# Patient Record
Sex: Male | Born: 1950 | Race: White | Hispanic: No | Marital: Married | Smoking: Former smoker
Health system: Southern US, Community
[De-identification: ages and names within clinical notes are randomized; demographics above are authoritative.]

## PROBLEM LIST (undated history)

## (undated) DIAGNOSIS — E785 Hyperlipidemia, unspecified: Secondary | ICD-10-CM

## (undated) DIAGNOSIS — K5790 Diverticulosis of intestine, part unspecified, without perforation or abscess without bleeding: Secondary | ICD-10-CM

## (undated) DIAGNOSIS — K635 Polyp of colon: Secondary | ICD-10-CM

## (undated) DIAGNOSIS — M1990 Unspecified osteoarthritis, unspecified site: Secondary | ICD-10-CM

## (undated) DIAGNOSIS — N529 Male erectile dysfunction, unspecified: Secondary | ICD-10-CM

## (undated) DIAGNOSIS — K219 Gastro-esophageal reflux disease without esophagitis: Secondary | ICD-10-CM

## (undated) DIAGNOSIS — I213 ST elevation (STEMI) myocardial infarction of unspecified site: Secondary | ICD-10-CM

## (undated) DIAGNOSIS — L821 Other seborrheic keratosis: Secondary | ICD-10-CM

## (undated) HISTORY — DX: Diverticulosis of intestine, part unspecified, without perforation or abscess without bleeding: K57.90

## (undated) HISTORY — DX: Gastro-esophageal reflux disease without esophagitis: K21.9

## (undated) HISTORY — PX: NOSE SURGERY: SHX723

## (undated) HISTORY — DX: Other seborrheic keratosis: L82.1

## (undated) HISTORY — PX: OTHER SURGICAL HISTORY: SHX169

## (undated) HISTORY — DX: Polyp of colon: K63.5

## (undated) HISTORY — DX: Unspecified osteoarthritis, unspecified site: M19.90

## (undated) HISTORY — DX: Hyperlipidemia, unspecified: E78.5

## (undated) HISTORY — DX: Male erectile dysfunction, unspecified: N52.9

---
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
## 2002-01-31 ENCOUNTER — Encounter: Admission: RE | Admit: 2002-01-31 | Discharge: 2002-01-31 | Payer: Self-pay | Admitting: *Deleted

## 2008-11-16 ENCOUNTER — Ambulatory Visit: Payer: Self-pay | Admitting: Diagnostic Radiology

## 2008-11-16 ENCOUNTER — Ambulatory Visit (HOSPITAL_BASED_OUTPATIENT_CLINIC_OR_DEPARTMENT_OTHER): Admission: RE | Admit: 2008-11-16 | Discharge: 2008-11-16 | Payer: Self-pay | Admitting: Family Medicine

## 2008-11-16 IMAGING — CR DG CHEST 2V
2 series · 2 of 2 positions shown · non-contrast
Comparison: None available.

CLINICAL DATA: 58-year-old male with shortness of breath.
Hyperlipidemia.  Smoker.

CHEST - 2 VIEW

[w chest pa]
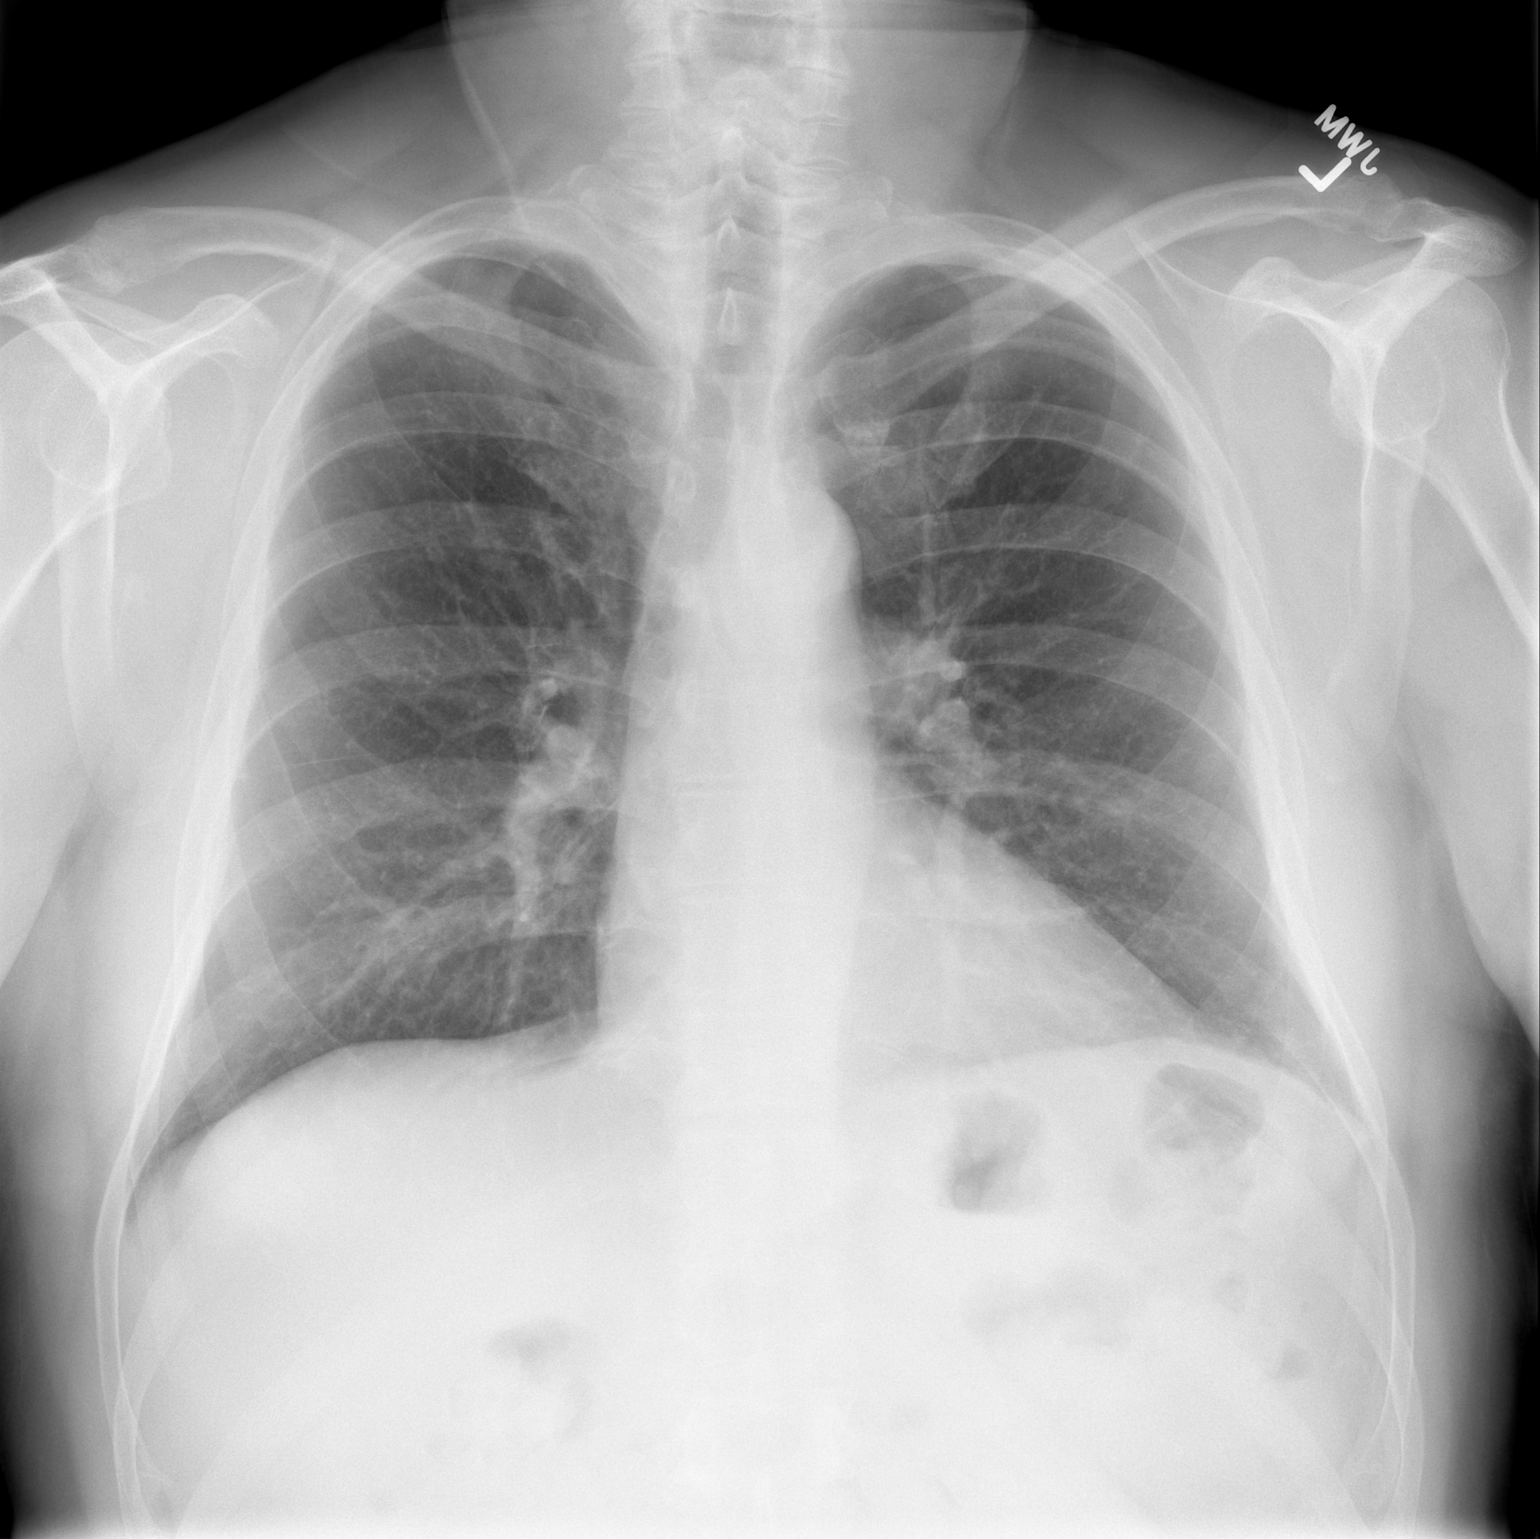

[w chest lat]
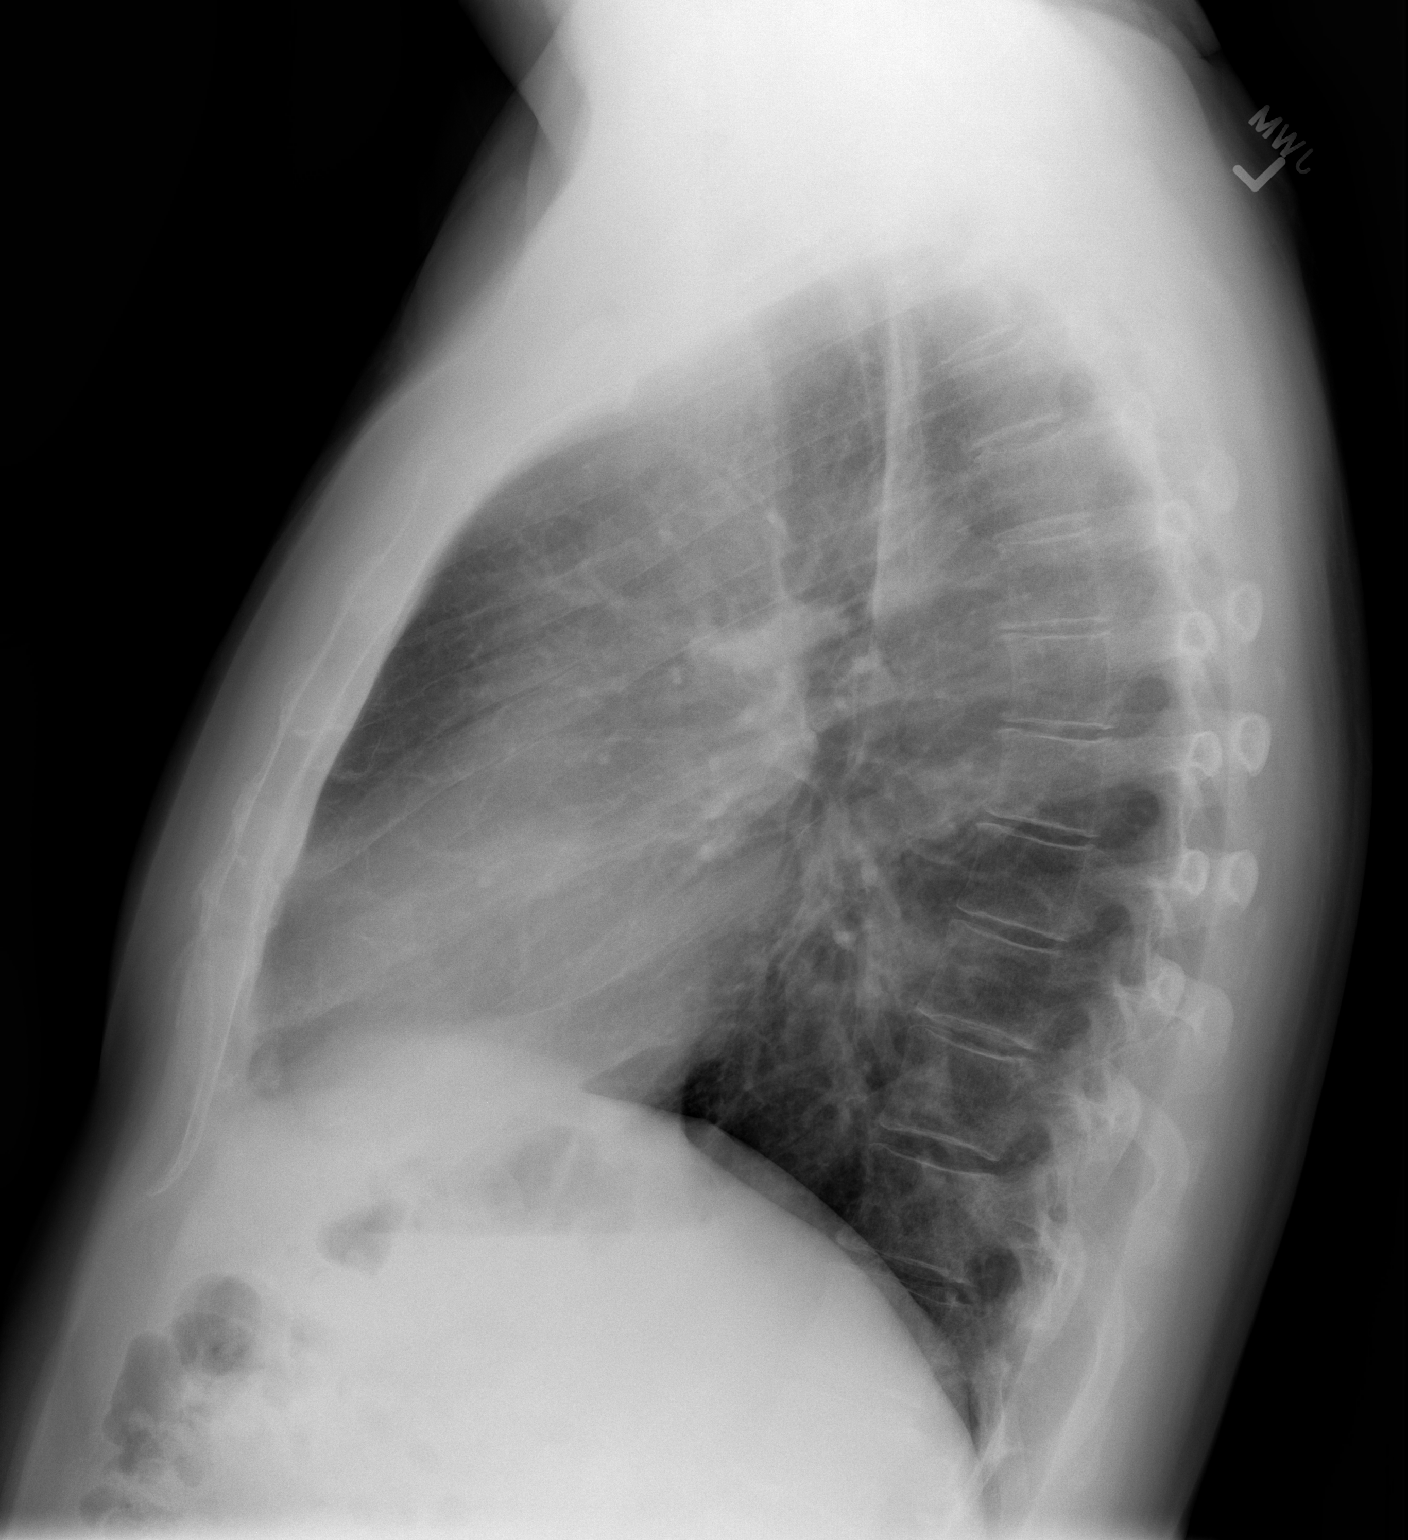

[2 of 2 positions shown; findings below may reference images not displayed]

FINDINGS: Mild diffuse increased interstitial markings.  Lung
volumes at the lower limits of normal.  No pneumothorax, pulmonary
edema, pleural effusion, or focal airspace opacity. Visualized
tracheal air column is within normal limits.   Cardiac size and
mediastinal contours are within normal limits.  Overlapping bony
shadows at the left sternoclavicular joint.  No lung mass
identified. No acute osseous abnormality identified.
IMPRESSION: Mild diffuse interstitial markings could reflect sequelae of
smoking.  Otherwise, no acute cardiopulmonary abnormality.

## 2010-01-15 ENCOUNTER — Emergency Department (HOSPITAL_BASED_OUTPATIENT_CLINIC_OR_DEPARTMENT_OTHER)
Admission: EM | Admit: 2010-01-15 | Discharge: 2010-01-15 | Payer: Self-pay | Source: Home / Self Care | Admitting: Emergency Medicine

## 2010-01-15 IMAGING — CT CT ABD-PELV W/ CM
2 of 5 series · 16 of 46 positions shown, 18 images · IV contrast (APPLIED)
Comparison: None.

CLINICAL DATA: Diarrhea.  Blood in the stool.

CT ABDOMEN AND PELVIS WITH CONTRAST
TECHNIQUE: Multidetector CT imaging of the abdomen and pelvis was
performed following the standard protocol during bolus
administration of intravenous contrast.
Contrast: 100 ml [AR]

[Series 2: abd/pelvis 5.0 b31f · axial · 0.78mm/px · z∈[-479,-24]mm · 13 of 103 slices shown, 15 images]
[im 6/103  soft-tissue]
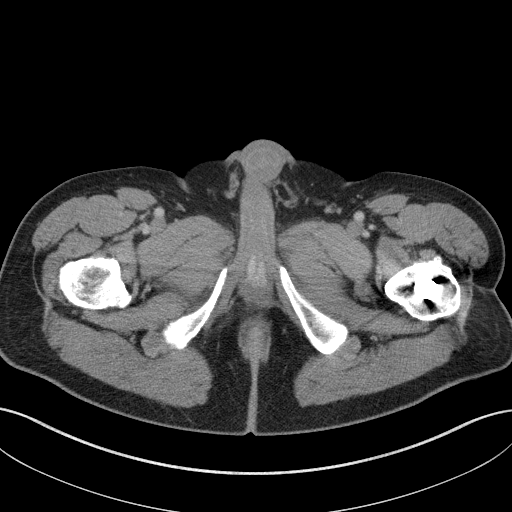
[im 6/103  bone]
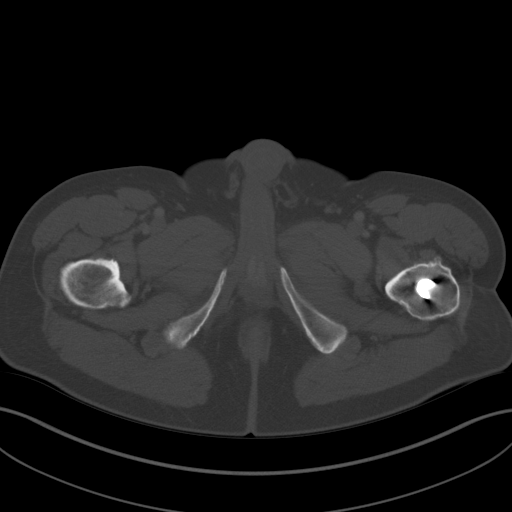
[im 17/103  soft-tissue]
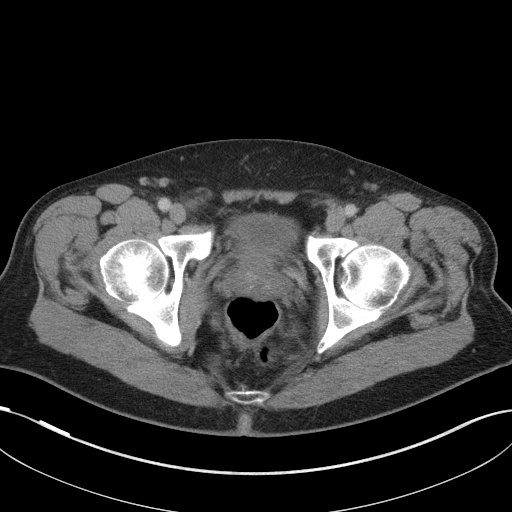
[im 22/103  soft-tissue]
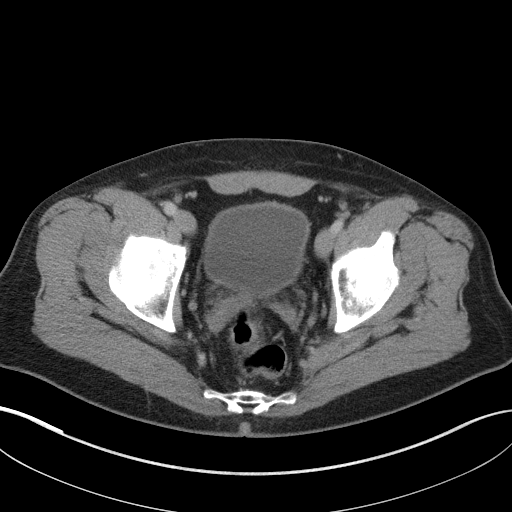
[im 27/103  soft-tissue]
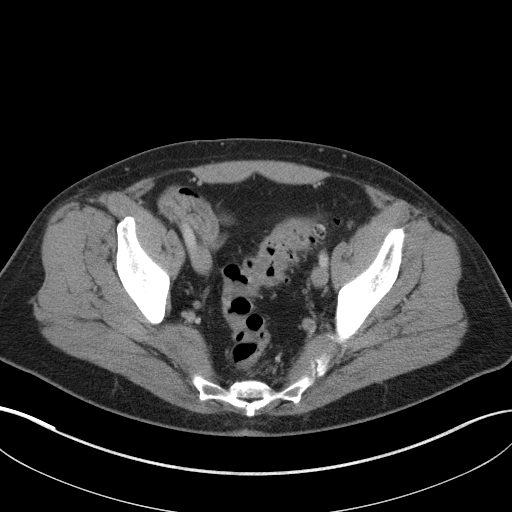
[im 38/103  soft-tissue]
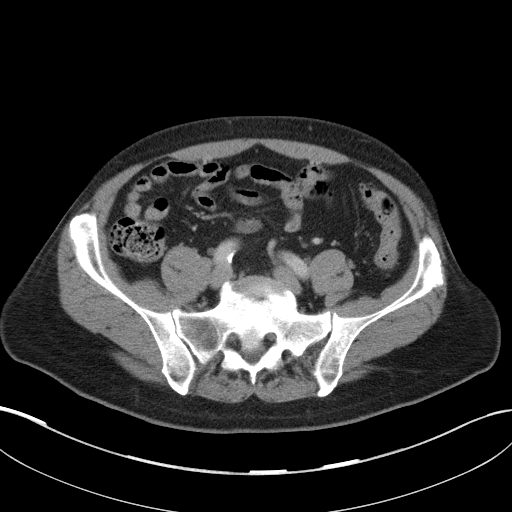
[im 43/103  soft-tissue]
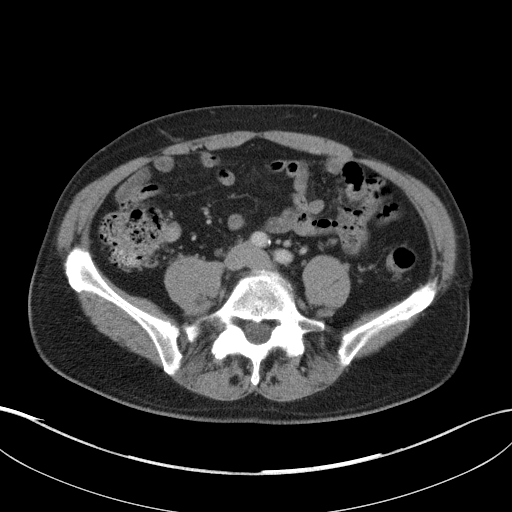
[im 54/103  soft-tissue]
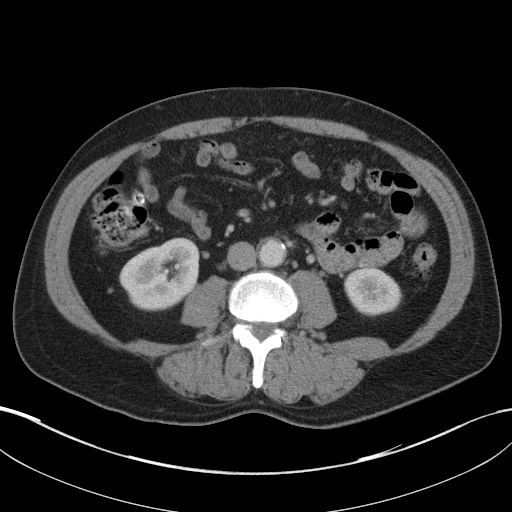
[im 60/103  soft-tissue]
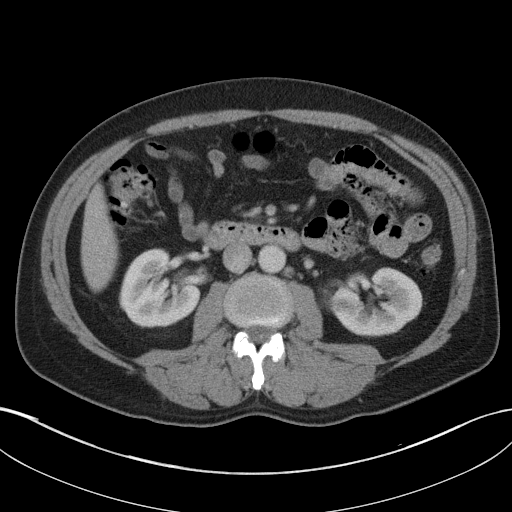
[im 65/103  soft-tissue]
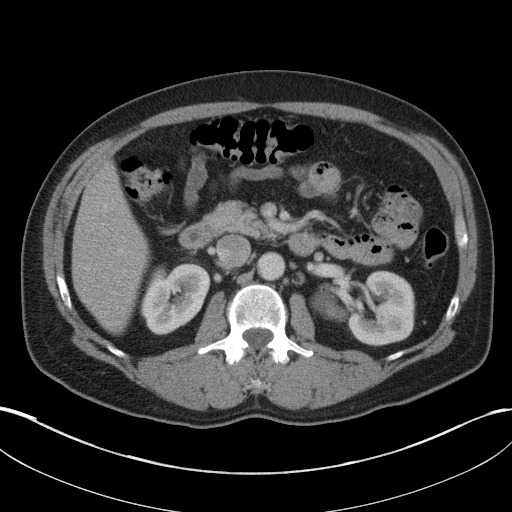
[im 65/103  bone]
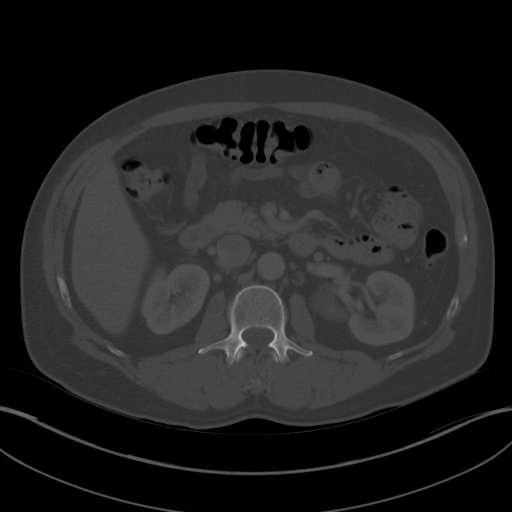
[im 76/103  soft-tissue]
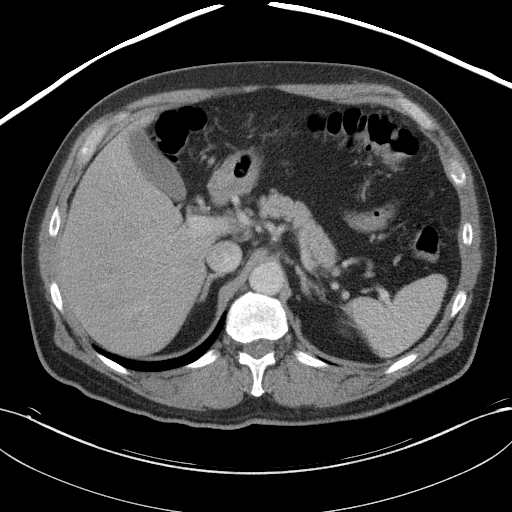
[im 81/103  soft-tissue]
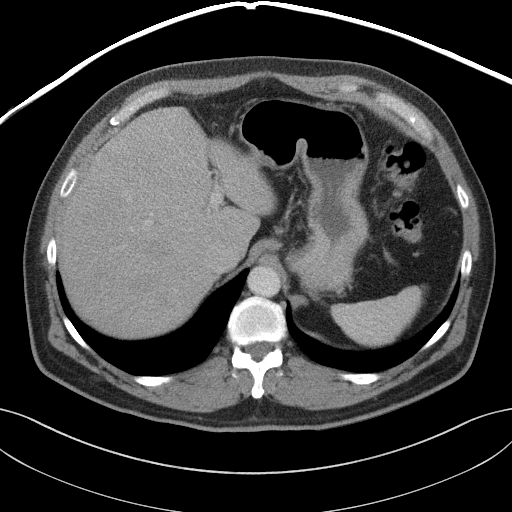
[im 86/103  soft-tissue]
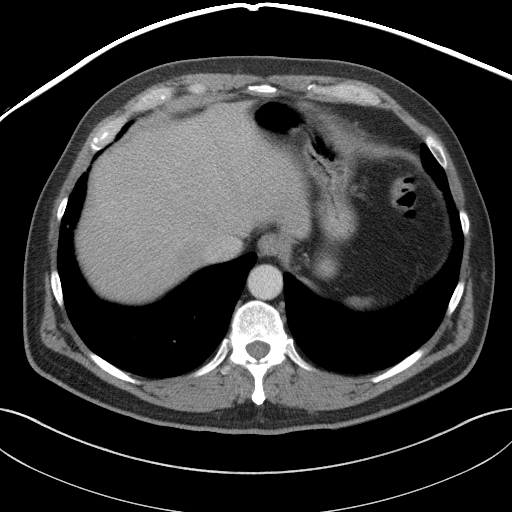
[im 97/103  soft-tissue]
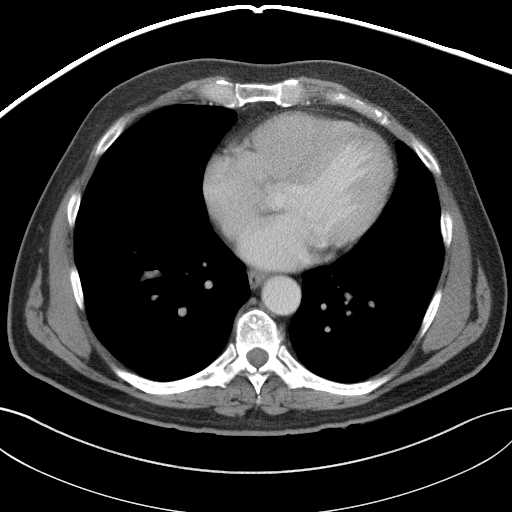

[Series 4: abd/pelvis 3.0 coronal · coronal · 0.78mm/px · 3 of 76 slices shown]
[im 26/76  soft-tissue]
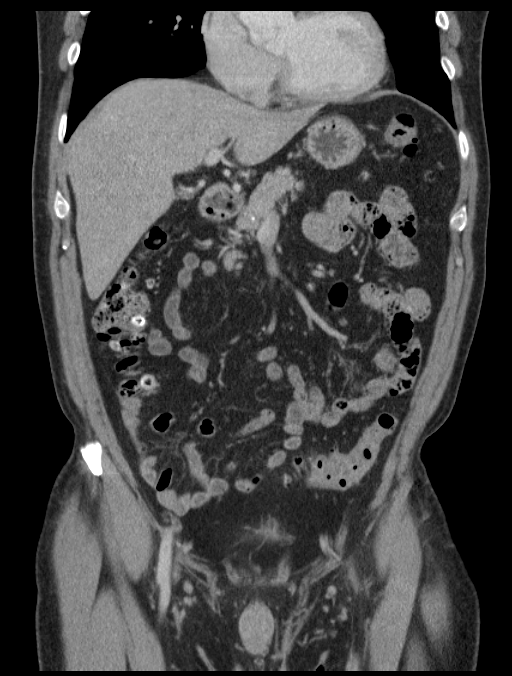
[im 34/76  soft-tissue]
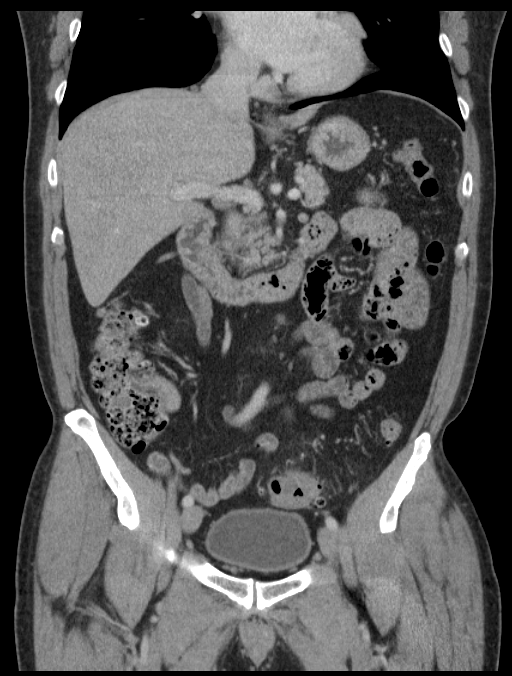
[im 42/76  soft-tissue]
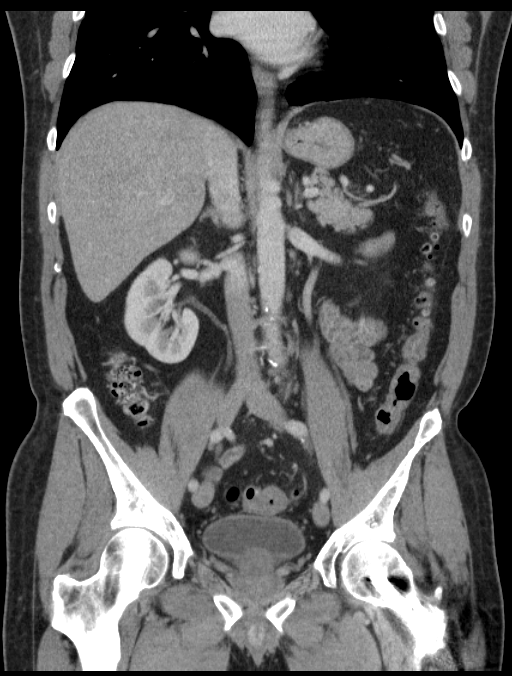

[16 of 46 positions shown; findings below may reference images not displayed]

FINDINGS: Numerous diverticuli throughout the colon, most extensive
in the sigmoid and ascending portions.  There is slight thickening
of the mucosa in the mid sigmoid region suggesting mild
diverticulitis although there is no pericolonic soft tissue
inflammation.

The liver and spleen are normal.  There are a few tiny
calcifications in the head of the pancreas suggesting prior
pancreatitis.

The adrenal glands and kidneys are normal except for a benign
appearing 3 cm cyst on the medial aspect of the midportion of the
left kidney.

The terminal ileum and appendix are normal.  There is no free air
or free fluid in the abdomen.  No acute osseous abnormalities.
IMPRESSION: 1.  Extensive diverticulosis with slight prominence of the mucosa
of the sigmoid portion the colon which could represent early
diverticulitis.
2.  Several tiny calcifications in the pancreatic head which could
represent sequela from previous pancreatitis.

## 2010-04-01 LAB — CBC
HCT: 46.1 % (ref 39.0–52.0)
Hemoglobin: 15.5 g/dL (ref 13.0–17.0)
MCH: 31.8 pg (ref 26.0–34.0)
MCHC: 33.6 g/dL (ref 30.0–36.0)
MCV: 94.7 fL (ref 78.0–100.0)
Platelets: 183 10*3/uL (ref 150–400)
RBC: 4.87 MIL/uL (ref 4.22–5.81)
RDW: 12.7 % (ref 11.5–15.5)
WBC: 9.4 10*3/uL (ref 4.0–10.5)

## 2010-04-01 LAB — URINALYSIS, ROUTINE W REFLEX MICROSCOPIC
Bilirubin Urine: NEGATIVE
Leukocytes, UA: NEGATIVE
Nitrite: NEGATIVE
Specific Gravity, Urine: 1.008 (ref 1.005–1.030)
Urobilinogen, UA: 0.2 mg/dL (ref 0.0–1.0)

## 2010-04-01 LAB — DIFFERENTIAL
Basophils Absolute: 0 10*3/uL (ref 0.0–0.1)
Basophils Relative: 0 % (ref 0–1)
Eosinophils Absolute: 0.1 10*3/uL (ref 0.0–0.7)
Eosinophils Relative: 1 % (ref 0–5)
Lymphocytes Relative: 26 % (ref 12–46)
Lymphs Abs: 2.5 10*3/uL (ref 0.7–4.0)
Monocytes Absolute: 0.9 10*3/uL (ref 0.1–1.0)
Monocytes Relative: 9 % (ref 3–12)
Neutro Abs: 6 10*3/uL (ref 1.7–7.7)
Neutrophils Relative %: 64 % (ref 43–77)

## 2010-04-01 LAB — COMPREHENSIVE METABOLIC PANEL
ALT: 21 U/L (ref 0–53)
AST: 19 U/L (ref 0–37)
Albumin: 4 g/dL (ref 3.5–5.2)
Alkaline Phosphatase: 58 U/L (ref 39–117)
BUN: 14 mg/dL (ref 6–23)
CO2: 27 mEq/L (ref 19–32)
Calcium: 8.7 mg/dL (ref 8.4–10.5)
Chloride: 108 mEq/L (ref 96–112)
Creatinine, Ser: 0.8 mg/dL (ref 0.4–1.5)
GFR calc Af Amer: >60 mL/min (ref >60)
GFR calc non Af Amer: >60 mL/min (ref >60)
Glucose, Bld: 90 mg/dL (ref 70–99)
Potassium: 3.9 mEq/L (ref 3.5–5.1)
Sodium: 143 mEq/L (ref 135–145)
Total Bilirubin: 0.9 mg/dL (ref 0.3–1.2)
Total Protein: 6.7 g/dL (ref 6.0–8.3)

## 2010-04-01 LAB — HEMOCCULT GUIAC POC 1CARD (OFFICE): Fecal Occult Bld: POSITIVE

## 2010-04-01 LAB — URINE MICROSCOPIC-ADD ON

## 2011-09-30 ENCOUNTER — Encounter: Payer: Self-pay | Admitting: *Deleted

## 2011-09-30 ENCOUNTER — Encounter: Payer: Self-pay | Admitting: Cardiology

## 2011-09-30 DIAGNOSIS — L821 Other seborrheic keratosis: Secondary | ICD-10-CM | POA: Insufficient documentation

## 2011-09-30 DIAGNOSIS — E785 Hyperlipidemia, unspecified: Secondary | ICD-10-CM | POA: Insufficient documentation

## 2011-09-30 DIAGNOSIS — K5790 Diverticulosis of intestine, part unspecified, without perforation or abscess without bleeding: Secondary | ICD-10-CM | POA: Insufficient documentation

## 2011-09-30 DIAGNOSIS — M542 Cervicalgia: Secondary | ICD-10-CM | POA: Insufficient documentation

## 2011-09-30 DIAGNOSIS — M1990 Unspecified osteoarthritis, unspecified site: Secondary | ICD-10-CM | POA: Insufficient documentation

## 2011-09-30 DIAGNOSIS — N529 Male erectile dysfunction, unspecified: Secondary | ICD-10-CM | POA: Insufficient documentation

## 2011-09-30 DIAGNOSIS — K219 Gastro-esophageal reflux disease without esophagitis: Secondary | ICD-10-CM | POA: Insufficient documentation

## 2011-09-30 DIAGNOSIS — J309 Allergic rhinitis, unspecified: Secondary | ICD-10-CM | POA: Insufficient documentation

## 2011-09-30 DIAGNOSIS — K635 Polyp of colon: Secondary | ICD-10-CM | POA: Insufficient documentation

## 2011-10-01 ENCOUNTER — Encounter: Payer: Self-pay | Admitting: Cardiology

## 2011-10-01 ENCOUNTER — Ambulatory Visit (INDEPENDENT_AMBULATORY_CARE_PROVIDER_SITE_OTHER): Payer: Managed Care, Other (non HMO) | Admitting: Cardiology

## 2011-10-01 ENCOUNTER — Ambulatory Visit: Payer: Self-pay | Admitting: Cardiology

## 2011-10-01 VITALS — BP 122/60 | HR 49 | Ht 71.0 in | Wt 212.0 lb

## 2011-10-01 DIAGNOSIS — F17200 Nicotine dependence, unspecified, uncomplicated: Secondary | ICD-10-CM

## 2011-10-01 DIAGNOSIS — Z720 Tobacco use: Secondary | ICD-10-CM | POA: Insufficient documentation

## 2011-10-01 DIAGNOSIS — I498 Other specified cardiac arrhythmias: Secondary | ICD-10-CM

## 2011-10-01 DIAGNOSIS — R001 Bradycardia, unspecified: Secondary | ICD-10-CM

## 2011-10-01 DIAGNOSIS — E785 Hyperlipidemia, unspecified: Secondary | ICD-10-CM

## 2011-10-01 NOTE — Patient Instructions (Addendum)
Your physician recommends that you schedule a follow-up appointment in: AS NEEDED PENDING TEST RESULTS  Your physician has requested that you have an exercise tolerance test. For further information please visit [email or web address] Please also follow instruction sheet, as given.    Exercise Stress Electrocardiography An exercise stress test is a heart test (EKG) which is done while you are moving. You will walk on a treadmill. This test will tell your doctor how your heart does when it is forced to work harder and how much activity you can safely handle. BEFORE THE TEST  Wear shorts or athletic pants.   Wear comfortable tennis shoes.   Women need to wear a bra that allows patches to be put on under it.  TEST  An EKG cable will be attached to your waist. This cable is hooked up to patches, which look like round stickers stuck to your chest.   You will be asked to walk on the treadmill.   You will walk until you are too tired or until you are told to stop.   Tell the doctor right away if you have:   Chest pain.   Leg cramps.   Shortness of breath.   Dizziness.   The test may last 30 minutes to 1 hour. The timing depends on your physical condition and the condition of your heart.  AFTER THE TEST  You will rest for about 6 minutes. During this time, your heart rhythm and blood pressure will be checked.   The testing equipment will be removed from your body and you can get dressed.   You may go home or back to your hospital room. You may keep doing all your usual activities as told by your doctor.

## 2011-10-01 NOTE — Assessment & Plan Note (Signed)
Patient has no symptoms from his bradycardia. There is no chest pain, dyspnea or fatigue. No history of syncope. No further therapy indicated. I will arrange an exercise treadmill given his risk factors. I would like to risk stratify as he plans to begin an exercise program in the near future. This will also demonstrate chronotropic competence.

## 2011-10-01 NOTE — Progress Notes (Signed)
  HPI: 61 year old male with no prior cardiac history for evaluation of bradycardia. Laboratories on 08/25/2011 showed a potassium of 4.8 and normal renal function. TSH normal at 0.734. Patient has been noted to be bradycardic for quite some time. However he denies dyspnea on exertion, orthopnea, PND, pedal edema, palpitations, syncope or chest pain. Patient denies fatigue.  Current Outpatient Prescriptions  Medication Sig Dispense Refill  . aspirin 81 MG tablet Take 81 mg by mouth daily.      . Cholecalciferol (VITAMIN D) 2000 UNITS CAPS Take 1 capsule by mouth daily.      Marland Kitchen HYDROcodone-acetaminophen (NORCO/VICODIN) 5-325 MG per tablet prn      . neomycin-polymyxin-hydrocortisone (CORTISPORIN) otic solution prn      . simvastatin (ZOCOR) 80 MG tablet Take 40 mg by mouth at bedtime.      . TESTOSTERONE IM Inject into the muscle. Every 2 weeks      . VERAMYST 27.5 MCG/SPRAY nasal spray prn        Allergies  Allergen Reactions  . Penicillins     Past Medical History  Diagnosis Date  . Diverticular disease   . Hyperlipidemia   . Allergic rhinitis   . Allergic sinusitis   . GERD (gastroesophageal reflux disease)   . Seborrheic keratosis   . Osteoarthritis   . Erectile dysfunction   . Colon polyp     Past Surgical History  Procedure Date  . Leg surgery   . Athroscopic knee surgery   . Nose surgery     History   Social History  . Marital Status: Married    Spouse Name: N/A    Number of Children: 2  . Years of Education: N/A   Occupational History  .      Retired   Social History Main Topics  . Smoking status: Current Every Day Smoker  . Smokeless tobacco: Not on file  . Alcohol Use: Yes     Occasional  . Drug Use: Not on file  . Sexually Active: Not on file   Other Topics Concern  . Not on file   Social History Narrative  . No narrative on file    Family History  Problem Relation Age of Onset  . Coronary artery disease Brother     MI    ROS:   Arthralgias and back pain but no fevers or chills, productive cough, hemoptysis, dysphasia, odynophagia, melena, hematochezia, dysuria, hematuria, rash, seizure activity, orthopnea, PND, pedal edema, claudication. Remaining systems are negative.  Physical Exam:  Blood pressure 122/60, pulse 49, height 5\' 11"  (1.803 m), weight 212 lb (96.163 kg).  General:  Well developed/well nourished in NAD Skin warm/dry Patient not depressed No peripheral clubbing Back-normal HEENT-normal/normal eyelids Neck supple/normal carotid upstroke bilaterally; no bruits; no JVD; no thyromegaly chest - CTA/ normal expansion CV - bradycardic but regular rhythm/normal S1 and S2; no murmurs, rubs or gallops;  PMI nondisplaced Abdomen -NT/ND, no HSM, no mass, + bowel sounds, no bruit 2+ femoral pulses, no bruits Ext-no edema, chords, 2+ DP Neuro-grossly nonfocal  ECG 09/15/2011-sinus bradycardia with RV conduction delay. Short PR interval. Repeat ECG today shows sinus rhythm at a rate of 49. Left ventricular hypertrophy.

## 2011-10-01 NOTE — Assessment & Plan Note (Signed)
Management per primary care. 

## 2011-10-01 NOTE — Assessment & Plan Note (Signed)
Patient counseled on discontinuing. 

## 2011-10-03 ENCOUNTER — Encounter: Payer: Self-pay | Admitting: Cardiology

## 2011-10-27 ENCOUNTER — Ambulatory Visit (INDEPENDENT_AMBULATORY_CARE_PROVIDER_SITE_OTHER): Payer: Managed Care, Other (non HMO) | Admitting: Physician Assistant

## 2011-10-27 ENCOUNTER — Encounter: Payer: Self-pay | Admitting: Physician Assistant

## 2011-10-27 DIAGNOSIS — R001 Bradycardia, unspecified: Secondary | ICD-10-CM

## 2011-10-27 DIAGNOSIS — I498 Other specified cardiac arrhythmias: Secondary | ICD-10-CM

## 2011-10-27 NOTE — Procedures (Signed)
Exercise Treadmill Test  Pre-Exercise Testing Evaluation Rhythm: sinus bradycardia  Rate: 48   PR:  .13 QRS:  .09  QT:  .41 QTc: .36     Test  Exercise Tolerance Test Ordering MD: Olga Millers, MD  Interpreting MD: Tereso Newcomer , PA-C  Unique Test No: 1  Treadmill:  1  Indication for ETT: Bradycardia  Contraindication to ETT: No   Stress Modality: exercise - treadmill  Cardiac Imaging Performed: non   Protocol: standard Bruce - maximal  Max BP: 189/63  Max MPHR (bpm):  157 85% MPR (bpm):  133  MPHR obtained (bpm):  133 % MPHR obtained:  85%  Reached 85% MPHR (min:sec):  6:54 Total Exercise Time (min-sec):  6:54  Workload in METS:  133 Borg Scale: 17  Reason ETT Terminated:  fatigue    ST Segment Analysis At Rest: normal ST segments - no evidence of significant ST depression With Exercise: Insignificant Up-sloping ST depression  Other Information Arrhythmia:  No Angina during ETT:  absent (0) Quality of ETT:  diagnostic  ETT Interpretation:  normal - no evidence of ischemia by ST analysis  Comments: Fair exercise tolerance. No chest pain. Normal BP response to exercise. No significant ST-T changes to suggest ischemia.  Appropriate HR response to exercise.  Recommendations: Follow up with Dr. Olga Millers as directed. Tereso Newcomer, PA-C  9:11 AM 10/27/2011

## 2012-09-17 ENCOUNTER — Other Ambulatory Visit: Payer: Self-pay | Admitting: *Deleted

## 2012-09-17 DIAGNOSIS — Z0000 Encounter for general adult medical examination without abnormal findings: Secondary | ICD-10-CM

## 2012-09-21 ENCOUNTER — Other Ambulatory Visit: Payer: Managed Care, Other (non HMO)

## 2012-09-22 LAB — COMPLETE METABOLIC PANEL WITH GFR
ALT: 29 U/L (ref 0–53)
AST: 18 U/L (ref 0–37)
Albumin: 4.5 g/dL (ref 3.5–5.2)
Alkaline Phosphatase: 55 U/L (ref 39–117)
BUN: 19 mg/dL (ref 6–23)
CO2: 22 mEq/L (ref 19–32)
Calcium: 9.1 mg/dL (ref 8.4–10.5)
Chloride: 107 mEq/L (ref 96–112)
Creat: 1.07 mg/dL (ref 0.50–1.35)
GFR, Est African American: 84 mL/min
GFR, Est Non African American: 73 mL/min
Glucose, Bld: 104 mg/dL — ABNORMAL HIGH (ref 70–99)
Potassium: 4.7 mEq/L (ref 3.5–5.3)
Sodium: 138 mEq/L (ref 135–145)
Total Bilirubin: 0.7 mg/dL (ref 0.3–1.2)
Total Protein: 6.7 g/dL (ref 6.0–8.3)

## 2012-09-22 LAB — LIPID PANEL
Cholesterol: 153 mg/dL (ref 0–200)
HDL: 49 mg/dL (ref >39)
LDL Cholesterol: 90 mg/dL (ref 0–99)
Total CHOL/HDL Ratio: 3.1 Ratio
Triglycerides: 71 mg/dL (ref <150)
VLDL: 14 mg/dL (ref 0–40)

## 2012-09-22 LAB — CBC WITH DIFFERENTIAL/PLATELET
Basophils Absolute: 0 10*3/uL (ref 0.0–0.1)
Basophils Relative: 1 % (ref 0–1)
Eosinophils Absolute: 0.1 10*3/uL (ref 0.0–0.7)
Eosinophils Relative: 1 % (ref 0–5)
HCT: 45.8 % (ref 39.0–52.0)
Hemoglobin: 15.8 g/dL (ref 13.0–17.0)
Lymphocytes Relative: 31 % (ref 12–46)
Lymphs Abs: 1.7 10*3/uL (ref 0.7–4.0)
MCH: 31.9 pg (ref 26.0–34.0)
MCHC: 34.5 g/dL (ref 30.0–36.0)
MCV: 92.3 fL (ref 78.0–100.0)
Monocytes Absolute: 0.4 10*3/uL (ref 0.1–1.0)
Monocytes Relative: 7 % (ref 3–12)
Neutro Abs: 3.4 10*3/uL (ref 1.7–7.7)
Neutrophils Relative %: 60 % (ref 43–77)
Platelets: 209 10*3/uL (ref 150–400)
RBC: 4.96 MIL/uL (ref 4.22–5.81)
RDW: 14 % (ref 11.5–15.5)
WBC: 5.7 10*3/uL (ref 4.0–10.5)

## 2012-09-22 LAB — TSH: TSH: 0.581 u[IU]/mL (ref 0.350–4.500)

## 2012-09-29 ENCOUNTER — Encounter: Payer: Self-pay | Admitting: Family Medicine

## 2012-09-29 ENCOUNTER — Ambulatory Visit (INDEPENDENT_AMBULATORY_CARE_PROVIDER_SITE_OTHER): Payer: Managed Care, Other (non HMO) | Admitting: Family Medicine

## 2012-09-29 VITALS — BP 119/75 | HR 59 | Resp 16 | Ht 71.0 in | Wt 216.0 lb

## 2012-09-29 DIAGNOSIS — Z720 Tobacco use: Secondary | ICD-10-CM

## 2012-09-29 DIAGNOSIS — J309 Allergic rhinitis, unspecified: Secondary | ICD-10-CM

## 2012-09-29 DIAGNOSIS — F17200 Nicotine dependence, unspecified, uncomplicated: Secondary | ICD-10-CM

## 2012-09-29 DIAGNOSIS — E785 Hyperlipidemia, unspecified: Secondary | ICD-10-CM

## 2012-09-29 DIAGNOSIS — N400 Enlarged prostate without lower urinary tract symptoms: Secondary | ICD-10-CM

## 2012-09-29 DIAGNOSIS — Z23 Encounter for immunization: Secondary | ICD-10-CM

## 2012-09-29 MED ORDER — TAMSULOSIN HCL 0.4 MG PO CAPS: ORAL_CAPSULE | ORAL | Status: DC

## 2012-09-29 MED ORDER — SIMVASTATIN 80 MG PO TABS: ORAL_TABLET | ORAL | Status: DC

## 2012-09-29 MED ORDER — FLUTICASONE PROPIONATE 50 MCG/ACT NA SUSP: 2.0000 | Freq: Every day | NASAL | Status: DC

## 2012-09-29 NOTE — Progress Notes (Signed)
  Subjective:    Patient ID: Alec Johnson, male    DOB: 1950-10-11, 62 y.o.   MRN: [id number]  HPI  Alec Johnson is here today to go over his most recent lab results, discuss the conditions listed below and to get refills on his medications.  1)  Hyperlipidemia:  He is doing well taking 1/2 pill of his simvastatin 80 mg and needs a refill on it.    2)  Low Testosterone:  He developed severe skin allergy to the testosterone injection and stop them.  He also stopped his Cialis 5 mg because it did not help with his urinary frequency.    3)  Allergic Rhinitis:  He needs a refill for his nasal spray.     Review of Systems  Constitutional: Negative.   HENT: Negative.   Eyes: Negative.   Respiratory: Negative.   Cardiovascular: Negative.   Gastrointestinal: Negative.   Endocrine: Negative.   Genitourinary: Negative.   Musculoskeletal: Negative.   Skin: Negative.   Allergic/Immunologic: Negative.   Neurological: Negative.   Hematological: Negative.   Psychiatric/Behavioral: Negative.     Past Medical History  Diagnosis Date  . Diverticular disease   . Hyperlipidemia   . Allergic rhinitis   . Allergic sinusitis   . GERD (gastroesophageal reflux disease)   . Seborrheic keratosis   . Osteoarthritis   . Erectile dysfunction   . Colon polyp     Family History  Problem Relation Age of Onset  . 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     Education:  [employer]    Tobacco Use/Exposure: He has quit smoking.        Alcohol Use: Occasional    Drug Use:  None   Diet:  Regular   Exercise:  He was walking daily until he hurt his hip.     Hobbies:  Fishing, Gardening, Golf       Objective:   Physical Exam  Vitals  reviewed. Constitutional: He is oriented to person, place, and time. He appears well-developed and well-nourished.  Cardiovascular: Normal rate and regular rhythm.   Pulmonary/Chest: Effort normal and breath sounds normal.  Neurological: He is alert and oriented to person, place, and time.  Skin: Skin is warm and dry.  Psychiatric: He has a normal mood and affect.     Assessment & Plan:

## 2012-09-29 NOTE — Patient Instructions (Addendum)
1)  Prostate - Try the Flomax .4 mg (1-2) 1/2 hour after supper.  Schedule an appointment with Dr. Patsi Sears.   2)  Smoking - CONGRATULATIONS ON YOUR QUITTING SMOKING!! Let us know if you want to get the Chest CT.     Benign Prostatic Hypertrophy  The prostate gland is part of the reproductive system of men. A normal prostate is about the size and shape of a walnut. The prostate gland makes a fluid that is mixed with sperm to make semen. This gland surrounds the urethra and is located in front of the rectum and just below the bladder. The bladder is where urine is stored. The urethra is the tube through which urine passes from the bladder to get out of the body. The prostate grows as a man ages. An enlarged prostate not caused by cancer is called benign prostatic hypertrophy (BPH). This is a common health problem in men over age 41. This condition is a normal part of aging. An enlarged prostate presses on the urethra. This makes it harder to pass urine. In the early stages of enlargement, the bladder can get by with a narrowed urethra by forcing the urine through. If the problem gets worse, medical or surgical treatment may be required.  This condition should be followed by your caregiver. Longstanding back pressure on the kidneys can cause infection. Back pressure and infection can progress to bladder damage and kidney (renal) failure. If needed, your caregiver may refer you to a specialist in kidney and prostate disease (urologist). CAUSES  The exact cause is not known.  SYMPTOMS   You are not able to completely empty your bladder.  Getting up often during the night to urinate.  Need to urinate frequently during the day.  Difficultly in starting urine flow.  Decrease in size and strength of the urine stream.  Dribbling after urination.  Pain on urination (more common with infection).  Inability to pass your water. This needs immediate treatment. DIAGNOSIS  These tests will help your  caregiver understand your problem:  Digital rectal exam (DRE). In a rectal exam, your caregiver checks your prostate by putting a gloved, lubricated finger into the rectum to feel the back of your prostate gland. This exam detects the size of the gland and abnormal lumps or growths.  Urinalysis (exam of the urine). This may include a culture if there is concern about infection.  Prostate Specific Antigen (PSA). This is a blood test used to screen for prostate cancer. It is not used alone for diagnosing prostate cancer.  Rectal ultrasound (sonogram). This test uses sound waves to electronically produce a "picture" of the prostate. It helps examine the prostate gland for cancer. TREATMENT  Mild symptoms may not need treatment. Simple observation and yearly exams may be all that is required. Medications and surgery are options for more severe problems. Your caregiver can help you make an informed decision for what is best. Two classes of medications are available for relief of prostate symptoms:  Medications that shrink the prostate. This helps relieve symptoms.  Uncommon side effects include problems with sexual function.  Medications to relax the muscle of the prostate. This also relieves the obstruction.  Side effects can include dizziness, fatigue, lightheadedness, and retrograde ejaculation (diminished volume of ejaculate). Several types of surgical treatments are available for relief of prostate symptoms:  Transurethral resection of the prostate (TURP). In this treatment, an instrument is inserted through opening at the tip of the penis. It is used to  cut away pieces of the inner core of the prostate. The pieces are removed through the same opening of the penis. This removes the obstruction and helps get rid of the symptoms.  Transurethral incision (TUIP). In this procedure, small cuts are made in the prostate. This lessens the prostates pressure on the urethra.  Transurethral microwave  thermotherapy (TUMT). This procedure uses microwaves to create heat. The heat destroys and removes a small amount of prostate tissue.  Transurethral needle ablation (TUNA). This is a procedure that uses radio frequencies to do the same as TUMT.  Interstitial laser coagulation (ILC). This is a procedure that uses a laser to do the same as TUMT and TUNA.  Transurethral electrovaporization (TUVP). This is a procedure that uses electrodes to do the same as the procedures listed above. Regardless of the method of treatment chosen, you and your caregiver will discuss the options. With this knowledge, you along with your caregiver can decide upon the best treatment for you. SEEK MEDICAL CARE IF:   You develop chills, fever of 100.5 F (38.1 C), or night sweats.  There is unexplained back pain.  Symptoms are not helped by medications prescribed.  You develop medication side effects.  Your urine becomes very dark or has a bad smell. SEEK IMMEDIATE MEDICAL CARE IF:   You are suddenly unable to urinate. This is an emergency. You should be seen immediately.  There are large amounts of blood or clots in the urine.  Your urinary problems become unmanageable.  You develop lightheadedness, severe dizziness, or you feel faint.  You develop moderate to severe low back or flank pain.  You develop chills or fever. Document Released: 01/06/2005 Document Revised: 03/31/2011 Document Reviewed: 09/28/2006 [facility] Patient Information 2014 [place], [place].

## 2012-10-12 ENCOUNTER — Encounter: Payer: Managed Care, Other (non HMO) | Admitting: Family Medicine

## 2012-11-15 ENCOUNTER — Ambulatory Visit (INDEPENDENT_AMBULATORY_CARE_PROVIDER_SITE_OTHER): Payer: Managed Care, Other (non HMO) | Admitting: Internal Medicine

## 2012-11-15 ENCOUNTER — Encounter: Payer: Self-pay | Admitting: Internal Medicine

## 2012-11-15 VITALS — BP 130/74 | HR 51 | Temp 98.5°F | Ht 71.0 in | Wt 223.0 lb

## 2012-11-15 DIAGNOSIS — L739 Follicular disorder, unspecified: Secondary | ICD-10-CM

## 2012-11-15 DIAGNOSIS — L738 Other specified follicular disorders: Secondary | ICD-10-CM

## 2012-11-15 MED ORDER — MUPIROCIN 2 % EX OINT: TOPICAL_OINTMENT | Freq: Three times a day (TID) | CUTANEOUS | Status: DC

## 2012-11-15 MED ORDER — CHLORHEXIDINE GLUCONATE 4 % EX LIQD: 60.0000 mL | Freq: Every day | CUTANEOUS | Status: DC

## 2012-11-15 NOTE — Progress Notes (Signed)
[facility] NOTE  RFV: referral for folliculitis Subjective:    Patient ID: Alec Johnson, male    DOB: 1950-06-18, 62 y.o.   MRN: [id number]  HPI 62yo M who started having rash/folliculitis, started in February 2014. Started seeing MDs in May who had tried doxycycline for 30 days wihtout improvement, and then had wound culture that showed enterobacter. Given 12 day course of cipro in July, and 24 day course of cipro in end of August and then 14 day course of bactrim in September. Again had another bactrim 12 day course. Everytime he has quick improvement after 3-4 days of antibiotics. He states that the rash is significantly pruritis, and has had exocoriation with bleeding.  Felt like bad acne all over body, torso, face and extremities, and scalp.  He is being managed by his dermatologist.   Getting meniscal repair by [facility] orthopedics on wednesay  Allergies  Allergen Reactions  . Penicillins      Current Outpatient Prescriptions on File Prior to Visit  Medication Sig Dispense Refill  . aspirin 81 MG tablet Take 81 mg by mouth daily.      . Cholecalciferol (VITAMIN D) 2000 UNITS CAPS Take 1 capsule by mouth daily.      . fluticasone (FLONASE) 50 MCG/ACT nasal spray Place 2 sprays into the nose daily.  48 g  3  . simvastatin (ZOCOR) 80 MG tablet Take 1/2 - 1 tab po QHS for cholesterol  90 tablet  1  . tamsulosin (FLOMAX) 0.4 MG CAPS capsule Start with 1 tab 1/2 hour after supper and increase to 2 if needed  60 capsule  1  . VERAMYST 27.5 MCG/SPRAY nasal spray prn       No current facility-administered medications on file prior to visit.   Active Ambulatory Problems    Diagnosis Date Noted  . Diverticular disease   . Neck pain   . Hyperlipidemia   . Allergic rhinitis   . Allergic sinusitis   . GERD (gastroesophageal reflux disease)   . Seborrheic keratosis   . Osteoarthritis   . Erectile dysfunction   . Colon polyp   . Bradycardia 10/01/2011  . Tobacco abuse 10/01/2011    Resolved Ambulatory Problems    Diagnosis Date Noted  . No Resolved Ambulatory Problems   No Additional Past Medical History   History  Substance Use Topics  . Smoking status: Former Smoker    Quit date: 01/21/2011  . Smokeless tobacco: Never Used  . Alcohol Use: Yes     Comment: Occasional  - retired from [employer] at [employer]. Plays golf, or fishing  family history includes Coronary artery disease in his brother.  Review of Systems   Constitutional: Negative for fever, chills, diaphoresis, activity change, appetite change, fatigue and unexpected weight change.  HENT: Negative for congestion, sore throat, rhinorrhea, sneezing, trouble swallowing and sinus pressure.  Eyes: Negative for photophobia and visual disturbance.  Respiratory: Negative for cough, chest tightness, shortness of breath, wheezing and stridor.  Cardiovascular: Negative for chest pain, palpitations and leg swelling.  Gastrointestinal: Negative for nausea, vomiting, abdominal pain, diarrhea, constipation, blood in stool, abdominal distention and anal bleeding.  Genitourinary: Negative for dysuria, hematuria, flank pain and difficulty urinating.  Musculoskeletal: Negative for myalgias, back pain, joint swelling, arthralgias and gait problem.  Skin: Negative for color change, pallor, rash and wound.  Neurological: Negative for dizziness, tremors, weakness and light-headedness.  Hematological: Negative for adenopathy. Does not bruise/bleed easily.  Psychiatric/Behavioral: Negative for behavioral problems,  confusion, sleep disturbance, dysphoric mood, decreased concentration and agitation.       Objective:   Physical Exam BP 130/74  Pulse 51  Temp(Src) 98.5 F (36.9 C) (Oral)  Ht 5\' 11"  (1.803 m)  Wt 223 lb (101.152 kg)  BMI 31.12 kg/m2 Physical Exam  Constitutional: He is oriented to person, place, and time. He appears well-developed and well-nourished. No distress.  HENT:  Mouth/Throat: Oropharynx is  clear and moist. No oropharyngeal exudate.  Cardiovascular: Normal rate, regular rhythm and normal heart sounds. Exam reveals no gallop and no friction rub.  No murmur heard.  Pulmonary/Chest: Effort normal and breath sounds normal. No respiratory distress. He has no wheezes.  Abdominal: Soft. Bowel sounds are normal. He exhibits no distension. There is no tenderness.  Lymphadenopathy:  He has no cervical adenopathy.  Neurological: He is alert and oriented to person, place, and time.  Skin: Skin is warm and dry. No rash noted. No erythema. Has some scars to previously affected areas. No active lesions presently Psychiatric: He has a normal mood and affect. His behavior is normal.          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 Maybe need 2nd derm opinion. No need for further antibiotics since he has had numerous courses over these last few months.  Upcoming orthopedics surgery for meniscal tear. Patient previously treated for MRSA skin infection. 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 We will do decolonization protocol with - will give RX for mupirocin BID x 5 days  - wil give RX for hibiclins bath 5 days

## 2012-11-17 ENCOUNTER — Ambulatory Visit (INDEPENDENT_AMBULATORY_CARE_PROVIDER_SITE_OTHER): Payer: Managed Care, Other (non HMO) | Admitting: Family Medicine

## 2012-11-17 ENCOUNTER — Encounter: Payer: Self-pay | Admitting: Family Medicine

## 2012-11-17 VITALS — BP 115/72 | HR 43 | Resp 16 | Ht 69.25 in | Wt 222.0 lb

## 2012-11-17 DIAGNOSIS — I498 Other specified cardiac arrhythmias: Secondary | ICD-10-CM

## 2012-11-17 DIAGNOSIS — Z0000 Encounter for general adult medical examination without abnormal findings: Secondary | ICD-10-CM

## 2012-11-17 DIAGNOSIS — R001 Bradycardia, unspecified: Secondary | ICD-10-CM

## 2012-11-17 NOTE — Progress Notes (Signed)
  Subjective:    Patient ID: Alec Johnson, male    DOB: 1950-10-20, 62 y.o.   MRN: [id number]  HPI  [name] is here today for his annual CPE.  He has done well since his last office visit.  He is undergoing right knee surgery today.    Review of Systems  Constitutional: Negative.   HENT: Negative.   Eyes: Negative.   Respiratory: Negative.   Cardiovascular: Negative.   Gastrointestinal: Negative.   Endocrine: Negative.   Genitourinary: Negative.   Musculoskeletal: Negative.   Skin: Negative.   Allergic/Immunologic: Negative.   Neurological: Negative.   Hematological: Negative.   Psychiatric/Behavioral: Negative.      Allergies  Allergen Reactions  . Penicillins      Past Medical History  Diagnosis Date  . Diverticular disease   . Hyperlipidemia   . Allergic rhinitis   . Allergic sinusitis   . GERD (gastroesophageal reflux disease)   . Seborrheic keratosis   . Osteoarthritis   . Erectile dysfunction   . Colon polyp      Family History  Problem Relation Age of Onset  . 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     Education:  [employer]    Tobacco Use/Exposure: He has quit smoking.        Alcohol Use: Occasional    Drug Use:  None   Diet:  Regular   Exercise:  He was walking daily until he hurt his hip.     Hobbies:  Fishing, Gardening, Golf       Objective:   Physical Exam  Vitals reviewed. Constitutional: He is oriented to person, place, and time. He appears well-developed and well-nourished. No distress.  HENT:  Head: Normocephalic.  Eyes: No scleral icterus.  Neck: Neck supple. No thyromegaly present.  Cardiovascular: Normal rate, regular rhythm and normal heart sounds.   Pulmonary/Chest:  Effort normal and breath sounds normal.  Musculoskeletal: Normal range of motion. He exhibits no edema.  Neurological: He is alert and oriented to person, place, and time. He has normal reflexes.  Skin: Skin is warm and dry. No rash noted.  Psychiatric: He has a normal mood and affect. His behavior is normal. Judgment and thought content normal.      Assessment & Plan:

## 2012-11-28 ENCOUNTER — Encounter: Payer: Self-pay | Admitting: Family Medicine

## 2012-11-28 DIAGNOSIS — N400 Enlarged prostate without lower urinary tract symptoms: Secondary | ICD-10-CM | POA: Insufficient documentation

## 2012-11-28 DIAGNOSIS — Z23 Encounter for immunization: Secondary | ICD-10-CM | POA: Insufficient documentation

## 2012-11-28 NOTE — Assessment & Plan Note (Signed)
The patient confirmed that they are not allergic to eggs and have never had a bad reaction with the flu shot in the past.  The vaccination was given without difficulty.   

## 2012-11-28 NOTE — Assessment & Plan Note (Signed)
Alec Johnson continues to be smoke free!!

## 2012-11-28 NOTE — Assessment & Plan Note (Signed)
Refilled his simvastatin.

## 2012-11-28 NOTE — Assessment & Plan Note (Signed)
He was given a prescription for Flomax that he'll try until he gets in to see Dr. Patsi Sears.

## 2012-11-28 NOTE — Assessment & Plan Note (Signed)
He was given a prescription for Flonase.

## 2012-12-07 ENCOUNTER — Ambulatory Visit: Payer: Managed Care, Other (non HMO) | Admitting: Internal Medicine

## 2012-12-07 ENCOUNTER — Encounter: Payer: Self-pay | Admitting: Family Medicine

## 2013-01-07 ENCOUNTER — Encounter: Payer: Self-pay | Admitting: *Deleted

## 2013-01-22 DIAGNOSIS — Z0000 Encounter for general adult medical examination without abnormal findings: Secondary | ICD-10-CM | POA: Insufficient documentation

## 2013-02-18 ENCOUNTER — Encounter: Payer: Self-pay | Admitting: Family Medicine

## 2013-02-18 ENCOUNTER — Ambulatory Visit (INDEPENDENT_AMBULATORY_CARE_PROVIDER_SITE_OTHER): Payer: Managed Care, Other (non HMO) | Admitting: Family Medicine

## 2013-02-18 VITALS — BP 120/69 | HR 51 | Resp 16 | Wt 230.0 lb

## 2013-02-18 DIAGNOSIS — L03019 Cellulitis of unspecified finger: Secondary | ICD-10-CM

## 2013-02-18 DIAGNOSIS — R21 Rash and other nonspecific skin eruption: Secondary | ICD-10-CM

## 2013-02-18 DIAGNOSIS — L02519 Cutaneous abscess of unspecified hand: Secondary | ICD-10-CM

## 2013-02-18 DIAGNOSIS — L03012 Cellulitis of left finger: Secondary | ICD-10-CM

## 2013-02-18 MED ORDER — METHYLPREDNISOLONE SODIUM SUCC 125 MG IJ SOLR
125.0000 mg | Freq: Once | INTRAMUSCULAR | Status: AC
  Administered 2013-02-18: 125 mg via INTRAMUSCULAR

## 2013-02-18 MED ORDER — DOXYCYCLINE HYCLATE 100 MG PO CAPS: 100.0000 mg | ORAL_CAPSULE | Freq: Two times a day (BID) | ORAL | Status: AC

## 2013-02-18 MED ORDER — RIFAMPIN 300 MG PO CAPS: 600.0000 mg | ORAL_CAPSULE | Freq: Every day | ORAL | Status: AC

## 2013-02-18 NOTE — Patient Instructions (Signed)
1)  Cellulitis - Take the Rifampin 600 mg (2 pills) in the morning for 5 days and the Doxycycline 100 mg capsules twice a day for 10 days.  Soak the finger 1-2 x per day and apply the Bactroban ointment twice a day.    2)  Testosterone - Get Dr. Arlyn Leak opinion about the testosterone cream.      Cellulitis Cellulitis is an infection of the skin and the tissue beneath it. The infected area is usually red and tender. Cellulitis occurs most often in the arms and lower legs.  CAUSES  Cellulitis is caused by bacteria that enter the skin through cracks or cuts in the skin. The most common types of bacteria that cause cellulitis are Staphylococcus and Streptococcus. SYMPTOMS   Redness and warmth.  Swelling.  Tenderness or pain.  Fever. DIAGNOSIS  Your caregiver can usually determine what is wrong based on a physical exam. Blood tests may also be done. TREATMENT  Treatment usually involves taking an antibiotic medicine. HOME CARE INSTRUCTIONS   Take your antibiotics as directed. Finish them even if you start to feel better.  Keep the infected arm or leg elevated to reduce swelling.  Apply a warm cloth to the affected area up to 4 times per day to relieve pain.  Only take over-the-counter or prescription medicines for pain, discomfort, or fever as directed by your caregiver.  Keep all follow-up appointments as directed by your caregiver. SEEK MEDICAL CARE IF:   You notice red streaks coming from the infected area.  Your red area gets larger or turns dark in color.  Your bone or joint underneath the infected area becomes painful after the skin has healed.  Your infection returns in the same area or another area.  You notice a swollen bump in the infected area.  You develop new symptoms. SEEK IMMEDIATE MEDICAL CARE IF:   You have a fever.  You feel very sleepy.  You develop vomiting or diarrhea.  You have a general ill feeling (malaise) with muscle aches and  pains. MAKE SURE YOU:   Understand these instructions.  Will watch your condition.  Will get help right away if you are not doing well or get worse. Document Released: 10/16/2004 Document Revised: 07/08/2011 Document Reviewed: 03/24/2011 [facility] Patient Information 2014 [place].

## 2013-02-18 NOTE — Progress Notes (Signed)
Subjective:    Patient ID: Alec Johnson, male    DOB: [birth year], 63 y.o.   MRN: [id number]  HPI  AM Visit:  Alec Johnson is here today to have his left index finger evaluated.  He says that he first noted that his finger was itching on 02/16/12.  Since then his finger has become red, swollen and painful.  He does not recall getting bitten by any insect.  He has been soaking his finger in salt water which has not seem to help him.    PM Visit:  He was seen this morning and was put on antibiotics which he took around 10:30.  He developed a rash and returned for a recheck at 4 pm.     Review of Systems  Constitutional: Negative for fatigue and unexpected weight change.  HENT: Negative.   Respiratory: Negative for shortness of breath.   Cardiovascular: Negative for chest pain, palpitations and leg swelling.  Gastrointestinal: Negative.   Genitourinary: Negative.   Musculoskeletal: Negative for myalgias.  Skin: Positive for rash.       10 am:  Redness, pain and swelling in his left index finger.       4 pm:  Rash on arms and back.    Neurological: Negative.   Psychiatric/Behavioral: Negative.      Past Medical History  Diagnosis Date  . Diverticular disease   . Hyperlipidemia   . Allergic rhinitis   . Allergic sinusitis   . GERD (gastroesophageal reflux disease)   . Seborrheic keratosis   . Osteoarthritis   . Erectile dysfunction   . Colon polyp      Past Surgical History  Procedure Laterality Date  . Leg surgery    . Athroscopic knee surgery    . 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.     Education:  [employer]    Tobacco Use/Exposure: He has quit smoking.        Alcohol Use: Occasional    Drug Use:  None   Diet:  Regular   Exercise:  He  was walking daily until he hurt his hip.     Hobbies:  Fishing, Gardening, Golf      Family History  Problem Relation Age of Onset  . Coronary artery disease Brother     MI  . Heart attack Brother   . Stroke Mother   . Cancer Father     Lung Cancer  . Cancer Paternal Grandfather     Lung Cancer  . Heart attack Paternal Grandfather      Current Outpatient Prescriptions on File Prior to Visit  Medication Sig Dispense Refill  . aspirin 81 MG tablet Take 81 mg by mouth daily.      . chlorhexidine (HIBICLENS) 4 % external liquid Apply 60 mLs (4 application total) topically daily. X 10 days  473 mL  0  . Cholecalciferol (VITAMIN D) 2000 UNITS CAPS Take 1 capsule by mouth daily.      . fluticasone (FLONASE) 50 MCG/ACT nasal spray Place 2 sprays into the nose daily.  48 g  3  . simvastatin (ZOCOR) 80 MG tablet Take 1/2 - 1 tab po QHS for cholesterol  90 tablet  1   No current facility-administered medications  on file prior to visit.     Allergies  Allergen Reactions  . Penicillins      Immunization History  Administered Date(s) Administered  . Influenza,inj,Quad PF,36+ Mos 09/29/2012  . Pneumococcal Conjugate-13 12/04/2008  . Tdap 11/07/2008  . Zoster 09/14/2011       Objective:   Physical Exam  Nursing note and vitals reviewed. Constitutional: He is oriented to person, place, and time. He appears well-developed.  HENT:  Head: Normocephalic.  Eyes: No scleral icterus.  Neck: Neck supple. No thyromegaly present.  Cardiovascular: Normal rate.   Musculoskeletal:       Arms: Neurological: He is alert and oriented to person, place, and time. He has normal reflexes.  Skin: Skin is warm and dry. Rash noted.     Redness pain and swelling in his left index finger.  Psychiatric: He has a normal mood and affect. His behavior is normal. Judgment and thought content normal.      Assessment & Plan:    Alec Johnson was seen today for finger problem.  He received prescriptions  for 2 antibiotics and then broke out in a rash.  He returned to the office and received an injection of Solu-Medrol.  He immediately improved once he got the steroid shot.  I had him hold the Rifampin thinking this may have been the culprit but the rash returned the following day.  He was called in a 6 day course of Prednisone and I switched him to Zithromax.    Diagnoses and associated orders for this visit:  Cellulitis of finger of left hand - doxycycline (VIBRAMYCIN) 100 MG capsule; Take 1 capsule (100 mg total) by mouth 2 (two) times daily. - rifampin (RIFADIN) 300 MG capsule; Take 2 capsules (600 mg total) by mouth daily.  Rash and nonspecific skin eruption - methylPREDNISolone sodium succinate (SOLU-MEDROL) 125 mg/2 mL injection 125 mg; Inject 2 mLs (125 mg total) into the muscle once.   TIME SPENT "FACE TO FACE" WITH PATIENT - 30 MINS

## 2013-04-18 DIAGNOSIS — IMO0002 Reserved for concepts with insufficient information to code with codable children: Secondary | ICD-10-CM | POA: Diagnosis not present

## 2013-04-18 DIAGNOSIS — Z4889 Encounter for other specified surgical aftercare: Secondary | ICD-10-CM | POA: Diagnosis not present

## 2013-04-30 DIAGNOSIS — R21 Rash and other nonspecific skin eruption: Secondary | ICD-10-CM | POA: Insufficient documentation

## 2013-04-30 DIAGNOSIS — L03012 Cellulitis of left finger: Secondary | ICD-10-CM | POA: Insufficient documentation

## 2013-05-23 DIAGNOSIS — N139 Obstructive and reflux uropathy, unspecified: Secondary | ICD-10-CM | POA: Diagnosis not present

## 2013-05-23 DIAGNOSIS — N401 Enlarged prostate with lower urinary tract symptoms: Secondary | ICD-10-CM | POA: Diagnosis not present

## 2013-06-07 ENCOUNTER — Encounter: Payer: Self-pay | Admitting: Family Medicine

## 2013-06-07 ENCOUNTER — Ambulatory Visit (INDEPENDENT_AMBULATORY_CARE_PROVIDER_SITE_OTHER): Payer: Medicare Other | Admitting: Family Medicine

## 2013-06-07 VITALS — BP 141/71 | HR 53 | Resp 16 | Wt 226.0 lb

## 2013-06-07 DIAGNOSIS — L0291 Cutaneous abscess, unspecified: Secondary | ICD-10-CM

## 2013-06-07 DIAGNOSIS — L0390 Cellulitis, unspecified: Secondary | ICD-10-CM

## 2013-06-07 DIAGNOSIS — R21 Rash and other nonspecific skin eruption: Secondary | ICD-10-CM

## 2013-06-07 MED ORDER — PREDNISONE (PAK) 10 MG PO TABS: ORAL_TABLET | Freq: Every day | ORAL | Status: DC

## 2013-06-07 MED ORDER — MUPIROCIN 2 % EX OINT: TOPICAL_OINTMENT | CUTANEOUS | Status: DC

## 2013-06-07 MED ORDER — METHYLPREDNISOLONE SODIUM SUCC 125 MG IJ SOLR
125.0000 mg | Freq: Once | INTRAMUSCULAR | Status: AC
  Administered 2013-06-07: 125 mg via INTRAMUSCULAR

## 2013-06-07 MED ORDER — TRIAMCINOLONE ACETONIDE 0.1 % EX CREA: 1.0000 "application " | TOPICAL_CREAM | Freq: Two times a day (BID) | CUTANEOUS | Status: DC

## 2013-06-07 MED ORDER — HYDROXYZINE HCL 25 MG PO TABS: 25.0000 mg | ORAL_TABLET | Freq: Four times a day (QID) | ORAL | Status: DC | PRN

## 2013-06-07 NOTE — Progress Notes (Signed)
Subjective:    Patient ID: Alec Johnson, male    DOB: [birth year], 63 y.o.   MRN: [id number]  HPI  Alec Johnson is here today complaining of a red itchy rash on his legs, arms and stomach. He took doxycycline today and several hours later he broke out in this itchy rash. He had a reaction like this several months ago when he took this and rifampin together. He thought the rifampin caused the rash. He would like to get a steroid shot to help with the itching.    Review of Systems  Constitutional: Negative for activity change, appetite change and fatigue.  Skin: Positive for rash.       Red itchy rash on legs, stomach and arms  All other systems reviewed and are negative.    Past Medical History  Diagnosis Date  . Diverticular disease   . Hyperlipidemia   . Allergic rhinitis   . Allergic sinusitis   . GERD (gastroesophageal reflux disease)   . Seborrheic keratosis   . Osteoarthritis   . Erectile dysfunction   . Colon polyp      Past Surgical History  Procedure Laterality Date  . Leg surgery    . Athroscopic knee surgery    . 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.     Education:  [employer]    Tobacco Use/Exposure: He has quit smoking.        Alcohol Use: Occasional    Drug Use:  None   Diet:  Regular   Exercise:  He was walking daily until he hurt his hip.     Hobbies:  Fishing, Gardening, Golf      Family History  Problem Relation Age of Onset  . Coronary artery disease Brother     MI  . Heart attack Brother   . Stroke Mother   . Cancer Father     Lung Cancer  . Cancer Paternal Grandfather     Lung Cancer  . Heart attack Paternal Grandfather      Current Outpatient Prescriptions on File Prior to Visit  Medication Sig Dispense  Refill  . aspirin 81 MG tablet Take 81 mg by mouth daily.      . Cholecalciferol (VITAMIN D) 2000 UNITS CAPS Take 1 capsule by mouth daily.      . fluticasone (FLONASE) 50 MCG/ACT nasal spray Place 2 sprays into the nose daily.  48 g  3  . simvastatin (ZOCOR) 80 MG tablet Take 1/2 - 1 tab po QHS for cholesterol  90 tablet  1   No current facility-administered medications on file prior to visit.     Allergies  Allergen Reactions  . Penicillins   . Doxycycline Hyclate Rash  . Rifampin Rash     Immunization History  Administered Date(s) Administered  . Influenza,inj,Quad PF,36+ Mos 09/29/2012  . Pneumococcal Conjugate-13 12/04/2008  . Tdap 11/07/2008  . Zoster 09/14/2011       Objective:   Physical Exam  Nursing note and vitals reviewed. Constitutional: He is oriented to person, place, and time. He appears well-nourished.  Pulmonary/Chest: Effort normal.  Musculoskeletal: Normal range of motion.  Neurological: He is alert and oriented to person, place, and time.  Skin: Rash noted.  Red itchy  rash on legs, arms and stomach       Assessment & Plan:   Shneur was seen today for rash.  Diagnoses and associated orders for this visit:  Rash and nonspecific skin eruption - triamcinolone cream (KENALOG) 0.1 %; Apply 1 application topically 2 (two) times daily. - predniSONE (STERAPRED UNI-PAK) 10 MG tablet; Take by mouth daily. Take as directed for 6 days - hydrOXYzine (ATARAX/VISTARIL) 25 MG tablet; Take 1 tablet (25 mg total) by mouth every 6 (six) hours as needed. - methylPREDNISolone sodium succinate (SOLU-MEDROL) 125 mg/2 mL injection 125 mg; Inject 2 mLs (125 mg total) into the muscle once.  Cellulitis and abscess - mupirocin ointment (BACTROBAN) 2 %; Apply to affected area 3 times daily

## 2013-06-07 NOTE — Patient Instructions (Signed)
1)  Rash - Solu-Medrol injection and apply the Triamcinolone cream.  You can take Benadryl vs Hydroxyzine for the itching.  If the rash continues you can take the steroid dose pak.  You may also want to apply Sarna Lotion (Original).    Drug Rash Skin reactions can be caused by several different drugs. Allergy to the medicine can cause itching, hives, and other rashes. Sun exposure causes a red rash with some medicines. Mononucleosis virus can cause a similar red rash when you are taking antibiotics. Sometimes, the rash may be accompanied by pain. The drug rash may happen with new drugs or with medicines that you have been taking for a while. The rash cannot be spread from person to person. In most cases, the symptoms of a drug rash are gone within a few days of stopping the medicine. Your rash, including hives (urticaria), is most likely from the following medicines:  Antibiotics or antimicrobials.  Anticonvulsants or seizure medicines.  Antihypertensives or blood pressure medicines.  Antimalarials.  Antidepressants or depression medicines.  Antianxiety drugs.  Diuretics or water pills.  Nonsteroidal anti-inflammatory drugs.  Simvastatin.  Lithium.  Omeprazole.  Allopurinol.  Pseudoephedrine.  Amiodarone.  Packed red blood cells, when you get a blood transfusion.  Contrast media, such as when getting an imaging test (CT or CAT scan). This drug list is not all inclusive, but drug rashes have been reported with all the medicines listed above.Your caregiver will tell you which medicines to avoid. If you react to a medicine, a similar or worse reaction can occur the next time you take it. If you need to stop taking an antibiotic because of a drug rash, an alternative antibiotic may be needed to get rid of your infection. Antihistamine or cortisone drugs may be prescribed to help relieve your symptoms. Stay out of the sun until the rash is completely gone.  Be sure to let your  caregiver know about your drug reaction. Do not take this medicine in the future. Call your caregiver if your drug rash does not improve within 3 to 4 days. SEEK IMMEDIATE MEDICAL CARE IF:   You develop breathing problems, swelling in the throat, or wheezing.  You have weakness, fainting, fever, and muscle or joint pains.  You develop blisters or peeling of skin, especially around the mouth. Document Released: 02/14/2004 Document Revised: 03/31/2011 Document Reviewed: 11/25/2007 [facility] Patient Information 2014 [place].

## 2013-06-23 ENCOUNTER — Other Ambulatory Visit: Payer: Self-pay | Admitting: Family Medicine

## 2013-07-06 DIAGNOSIS — N139 Obstructive and reflux uropathy, unspecified: Secondary | ICD-10-CM | POA: Diagnosis not present

## 2013-07-06 DIAGNOSIS — N401 Enlarged prostate with lower urinary tract symptoms: Secondary | ICD-10-CM | POA: Diagnosis not present

## 2013-07-26 DIAGNOSIS — M1710 Unilateral primary osteoarthritis, unspecified knee: Secondary | ICD-10-CM | POA: Diagnosis not present

## 2013-09-01 ENCOUNTER — Encounter: Payer: Self-pay | Admitting: Family Medicine

## 2013-09-10 DIAGNOSIS — M1710 Unilateral primary osteoarthritis, unspecified knee: Secondary | ICD-10-CM | POA: Diagnosis not present

## 2013-09-20 DIAGNOSIS — N401 Enlarged prostate with lower urinary tract symptoms: Secondary | ICD-10-CM | POA: Diagnosis not present

## 2013-09-21 ENCOUNTER — Other Ambulatory Visit: Payer: Self-pay | Admitting: Family Medicine

## 2013-09-21 DIAGNOSIS — E785 Hyperlipidemia, unspecified: Secondary | ICD-10-CM

## 2013-09-21 DIAGNOSIS — J309 Allergic rhinitis, unspecified: Secondary | ICD-10-CM

## 2013-09-21 MED ORDER — FLUTICASONE PROPIONATE 50 MCG/ACT NA SUSP: 2.0000 | Freq: Every day | NASAL | Status: AC

## 2013-09-21 MED ORDER — SIMVASTATIN 80 MG PO TABS: ORAL_TABLET | ORAL | Status: DC

## 2013-10-17 DIAGNOSIS — M1710 Unilateral primary osteoarthritis, unspecified knee: Secondary | ICD-10-CM | POA: Diagnosis not present

## 2013-10-24 DIAGNOSIS — M1711 Unilateral primary osteoarthritis, right knee: Secondary | ICD-10-CM | POA: Diagnosis not present

## 2013-10-31 DIAGNOSIS — M1711 Unilateral primary osteoarthritis, right knee: Secondary | ICD-10-CM | POA: Diagnosis not present

## 2013-12-12 DIAGNOSIS — M1711 Unilateral primary osteoarthritis, right knee: Secondary | ICD-10-CM | POA: Diagnosis not present

## 2013-12-12 DIAGNOSIS — Z4789 Encounter for other orthopedic aftercare: Secondary | ICD-10-CM | POA: Diagnosis not present

## 2013-12-21 ENCOUNTER — Other Ambulatory Visit: Payer: Self-pay | Admitting: Surgical

## 2013-12-22 DIAGNOSIS — M1711 Unilateral primary osteoarthritis, right knee: Secondary | ICD-10-CM | POA: Diagnosis not present

## 2013-12-22 DIAGNOSIS — R5383 Other fatigue: Secondary | ICD-10-CM | POA: Diagnosis not present

## 2013-12-22 DIAGNOSIS — E785 Hyperlipidemia, unspecified: Secondary | ICD-10-CM | POA: Diagnosis not present

## 2013-12-22 DIAGNOSIS — Z23 Encounter for immunization: Secondary | ICD-10-CM | POA: Diagnosis not present

## 2013-12-22 DIAGNOSIS — Z125 Encounter for screening for malignant neoplasm of prostate: Secondary | ICD-10-CM | POA: Diagnosis not present

## 2013-12-22 DIAGNOSIS — E291 Testicular hypofunction: Secondary | ICD-10-CM | POA: Diagnosis not present

## 2013-12-28 DIAGNOSIS — E785 Hyperlipidemia, unspecified: Secondary | ICD-10-CM | POA: Diagnosis not present

## 2013-12-28 DIAGNOSIS — E559 Vitamin D deficiency, unspecified: Secondary | ICD-10-CM | POA: Diagnosis not present

## 2013-12-28 DIAGNOSIS — M2550 Pain in unspecified joint: Secondary | ICD-10-CM | POA: Diagnosis not present

## 2013-12-28 DIAGNOSIS — Z125 Encounter for screening for malignant neoplasm of prostate: Secondary | ICD-10-CM | POA: Diagnosis not present

## 2013-12-28 DIAGNOSIS — E291 Testicular hypofunction: Secondary | ICD-10-CM | POA: Diagnosis not present

## 2013-12-28 DIAGNOSIS — R5383 Other fatigue: Secondary | ICD-10-CM | POA: Diagnosis not present

## 2014-01-06 DIAGNOSIS — Z23 Encounter for immunization: Secondary | ICD-10-CM | POA: Diagnosis not present

## 2014-01-06 DIAGNOSIS — Z0000 Encounter for general adult medical examination without abnormal findings: Secondary | ICD-10-CM | POA: Diagnosis not present

## 2014-01-06 DIAGNOSIS — E291 Testicular hypofunction: Secondary | ICD-10-CM | POA: Diagnosis not present

## 2014-01-06 DIAGNOSIS — L821 Other seborrheic keratosis: Secondary | ICD-10-CM | POA: Diagnosis not present

## 2014-01-09 ENCOUNTER — Other Ambulatory Visit (HOSPITAL_COMMUNITY): Payer: Self-pay | Admitting: *Deleted

## 2014-01-09 NOTE — Patient Instructions (Addendum)
Alec Johnson  01/09/2014   Your procedure is scheduled on: 01/18/14   Report to [facility]  Entrance and follow signs to               Alec Johnson at 10:30 am AM.   Call this number if you have problems the morning of surgery [phone]   Remember:  Do not eat food or drink liquids :After Midnight.     Take these medicines the morning of surgery with A SIP OF WATER: none                              You may not have any metal on your body including hair pins and              piercings  Do not wear jewelry, make-up, lotions, powders or perfumes.             Do not wear nail polish.  Do not shave  48 hours prior to surgery.              Men may shave face and neck.   Do not bring valuables to the hospital. Alec Johnson.  Contacts, dentures or bridgework may not be worn into surgery.  Leave suitcase in the car. After surgery it may be brought to your room.     Patients discharged the day of surgery will not be allowed to drive home.  Name and phone number of your driver:  Special Instructions: N/A              Please read over the following fact sheets you were given: _____________________________________________________________________                                                     Alec Johnson  Before surgery, you can play an important role.  Because skin is not sterile, your skin needs to be as free of germs as possible.  You can reduce the number of germs on your skin by washing with CHG (chlorahexidine gluconate) soap before surgery.  CHG is an antiseptic cleaner which kills germs and bonds with the skin to continue killing germs even after washing. Please DO NOT use if you have an allergy to CHG or antibacterial soaps.  If your skin becomes reddened/irritated stop using the CHG and inform your nurse when you arrive at Short Stay. Do not shave (including legs and  underarms) for at least 48 hours prior to the first CHG shower.  You may shave your face. Please follow these instructions carefully:   1.  Shower with CHG Soap the night before surgery and the  morning of Surgery.   2.  If you choose to wash your hair, wash your hair first as usual with your  normal  Shampoo.   3.  After you shampoo, rinse your hair and body thoroughly to remove the  shampoo.  4.  Use CHG as you would any other liquid soap.  You can apply chg directly  to the skin and wash . Gently wash with scrungie or clean wascloth    5.  Apply the CHG Soap to your body ONLY FROM THE NECK DOWN.   Do not use on open                           Wound or open sores. Avoid contact with eyes, ears mouth and genitals (private parts).                        Genitals (private parts) with your normal soap.              6.  Wash thoroughly, paying special attention to the area where your surgery  will be performed.   7.  Thoroughly rinse your body with warm water from the neck down.   8.  DO NOT shower/wash with your normal soap after using and rinsing off  the CHG Soap .                9.  Pat yourself dry with a clean towel.             10.  Wear clean pajamas.             11.  Place clean sheets on your bed the night of your first shower and do not  sleep with pets.  Day of Surgery : Do not apply any lotions/deodorants the morning of surgery.  Please wear clean clothes to the hospital/surgery center.  FAILURE TO FOLLOW THESE INSTRUCTIONS MAY RESULT IN THE CANCELLATION OF YOUR SURGERY    PATIENT SIGNATURE_________________________________  ______________________________________________________________________     Alec Johnson  An incentive spirometer is a tool that can help keep your lungs clear and active. This tool measures how well you are filling your lungs with each breath. Taking long deep breaths may help reverse or decrease  the chance of developing breathing (pulmonary) problems (especially infection) following:  A long period of time when you are unable to move or be active. BEFORE THE PROCEDURE   If the spirometer includes an indicator to show your best effort, your nurse or respiratory therapist will set it to a desired goal.  If possible, sit up straight or lean slightly forward. Try not to slouch.  Hold the incentive spirometer in an upright position. INSTRUCTIONS FOR USE   Sit on the edge of your bed if possible, or sit up as far as you can in bed or on a chair.  Hold the incentive spirometer in an upright position.  Breathe out normally.  Place the mouthpiece in your mouth and seal your lips tightly around it.  Breathe in slowly and as deeply as possible, raising the piston or the ball toward the top of the column.  Hold your breath for 3-5 seconds or for as long as possible. Allow the piston or ball to fall to the bottom of the column.  Remove the mouthpiece from your mouth and breathe out normally.  Rest for a few seconds and repeat Steps 1 through 7 at least 10 times every 1-2 hours when you are awake. Take your time and take a few normal breaths between deep breaths.  The spirometer may include an indicator to show your best effort. Use the indicator as a goal to work toward during  each repetition.  After each set of 10 deep breaths, practice coughing to be sure your lungs are clear. If you have an incision (the cut made at the time of surgery), support your incision when coughing by placing a pillow or rolled up towels firmly against it. Once you are able to get out of bed, walk around indoors and cough well. You may stop using the incentive spirometer when instructed by your caregiver.  RISKS AND COMPLICATIONS  Take your time so you do not get dizzy or light-headed.  If you are in pain, you may need to take or ask for pain medication before doing incentive spirometry. It is harder to  take a deep breath if you are having pain. AFTER USE  Rest and breathe slowly and easily.  It can be helpful to keep track of a log of your progress. Your caregiver can provide you with a simple table to help with this. If you are using the spirometer at home, follow these instructions: Alec Johnson IF:   You are having difficultly using the spirometer.  You have trouble using the spirometer as often as instructed.  Your pain medication is not giving enough relief while using the spirometer.  You develop fever of 100.5 F (38.1 C) or higher. SEEK IMMEDIATE MEDICAL CARE IF:   You cough up bloody sputum that had not been present before.  You develop fever of 102 F (38.9 C) or greater.  You develop worsening pain at or near the incision site. MAKE SURE YOU:   Understand these instructions.  Will watch your condition.  Will get help right away if you are not doing well or get worse. Document Released: 05/19/2006 Document Revised: 03/31/2011 Document Reviewed: 07/20/2006 Exit[employer] Patient Information 2014 Exit[employer], [place].   ________________________________________________________________________  WHAT IS A BLOOD TRANSFUSION? Blood Transfusion Information  A transfusion is the replacement of blood or some of its parts. Blood is made up of multiple cells which provide different functions.  Red blood cells carry oxygen and are used for blood loss replacement.  White blood cells fight against infection.  Platelets control bleeding.  Plasma helps clot blood.  Other blood products are available for specialized needs, such as hemophilia or other clotting disorders. BEFORE THE TRANSFUSION  Who gives blood for transfusions?   Healthy volunteers who are fully evaluated to make sure their blood is safe. This is blood bank blood. Transfusion therapy is the safest it has ever been in the practice of medicine. Before blood is taken from a donor, a complete history is taken to  make sure that person has no history of diseases nor engages in risky social behavior (examples are intravenous drug use or sexual activity with multiple partners). The donor's travel history is screened to minimize risk of transmitting infections, such as malaria. The donated blood is tested for signs of infectious diseases, such as HIV and hepatitis. The blood is then tested to be sure it is compatible with you in order to minimize the chance of a transfusion reaction. If you or a relative donates blood, this is often done in anticipation of surgery and is not appropriate for emergency situations. It takes many days to process the donated blood. RISKS AND COMPLICATIONS Although transfusion therapy is very safe and saves many lives, the main dangers of transfusion include:   Getting an infectious disease.  Developing a transfusion reaction. This is an allergic reaction to something in the blood you were given. Every precaution is taken to prevent  this. The decision to have a blood transfusion has been considered carefully by your caregiver before blood is given. Blood is not given unless the benefits outweigh the risks. AFTER THE TRANSFUSION  Right after receiving a blood transfusion, you will usually feel much better and more energetic. This is especially true if your red blood cells have gotten low (anemic). The transfusion raises the level of the red blood cells which carry oxygen, and this usually causes an energy increase.  The nurse administering the transfusion will monitor you carefully for complications. HOME CARE INSTRUCTIONS  No special instructions are needed after a transfusion. You may find your energy is better. Speak with your caregiver about any limitations on activity for underlying diseases you may have. SEEK MEDICAL CARE IF:   Your condition is not improving after your transfusion.  You develop redness or irritation at the intravenous (IV) site. SEEK IMMEDIATE MEDICAL CARE  IF:  Any of the following symptoms occur over the next 12 hours:  Shaking chills.  You have a temperature by mouth above 102 F (38.9 C), not controlled by medicine.  Chest, back, or muscle pain.  People around you feel you are not acting correctly or are confused.  Shortness of breath or difficulty breathing.  Dizziness and fainting.  You get a rash or develop hives.  You have a decrease in urine output.  Your urine turns a dark color or changes to pink, red, or brown. Any of the following symptoms occur over the next 10 days:  You have a temperature by mouth above 102 F (38.9 C), not controlled by medicine.  Shortness of breath.  Weakness after normal activity.  The white part of the eye turns yellow (jaundice).  You have a decrease in the amount of urine or are urinating less often.  Your urine turns a dark color or changes to pink, red, or brown. Document Released: 01/04/2000 Document Revised: 03/31/2011 Document Reviewed: 08/23/2007 [facility] Patient Information 2014 [place], [place].  _______________________________________________________________________

## 2014-01-10 ENCOUNTER — Ambulatory Visit (HOSPITAL_COMMUNITY)
Admission: RE | Admit: 2014-01-10 | Discharge: 2014-01-10 | Disposition: A | Discharge status: Home / Self Care | Payer: Medicare Other | Source: Ambulatory Visit | Attending: Surgical | Admitting: Surgical

## 2014-01-10 ENCOUNTER — Encounter (HOSPITAL_COMMUNITY)
Admission: RE | Admit: 2014-01-10 | Discharge: 2014-01-10 | Disposition: A | Discharge status: Home / Self Care | Payer: Medicare Other | Source: Ambulatory Visit | Attending: Orthopedic Surgery | Admitting: Orthopedic Surgery

## 2014-01-10 ENCOUNTER — Encounter (HOSPITAL_COMMUNITY): Payer: Self-pay

## 2014-01-10 DIAGNOSIS — Z7901 Long term (current) use of anticoagulants: Secondary | ICD-10-CM | POA: Insufficient documentation

## 2014-01-10 DIAGNOSIS — Z01818 Encounter for other preprocedural examination: Secondary | ICD-10-CM

## 2014-01-10 DIAGNOSIS — Z01810 Encounter for preprocedural cardiovascular examination: Secondary | ICD-10-CM | POA: Insufficient documentation

## 2014-01-10 DIAGNOSIS — M1711 Unilateral primary osteoarthritis, right knee: Secondary | ICD-10-CM | POA: Insufficient documentation

## 2014-01-10 LAB — URINALYSIS, ROUTINE W REFLEX MICROSCOPIC
Bilirubin Urine: NEGATIVE
Glucose, UA: NEGATIVE mg/dL
Hgb urine dipstick: NEGATIVE
Ketones, ur: NEGATIVE mg/dL
Leukocytes, UA: NEGATIVE
Nitrite: NEGATIVE
Protein, ur: NEGATIVE mg/dL
Specific Gravity, Urine: 1.026 (ref 1.005–1.030)
Urobilinogen, UA: 1 mg/dL (ref 0.0–1.0)
pH: 7 (ref 5.0–8.0)

## 2014-01-10 LAB — SURGICAL PCR SCREEN
MRSA, PCR: NEGATIVE
Staphylococcus aureus: NEGATIVE

## 2014-01-10 LAB — PROTIME-INR
INR: 1.01 (ref 0.00–1.49)
Prothrombin Time: 13.4 seconds (ref 11.6–15.2)

## 2014-01-10 LAB — APTT: aPTT: 28 seconds (ref 24–37)

## 2014-01-10 IMAGING — CR DG CHEST 2V
2 series · 2 of 2 positions shown · non-contrast
Comparison: [DATE]

CLINICAL DATA: Preop right total knee replacement

EXAM:
CHEST  2 VIEW

[w chest pa]
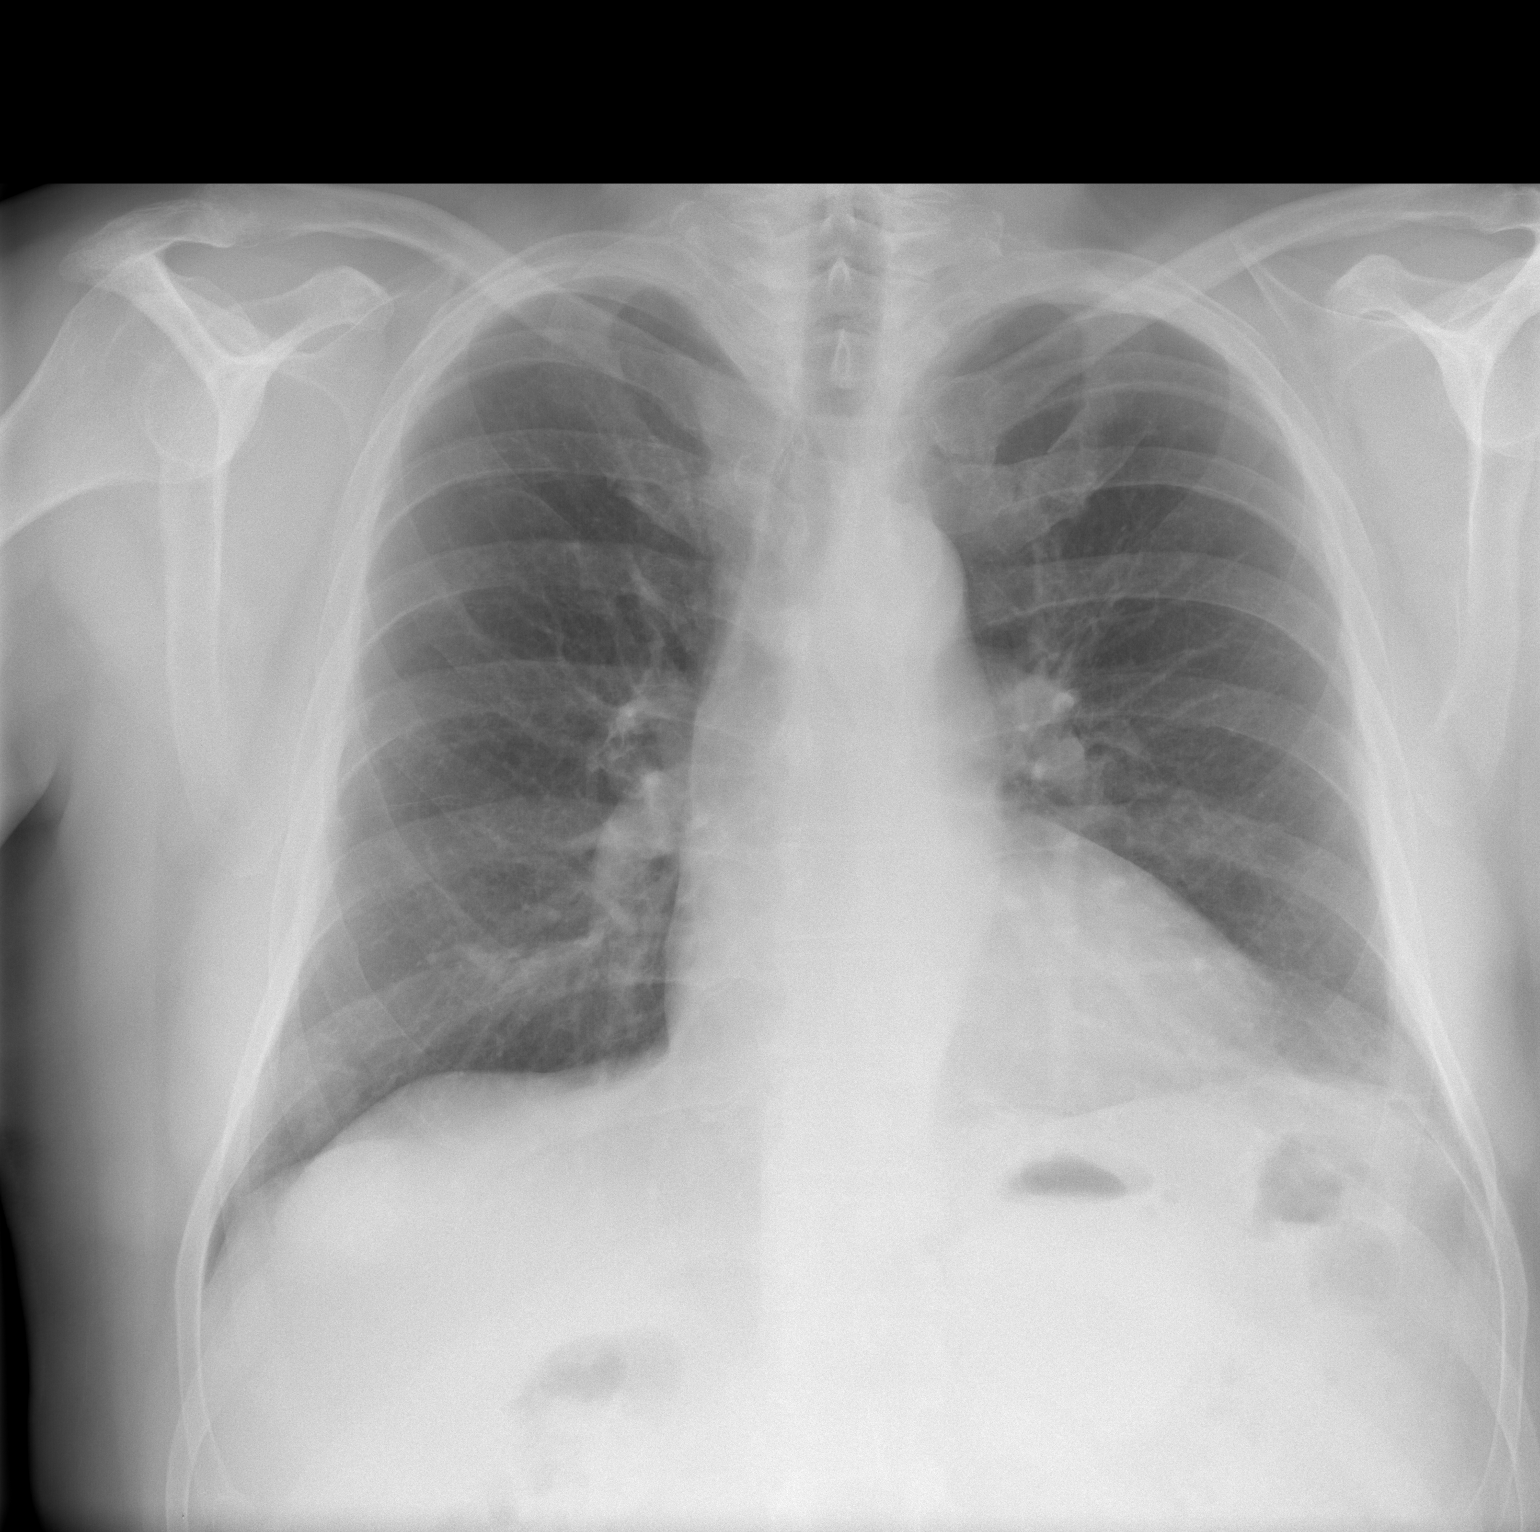

[w chest lat]
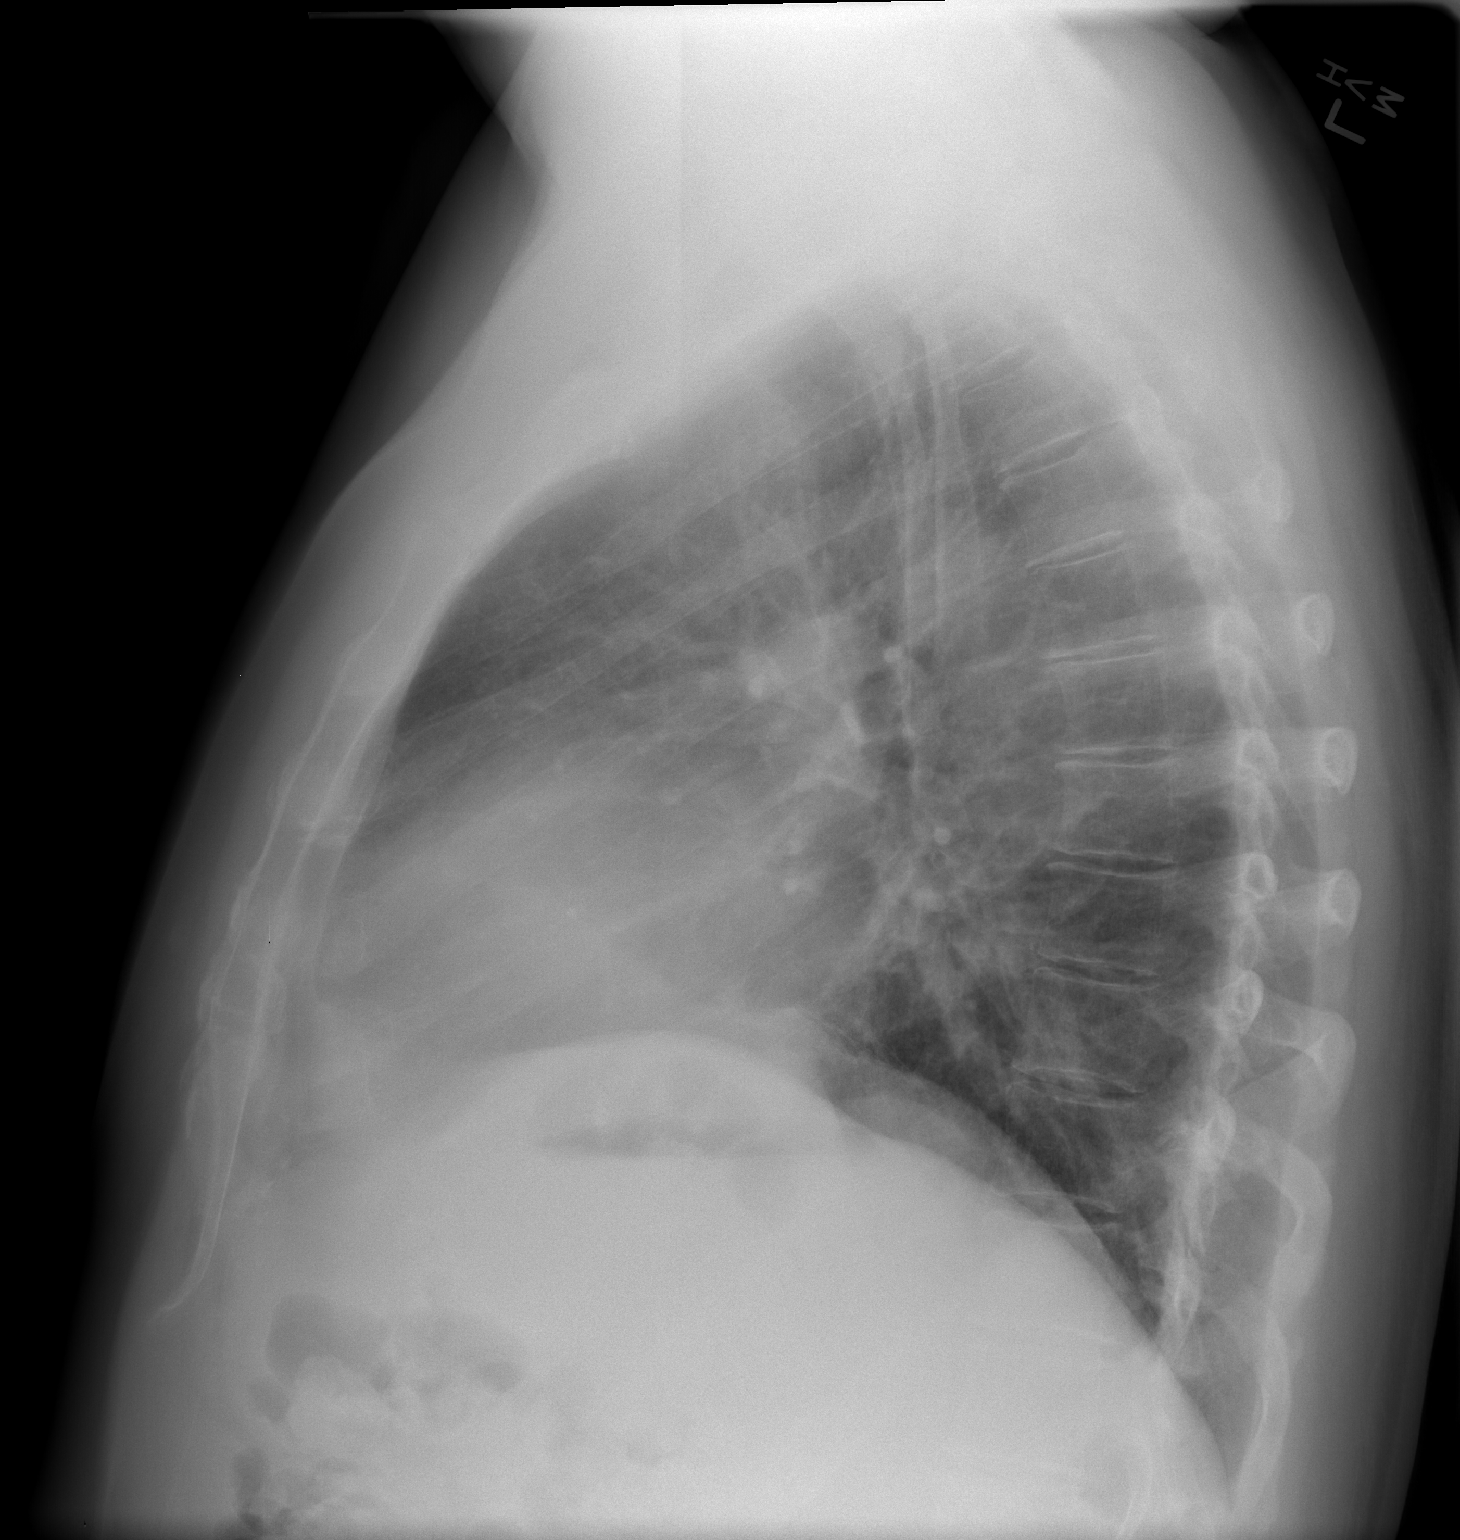

[2 of 2 positions shown; findings below may reference images not displayed]

FINDINGS: The heart size and mediastinal contours are within normal limits.
Both lungs are clear. The visualized skeletal structures are
unremarkable.
IMPRESSION: No active cardiopulmonary disease.

## 2014-01-10 NOTE — Progress Notes (Signed)
   01/10/14 1311  OBSTRUCTIVE SLEEP APNEA  Have you ever been diagnosed with sleep apnea through a sleep study? No  Do you snore loudly (loud enough to be heard through closed doors)?  1  Do you often feel tired, fatigued, or sleepy during the daytime? 0  Has anyone observed you stop breathing during your sleep? 0  Do you have, or are you being treated for high blood pressure? 0  BMI more than 35 kg/m2? 0  Age over 65 years old? 1  Neck circumference greater than 40 cm/16 inches? 1  Gender: 1  Obstructive Sleep Apnea Score 4  Score 4 or greater  Results sent to PCP

## 2014-01-10 NOTE — Progress Notes (Signed)
CBC / CMET / EKG / CLEARANCE DONE AT PCP OFFICE ON 01/06/14 ON CHART

## 2014-01-11 NOTE — H&P (Signed)
TOTAL KNEE ADMISSION H&P  Patient is being admitted for right total knee arthroplasty.  Subjective:  Chief Complaint:right knee pain.  HPI: Alec Johnson, 63 y.o. male, has a history of pain and functional disability in the right knee due to arthritis and has failed non-surgical conservative treatments for greater than 12 weeks to includeNSAID's and/or analgesics, corticosteriod injections, viscosupplementation injections and activity modification.  Onset of symptoms was gradual, starting 3 years ago with gradually worsening course since that time. The patient noted prior procedures on the knee to include  arthroscopy and menisectomy on the right knee(s).  Patient currently rates pain in the right knee(s) at 7 out of 10 with activity. Patient has night pain, worsening of pain with activity and weight bearing, pain that interferes with activities of daily living, pain with passive range of motion, crepitus and joint swelling.  Patient has evidence of periarticular osteophytes and joint space narrowing by imaging studies. There is no active infection.  Patient Active Problem List   Diagnosis Date Noted  . Rash and nonspecific skin eruption 04/30/2013  . Cellulitis of finger of left hand 04/30/2013  . Routine general medical examination at a health care facility 01/22/2013  . BPH (benign prostatic hypertrophy) 11/28/2012  . Need for prophylactic vaccination and inoculation against influenza 11/28/2012  . Bradycardia 10/01/2011  . Tobacco abuse 10/01/2011  . Diverticular disease   . Neck pain   . Hyperlipidemia   . Allergic rhinitis   . Allergic sinusitis   . GERD (gastroesophageal reflux disease)   . Seborrheic keratosis   . Osteoarthritis   . Erectile dysfunction   . Colon polyp    Past Medical History  Diagnosis Date  . Diverticular disease   . Hyperlipidemia   . Seborrheic keratosis   . Osteoarthritis   . Erectile dysfunction   . Colon polyp   . GERD (gastroesophageal reflux  disease)     NO Recent problems    Past Surgical History  Procedure Laterality Date  . Leg surgery  1966    PINNING DUE TO INJURY  . Athroscopic knee surgery      BIL KNEES  . Nose surgery       Current outpatient prescriptions:  acetaminophen (TYLENOL) 500 MG tablet, Take 1,000 mg by mouth every 6 (six) hours as needed for mild pain or moderate pain., Disp: , Rfl: ;   aspirin 81 MG tablet, Take 81 mg by mouth daily., Disp: , Rfl: ;   Cholecalciferol (VITAMIN D3) 5000 UNITS TABS, Take 1 tablet by mouth every Monday, Wednesday, and Friday., Disp: , Rfl: ;   finasteride (PROSCAR) 5 MG tablet, Take 5 mg by mouth every morning., Disp: , Rfl:  fluticasone (FLONASE) 50 MCG/ACT nasal spray, Place 2 sprays into both nostrils daily., Disp: 48 g, Rfl: 0;   glucosamine-chondroitin 500-400 MG tablet, Take 1 tablet by mouth 2 (two) times daily., Disp: , Rfl: ;   meloxicam (MOBIC) 15 MG tablet, Take 15 mg by mouth every evening., Disp: , Rfl:  simvastatin (ZOCOR) 80 MG tablet, Take 40 mg by mouth at bedtime. Take 1/2 - 1 tab po QHS for cholesterol), Disp: 90 tablet, Rfl: 0  Allergies  Allergen Reactions  . Doxycycline Hyclate Rash  . Penicillins Itching and Rash  . Rifampin Rash    History  Substance Use Topics  . Smoking status: Former Smoker    Types: Cigarettes    Quit date: 01/11/2012  . Smokeless tobacco: Never Used     Comment:  He has quit smoking.    . Alcohol Use: Yes     Comment: Occasional    Family History  Problem Relation Age of Onset  . Coronary artery disease Brother     MI  . Heart attack Brother   . Stroke Mother   . Cancer Father     Lung Cancer  . Cancer Paternal Grandfather     Lung Cancer  . Heart attack Paternal Grandfather      Review of Systems  Constitutional: Negative.   HENT: Negative.   Eyes: Negative.   Respiratory: Positive for shortness of breath. Negative for cough, hemoptysis, sputum production and wheezing.        SOB with exertion   Cardiovascular: Negative.   Gastrointestinal: Negative.   Genitourinary: Negative.   Musculoskeletal: Positive for back pain and joint pain. Negative for myalgias, falls and neck pain.       Right knee pain  Skin: Negative.   Neurological: Negative.   Endo/Heme/Allergies: Negative.   Psychiatric/Behavioral: Negative.     Objective:  Physical Exam  Constitutional: He is oriented to person, place, and time. He appears well-developed and well-nourished. No distress.  HENT:  Head: Normocephalic and atraumatic.  Right Ear: External ear normal.  Left Ear: External ear normal.  Nose: Nose normal.  Mouth/Throat: Oropharynx is clear and moist.  Eyes: Conjunctivae and EOM are normal.  Neck: Normal range of motion. Neck supple.  Cardiovascular: Normal rate, regular rhythm, normal heart sounds and intact distal pulses.   No murmur heard. Respiratory: Effort normal and breath sounds normal. No respiratory distress. He has no wheezes.  GI: Soft. Bowel sounds are normal. He exhibits no distension. There is no tenderness.  Musculoskeletal:       Right hip: Normal.       Left hip: Normal.       Right knee: He exhibits decreased range of motion, swelling and effusion. He exhibits no erythema. Tenderness found. Medial joint line and lateral joint line tenderness noted.       Left knee: Normal.       Right lower leg: He exhibits no tenderness and no swelling.       Left lower leg: He exhibits no tenderness and no swelling.  Right knee 10-110 degrees  Neurological: He is alert and oriented to person, place, and time. He has normal strength and normal reflexes. No sensory deficit.  Skin: No rash noted. He is not diaphoretic. No erythema.  Psychiatric: He has a normal mood and affect. His behavior is normal.    Vital signs in last 24 hours: Temp:  [98 F (36.7 C)] 98 F (36.7 C) (12/22 1305) Pulse Rate:  [52] 52 (12/22 1305) Resp:  [16] 16 (12/22 1305) BP: (144)/(61) 144/61 mmHg (12/22  1305) SpO2:  [97 %] 97 % (12/22 1305) Weight:  [105.235 kg (232 lb)] 105.235 kg (232 lb) (12/22 1305)  Labs:   Estimated body mass index is 33.13 kg/(m^2) as calculated from the following:   Height as of 11/17/12: 5' 9.25" (1.759 m).   Weight as of 06/07/13: 102.513 kg (226 lb).   Imaging Review Plain radiographs demonstrate severe degenerative joint disease of the right knee(s). The overall alignment ismild varus. The bone quality appears to be good for age and reported activity level.  Assessment/Plan:  End stage arthritis, right knee   The patient history, physical examination, clinical judgment of the provider and imaging studies are consistent with end stage degenerative joint disease of the right  knee(s) and total knee arthroplasty is deemed medically necessary. The treatment options including medical management, injection therapy arthroscopy and arthroplasty were discussed at length. The risks and benefits of total knee arthroplasty were presented and reviewed. The risks due to aseptic loosening, infection, stiffness, patella tracking problems, thromboembolic complications and other imponderables were discussed. The patient acknowledged the explanation, agreed to proceed with the plan and consent was signed. Patient is being admitted for inpatient treatment for surgery, pain control, PT, OT, prophylactic antibiotics, VTE prophylaxis, progressive ambulation and ADL's and discharge planning. The patient is planning to be discharged home with home health services    TXA IV  PCP: Pamelia Hoit, PA-C

## 2014-01-17 MED ORDER — VANCOMYCIN HCL 10 G IV SOLR
1500.0000 mg | INTRAVENOUS | Status: AC
  Administered 2014-01-18: 1500 mg via INTRAVENOUS
  Filled 2014-01-17: qty 1500

## 2014-01-18 ENCOUNTER — Encounter (HOSPITAL_COMMUNITY): Payer: Self-pay | Admitting: *Deleted

## 2014-01-18 ENCOUNTER — Inpatient Hospital Stay (HOSPITAL_COMMUNITY): Payer: Medicare Other | Admitting: Certified Registered Nurse Anesthetist

## 2014-01-18 ENCOUNTER — Encounter (HOSPITAL_COMMUNITY)
Admission: RE | Disposition: A | Discharge status: Home / Self Care | Payer: Self-pay | Source: Ambulatory Visit | Attending: Orthopedic Surgery

## 2014-01-18 ENCOUNTER — Inpatient Hospital Stay (HOSPITAL_COMMUNITY)
Admission: RE | Admit: 2014-01-18 | Discharge: 2014-01-20 | DRG: 470 | Disposition: A | Discharge status: Home / Self Care | Payer: Medicare Other | Source: Ambulatory Visit | Attending: Orthopedic Surgery | Admitting: Orthopedic Surgery

## 2014-01-18 DIAGNOSIS — Z880 Allergy status to penicillin: Secondary | ICD-10-CM

## 2014-01-18 DIAGNOSIS — J309 Allergic rhinitis, unspecified: Secondary | ICD-10-CM | POA: Diagnosis present

## 2014-01-18 DIAGNOSIS — Z881 Allergy status to other antibiotic agents status: Secondary | ICD-10-CM | POA: Diagnosis not present

## 2014-01-18 DIAGNOSIS — Z888 Allergy status to other drugs, medicaments and biological substances status: Secondary | ICD-10-CM

## 2014-01-18 DIAGNOSIS — Z87891 Personal history of nicotine dependence: Secondary | ICD-10-CM | POA: Diagnosis not present

## 2014-01-18 DIAGNOSIS — E785 Hyperlipidemia, unspecified: Secondary | ICD-10-CM | POA: Diagnosis present

## 2014-01-18 DIAGNOSIS — M25561 Pain in right knee: Secondary | ICD-10-CM | POA: Diagnosis not present

## 2014-01-18 DIAGNOSIS — Z86010 Personal history of colonic polyps: Secondary | ICD-10-CM

## 2014-01-18 DIAGNOSIS — N400 Enlarged prostate without lower urinary tract symptoms: Secondary | ICD-10-CM | POA: Diagnosis present

## 2014-01-18 DIAGNOSIS — K219 Gastro-esophageal reflux disease without esophagitis: Secondary | ICD-10-CM | POA: Diagnosis present

## 2014-01-18 DIAGNOSIS — M1711 Unilateral primary osteoarthritis, right knee: Principal | ICD-10-CM | POA: Diagnosis present

## 2014-01-18 DIAGNOSIS — Z96659 Presence of unspecified artificial knee joint: Secondary | ICD-10-CM

## 2014-01-18 DIAGNOSIS — M179 Osteoarthritis of knee, unspecified: Secondary | ICD-10-CM | POA: Diagnosis not present

## 2014-01-18 DIAGNOSIS — Z96651 Presence of right artificial knee joint: Secondary | ICD-10-CM

## 2014-01-18 HISTORY — PX: TOTAL KNEE ARTHROPLASTY: SHX125

## 2014-01-18 LAB — TYPE AND SCREEN
ABO/RH(D): A POS
Antibody Screen: NEGATIVE

## 2014-01-18 LAB — ABO/RH: ABO/RH(D): A POS

## 2014-01-18 SURGERY — ARTHROPLASTY, KNEE, TOTAL
Anesthesia: General | Site: Knee | Laterality: Right

## 2014-01-18 MED ORDER — VANCOMYCIN HCL IN DEXTROSE 1-5 GM/200ML-% IV SOLN
1000.0000 mg | Freq: Two times a day (BID) | INTRAVENOUS | Status: AC
  Administered 2014-01-18: 1000 mg via INTRAVENOUS
  Filled 2014-01-18: qty 200

## 2014-01-18 MED ORDER — MEPERIDINE HCL 50 MG/ML IJ SOLN: 6.2500 mg | INTRAMUSCULAR | Status: DC | PRN

## 2014-01-18 MED ORDER — CELECOXIB 200 MG PO CAPS
200.0000 mg | ORAL_CAPSULE | Freq: Two times a day (BID) | ORAL | Status: DC
  Administered 2014-01-18 – 2014-01-20 (×4): 200 mg via ORAL
  Filled 2014-01-18 (×5): qty 1

## 2014-01-18 MED ORDER — FENTANYL CITRATE 0.05 MG/ML IJ SOLN
INTRAMUSCULAR | Status: DC | PRN
  Administered 2014-01-18 (×5): 50 ug via INTRAVENOUS

## 2014-01-18 MED ORDER — SODIUM CHLORIDE 0.9 % IR SOLN
Status: AC
  Filled 2014-01-18: qty 1

## 2014-01-18 MED ORDER — HYDROMORPHONE HCL 2 MG PO TABS: 2.0000 mg | ORAL_TABLET | ORAL | Status: DC | PRN

## 2014-01-18 MED ORDER — HYDROMORPHONE HCL 2 MG PO TABS
2.0000 mg | ORAL_TABLET | ORAL | Status: DC | PRN
  Administered 2014-01-18 – 2014-01-20 (×10): 2 mg via ORAL
  Filled 2014-01-18 (×10): qty 1

## 2014-01-18 MED ORDER — THROMBIN 5000 UNITS EX SOLR
OROMUCOSAL | Status: DC | PRN
  Administered 2014-01-18: 14:00:00 via TOPICAL

## 2014-01-18 MED ORDER — CHLORHEXIDINE GLUCONATE 4 % EX LIQD: 60.0000 mL | Freq: Once | CUTANEOUS | Status: DC

## 2014-01-18 MED ORDER — RIVAROXABAN 10 MG PO TABS
10.0000 mg | ORAL_TABLET | Freq: Every day | ORAL | Status: DC
  Administered 2014-01-19 – 2014-01-20 (×2): 10 mg via ORAL
  Filled 2014-01-18 (×3): qty 1

## 2014-01-18 MED ORDER — PROMETHAZINE HCL 25 MG/ML IJ SOLN: 6.2500 mg | INTRAMUSCULAR | Status: DC | PRN

## 2014-01-18 MED ORDER — METHOCARBAMOL 500 MG PO TABS: 500.0000 mg | ORAL_TABLET | Freq: Four times a day (QID) | ORAL | Status: DC | PRN

## 2014-01-18 MED ORDER — PROPOFOL 10 MG/ML IV BOLUS
INTRAVENOUS | Status: DC | PRN
  Administered 2014-01-18: 200 mg via INTRAVENOUS

## 2014-01-18 MED ORDER — ROCURONIUM BROMIDE 100 MG/10ML IV SOLN
INTRAVENOUS | Status: AC
  Filled 2014-01-18: qty 1

## 2014-01-18 MED ORDER — HYDROMORPHONE HCL 2 MG/ML IJ SOLN
INTRAMUSCULAR | Status: AC
  Filled 2014-01-18: qty 1

## 2014-01-18 MED ORDER — ONDANSETRON HCL 4 MG/2ML IJ SOLN
INTRAMUSCULAR | Status: AC
  Filled 2014-01-18: qty 2

## 2014-01-18 MED ORDER — DEXAMETHASONE SODIUM PHOSPHATE 10 MG/ML IJ SOLN
INTRAMUSCULAR | Status: DC | PRN
  Administered 2014-01-18: 10 mg via INTRAVENOUS

## 2014-01-18 MED ORDER — LIDOCAINE HCL (CARDIAC) 20 MG/ML IV SOLN
INTRAVENOUS | Status: DC | PRN
  Administered 2014-01-18: 100 mg via INTRAVENOUS

## 2014-01-18 MED ORDER — ONDANSETRON HCL 4 MG/2ML IJ SOLN
INTRAMUSCULAR | Status: DC | PRN
  Administered 2014-01-18: 4 mg via INTRAVENOUS

## 2014-01-18 MED ORDER — THROMBIN 5000 UNITS EX SOLR
CUTANEOUS | Status: AC
  Filled 2014-01-18: qty 5000

## 2014-01-18 MED ORDER — ALUM & MAG HYDROXIDE-SIMETH 200-200-20 MG/5ML PO SUSP
30.0000 mL | ORAL | Status: DC | PRN
  Administered 2014-01-19: 30 mL via ORAL
  Filled 2014-01-18: qty 30

## 2014-01-18 MED ORDER — SODIUM CHLORIDE 0.9 % IJ SOLN
INTRAMUSCULAR | Status: AC
  Filled 2014-01-18: qty 10

## 2014-01-18 MED ORDER — HYDROMORPHONE HCL 1 MG/ML IJ SOLN
INTRAMUSCULAR | Status: DC | PRN
  Administered 2014-01-18: 0.5 mg via INTRAVENOUS
  Administered 2014-01-18: 1 mg via INTRAVENOUS
  Administered 2014-01-18: 0.5 mg via INTRAVENOUS

## 2014-01-18 MED ORDER — SUCCINYLCHOLINE CHLORIDE 20 MG/ML IJ SOLN
INTRAMUSCULAR | Status: DC | PRN
  Administered 2014-01-18: 100 mg via INTRAVENOUS

## 2014-01-18 MED ORDER — BUPIVACAINE LIPOSOME 1.3 % IJ SUSP
20.0000 mL | Freq: Once | INTRAMUSCULAR | Status: AC
  Administered 2014-01-18: 20 mL
  Filled 2014-01-18: qty 20

## 2014-01-18 MED ORDER — ONDANSETRON HCL 4 MG/2ML IJ SOLN: 4.0000 mg | Freq: Four times a day (QID) | INTRAMUSCULAR | Status: DC | PRN

## 2014-01-18 MED ORDER — ACETAMINOPHEN 325 MG PO TABS
650.0000 mg | ORAL_TABLET | Freq: Four times a day (QID) | ORAL | Status: DC | PRN
Start: 2014-01-18 — End: 2014-01-20
  Administered 2014-01-19: 650 mg via ORAL
  Filled 2014-01-18: qty 2

## 2014-01-18 MED ORDER — RIVAROXABAN 10 MG PO TABS: 10.0000 mg | ORAL_TABLET | Freq: Every day | ORAL | Status: DC

## 2014-01-18 MED ORDER — EPHEDRINE SULFATE 50 MG/ML IJ SOLN
INTRAMUSCULAR | Status: AC
  Filled 2014-01-18: qty 1

## 2014-01-18 MED ORDER — FINASTERIDE 5 MG PO TABS
5.0000 mg | ORAL_TABLET | Freq: Every morning | ORAL | Status: DC
  Administered 2014-01-19 – 2014-01-20 (×2): 5 mg via ORAL
  Filled 2014-01-18 (×2): qty 1

## 2014-01-18 MED ORDER — PHENOL 1.4 % MT LIQD: 1.0000 | OROMUCOSAL | Status: DC | PRN

## 2014-01-18 MED ORDER — ROCURONIUM BROMIDE 100 MG/10ML IV SOLN
INTRAVENOUS | Status: DC | PRN
  Administered 2014-01-18: 40 mg via INTRAVENOUS

## 2014-01-18 MED ORDER — LACTATED RINGERS IV SOLN
INTRAVENOUS | Status: DC
  Administered 2014-01-18: 14:00:00 via INTRAVENOUS
  Administered 2014-01-18: 1000 mL via INTRAVENOUS
  Administered 2014-01-18: 14:00:00 via INTRAVENOUS

## 2014-01-18 MED ORDER — FERROUS SULFATE 325 (65 FE) MG PO TABS
325.0000 mg | ORAL_TABLET | Freq: Three times a day (TID) | ORAL | Status: DC
  Administered 2014-01-19 – 2014-01-20 (×3): 325 mg via ORAL
  Filled 2014-01-18 (×7): qty 1

## 2014-01-18 MED ORDER — FLUTICASONE PROPIONATE 50 MCG/ACT NA SUSP
2.0000 | Freq: Every day | NASAL | Status: DC
  Administered 2014-01-18 – 2014-01-20 (×2): 2 via NASAL
  Filled 2014-01-18: qty 16

## 2014-01-18 MED ORDER — MIDAZOLAM HCL 2 MG/2ML IJ SOLN
INTRAMUSCULAR | Status: AC
  Filled 2014-01-18: qty 2

## 2014-01-18 MED ORDER — LIDOCAINE HCL (CARDIAC) 20 MG/ML IV SOLN
INTRAVENOUS | Status: AC
  Filled 2014-01-18: qty 5

## 2014-01-18 MED ORDER — ACETAMINOPHEN 650 MG RE SUPP: 650.0000 mg | Freq: Four times a day (QID) | RECTAL | Status: DC | PRN

## 2014-01-18 MED ORDER — SODIUM CHLORIDE 0.9 % IJ SOLN
INTRAMUSCULAR | Status: AC
  Filled 2014-01-18: qty 20

## 2014-01-18 MED ORDER — DEXTROSE 5 % IV SOLN
500.0000 mg | Freq: Four times a day (QID) | INTRAVENOUS | Status: DC | PRN
  Administered 2014-01-18: 500 mg via INTRAVENOUS
  Filled 2014-01-18 (×2): qty 5

## 2014-01-18 MED ORDER — NEOSTIGMINE METHYLSULFATE 10 MG/10ML IV SOLN
INTRAVENOUS | Status: AC
  Filled 2014-01-18: qty 1

## 2014-01-18 MED ORDER — ONDANSETRON HCL 4 MG PO TABS
4.0000 mg | ORAL_TABLET | Freq: Four times a day (QID) | ORAL | Status: DC | PRN
  Administered 2014-01-20: 4 mg via ORAL
  Filled 2014-01-18: qty 1

## 2014-01-18 MED ORDER — EPHEDRINE SULFATE 50 MG/ML IJ SOLN
INTRAMUSCULAR | Status: DC | PRN
  Administered 2014-01-18: 10 mg via INTRAVENOUS
  Administered 2014-01-18: 5 mg via INTRAVENOUS

## 2014-01-18 MED ORDER — GLYCOPYRROLATE 0.2 MG/ML IJ SOLN
INTRAMUSCULAR | Status: AC
  Filled 2014-01-18: qty 3

## 2014-01-18 MED ORDER — BUPIVACAINE HCL (PF) 0.25 % IJ SOLN
INTRAMUSCULAR | Status: DC | PRN
  Administered 2014-01-18: 20 mL

## 2014-01-18 MED ORDER — SODIUM CHLORIDE 0.9 % IJ SOLN
INTRAMUSCULAR | Status: DC | PRN
  Administered 2014-01-18: 20 mL

## 2014-01-18 MED ORDER — TRANEXAMIC ACID 100 MG/ML IV SOLN
1000.0000 mg | INTRAVENOUS | Status: AC
  Administered 2014-01-18: 1000 mg via INTRAVENOUS
  Filled 2014-01-18: qty 10

## 2014-01-18 MED ORDER — MENTHOL 3 MG MT LOZG
1.0000 | LOZENGE | OROMUCOSAL | Status: DC | PRN
  Filled 2014-01-18: qty 9

## 2014-01-18 MED ORDER — SODIUM CHLORIDE 0.9 % IR SOLN
Status: DC | PRN
  Administered 2014-01-18: 500 mL

## 2014-01-18 MED ORDER — SODIUM CHLORIDE 0.9 % IR SOLN
Status: DC | PRN
  Administered 2014-01-18: 1000 mL

## 2014-01-18 MED ORDER — FENTANYL CITRATE 0.05 MG/ML IJ SOLN
INTRAMUSCULAR | Status: AC
  Filled 2014-01-18: qty 5

## 2014-01-18 MED ORDER — FENTANYL CITRATE 0.05 MG/ML IJ SOLN
INTRAMUSCULAR | Status: AC
  Filled 2014-01-18: qty 2

## 2014-01-18 MED ORDER — ACETAMINOPHEN 10 MG/ML IV SOLN
1000.0000 mg | Freq: Once | INTRAVENOUS | Status: AC
  Administered 2014-01-18: 1000 mg via INTRAVENOUS
  Filled 2014-01-18: qty 100

## 2014-01-18 MED ORDER — HYDROMORPHONE HCL 1 MG/ML IJ SOLN
1.0000 mg | INTRAMUSCULAR | Status: DC | PRN
  Administered 2014-01-18: 1 mg via INTRAVENOUS
  Filled 2014-01-18: qty 1

## 2014-01-18 MED ORDER — LACTATED RINGERS IV SOLN
INTRAVENOUS | Status: DC
Start: 2014-01-18 — End: 2014-01-20

## 2014-01-18 MED ORDER — PROPOFOL 10 MG/ML IV BOLUS
INTRAVENOUS | Status: AC
  Filled 2014-01-18: qty 20

## 2014-01-18 MED ORDER — BUPIVACAINE HCL (PF) 0.25 % IJ SOLN
INTRAMUSCULAR | Status: AC
  Filled 2014-01-18: qty 30

## 2014-01-18 MED ORDER — METHOCARBAMOL 500 MG PO TABS
500.0000 mg | ORAL_TABLET | Freq: Four times a day (QID) | ORAL | Status: DC | PRN
Start: 2014-01-18 — End: 2014-01-20
  Administered 2014-01-19: 500 mg via ORAL
  Filled 2014-01-18: qty 1

## 2014-01-18 MED ORDER — METOCLOPRAMIDE HCL 5 MG/ML IJ SOLN
5.0000 mg | Freq: Three times a day (TID) | INTRAMUSCULAR | Status: DC
  Administered 2014-01-18: 5 mg via INTRAVENOUS
  Administered 2014-01-18: 10 mg via INTRAVENOUS
  Filled 2014-01-18 (×5): qty 2

## 2014-01-18 MED ORDER — NEOSTIGMINE METHYLSULFATE 10 MG/10ML IV SOLN
INTRAVENOUS | Status: DC | PRN
  Administered 2014-01-18: 3 mg via INTRAVENOUS

## 2014-01-18 MED ORDER — LACTATED RINGERS IV SOLN: INTRAVENOUS | Status: DC

## 2014-01-18 MED ORDER — MIDAZOLAM HCL 5 MG/5ML IJ SOLN
INTRAMUSCULAR | Status: DC | PRN
  Administered 2014-01-18: 2 mg via INTRAVENOUS

## 2014-01-18 MED ORDER — FENTANYL CITRATE 0.05 MG/ML IJ SOLN
25.0000 ug | INTRAMUSCULAR | Status: DC | PRN
  Administered 2014-01-18: 50 ug via INTRAVENOUS

## 2014-01-18 MED ORDER — GLYCOPYRROLATE 0.2 MG/ML IJ SOLN
INTRAMUSCULAR | Status: DC | PRN
  Administered 2014-01-18: 0.2 mg via INTRAVENOUS
  Administered 2014-01-18: 0.6 mg via INTRAVENOUS

## 2014-01-18 SURGICAL SUPPLY — 68 items
BAG SPEC THK2 15X12 ZIP CLS (MISCELLANEOUS)
BAG ZIPLOCK 12X15 (MISCELLANEOUS) IMPLANT
BANDAGE ELASTIC 4 VELCRO ST LF (GAUZE/BANDAGES/DRESSINGS) ×3 IMPLANT
BANDAGE ELASTIC 6 VELCRO ST LF (GAUZE/BANDAGES/DRESSINGS) ×3 IMPLANT
BANDAGE ESMARK 6X9 LF (GAUZE/BANDAGES/DRESSINGS) ×1 IMPLANT
BLADE SAG 18X100X1.27 (BLADE) ×3 IMPLANT
BLADE SAW SGTL 11.0X1.19X90.0M (BLADE) ×3 IMPLANT
BNDG CMPR 9X6 STRL LF SNTH (GAUZE/BANDAGES/DRESSINGS) ×1
BNDG ESMARK 6X9 LF (GAUZE/BANDAGES/DRESSINGS) ×3
BONE CEMENT GENTAMICIN (Cement) ×6 IMPLANT
CAP KNEE TOTAL 3 SIGMA ×2 IMPLANT
CEMENT BONE GENTAMICIN 40 (Cement) ×2 IMPLANT
CUFF TOURN SGL QUICK 34 (TOURNIQUET CUFF) ×3
CUFF TRNQT CYL 34X4X40X1 (TOURNIQUET CUFF) ×1 IMPLANT
DRAPE EXTREMITY T 121X128X90 (DRAPE) ×3 IMPLANT
DRAPE POUCH INSTRU U-SHP 10X18 (DRAPES) ×3 IMPLANT
DRAPE U-SHAPE 47X51 STRL (DRAPES) ×3 IMPLANT
DRSG AQUACEL AG ADV 3.5X10 (GAUZE/BANDAGES/DRESSINGS) ×3 IMPLANT
DRSG TEGADERM 4X4.75 (GAUZE/BANDAGES/DRESSINGS) ×3 IMPLANT
DURAPREP 26ML APPLICATOR (WOUND CARE) ×3 IMPLANT
ELECT REM PT RETURN 9FT ADLT (ELECTROSURGICAL) ×3
ELECTRODE REM PT RTRN 9FT ADLT (ELECTROSURGICAL) ×1 IMPLANT
EVACUATOR 1/8 PVC DRAIN (DRAIN) ×3 IMPLANT
FACESHIELD WRAPAROUND (MASK) ×15 IMPLANT
FACESHIELD WRAPAROUND OR TEAM (MASK) ×5 IMPLANT
GAUZE SPONGE 2X2 8PLY STRL LF (GAUZE/BANDAGES/DRESSINGS) ×1 IMPLANT
GLOVE BIO SURGEON STRL SZ7.5 (GLOVE) ×2 IMPLANT
GLOVE BIO SURGEON STRL SZ8 (GLOVE) ×2 IMPLANT
GLOVE BIOGEL PI IND STRL 6.5 (GLOVE) ×1 IMPLANT
GLOVE BIOGEL PI IND STRL 7.5 (GLOVE) IMPLANT
GLOVE BIOGEL PI IND STRL 8 (GLOVE) ×1 IMPLANT
GLOVE BIOGEL PI IND STRL 8.5 (GLOVE) IMPLANT
GLOVE BIOGEL PI INDICATOR 6.5 (GLOVE) ×4
GLOVE BIOGEL PI INDICATOR 7.5 (GLOVE) ×2
GLOVE BIOGEL PI INDICATOR 8 (GLOVE) ×2
GLOVE BIOGEL PI INDICATOR 8.5 (GLOVE) ×2
GLOVE ECLIPSE 8.0 STRL XLNG CF (GLOVE) ×4 IMPLANT
GLOVE SURG SS PI 6.5 STRL IVOR (GLOVE) ×3 IMPLANT
GOWN STRL REUS W/TWL LRG LVL3 (GOWN DISPOSABLE) ×3 IMPLANT
GOWN STRL REUS W/TWL XL LVL3 (GOWN DISPOSABLE) ×7 IMPLANT
HANDPIECE INTERPULSE COAX TIP (DISPOSABLE) ×3
IMMOBILIZER KNEE 20 (SOFTGOODS) ×3
IMMOBILIZER KNEE 20 THIGH 36 (SOFTGOODS) ×1 IMPLANT
KIT BASIN OR (CUSTOM PROCEDURE TRAY) ×3 IMPLANT
LIQUID BAND (GAUZE/BANDAGES/DRESSINGS) ×3 IMPLANT
MANIFOLD NEPTUNE II (INSTRUMENTS) ×3 IMPLANT
NEEDLE HYPO 22GX1.5 SAFETY (NEEDLE) ×6 IMPLANT
PACK TOTAL JOINT (CUSTOM PROCEDURE TRAY) ×3 IMPLANT
POSITIONER SURGICAL ARM (MISCELLANEOUS) ×3 IMPLANT
SET HNDPC FAN SPRY TIP SCT (DISPOSABLE) ×1 IMPLANT
SET PAD KNEE POSITIONER (MISCELLANEOUS) ×3 IMPLANT
SPONGE GAUZE 2X2 STER 10/PKG (GAUZE/BANDAGES/DRESSINGS) ×2
SPONGE SURGIFOAM ABS GEL 100 (HEMOSTASIS) ×3 IMPLANT
SUCTION FRAZIER 12FR DISP (SUCTIONS) ×3 IMPLANT
SUT BONE WAX W31G (SUTURE) ×3 IMPLANT
SUT MNCRL AB 4-0 PS2 18 (SUTURE) ×3 IMPLANT
SUT VIC AB 1 CT1 27 (SUTURE) ×6
SUT VIC AB 1 CT1 27XBRD ANTBC (SUTURE) ×2 IMPLANT
SUT VIC AB 2-0 CT1 27 (SUTURE) ×9
SUT VIC AB 2-0 CT1 TAPERPNT 27 (SUTURE) ×3 IMPLANT
SUT VLOC 180 0 24IN GS25 (SUTURE) ×3 IMPLANT
SYR 20CC LL (SYRINGE) ×9 IMPLANT
TOWEL OR 17X26 10 PK STRL BLUE (TOWEL DISPOSABLE) ×3 IMPLANT
TOWEL OR NON WOVEN STRL DISP B (DISPOSABLE) ×2 IMPLANT
TOWER CARTRIDGE SMART MIX (DISPOSABLE) ×3 IMPLANT
TRAY FOLEY CATH 16FRSI W/METER (SET/KITS/TRAYS/PACK) ×2 IMPLANT
WATER STERILE IRR 1500ML POUR (IV SOLUTION) ×3 IMPLANT
WRAP KNEE MAXI GEL POST OP (GAUZE/BANDAGES/DRESSINGS) ×3 IMPLANT

## 2014-01-18 NOTE — Op Note (Signed)
[name], [name]                ACCOUNT NO.:  [id number]  MEDICAL RECORD NO.:  [id number]  LOCATION:  7289                         FACILITY:  [facility]  PHYSICIAN:  Kipp Brood. Jaxston Chohan, M.D.DATE OF BIRTH:  Oct 14, 1950  DATE OF PROCEDURE: DATE OF DISCHARGE:                              OPERATIVE REPORT   He did not receive penicillin.  He received vancomycin.  He is allergic to PENICILLIN, so he had 1 g of vancomycin preop.          ______________________________ Kipp Brood. Gladstone Lighter, M.D.     RAG/MEDQ  D:  01/18/2014  T:  01/18/2014  Job:  [id number]

## 2014-01-18 NOTE — Anesthesia Preprocedure Evaluation (Signed)
Anesthesia Evaluation  Patient identified by MRN, date of birth, ID band Patient awake    Reviewed: Allergy & Precautions, H&P , NPO status , Patient's Chart, lab work & pertinent test results  Airway Mallampati: II TM Distance: >3 FB Neck ROM: Full    Dental no notable dental hx.    Pulmonary neg pulmonary ROS, former smoker,  breath sounds clear to auscultation  Pulmonary exam normal       Cardiovascular negative cardio ROS  Rhythm:Regular Rate:Normal     Neuro/Psych negative neurological ROS  negative psych ROS   GI/Hepatic negative GI ROS, Neg liver ROS,   Endo/Other  negative endocrine ROS  Renal/GU negative Renal ROS  negative genitourinary   Musculoskeletal negative musculoskeletal ROS (+)   Abdominal   Peds negative pediatric ROS (+)  Hematology negative hematology ROS (+)   Anesthesia Other Findings   Reproductive/Obstetrics negative OB ROS                          Anesthesia Physical Anesthesia Plan  ASA: II  Anesthesia Plan: General   Post-op Pain Management:    Induction: Intravenous  Airway Management Planned: Oral ETT  Additional Equipment:   Intra-op Plan:   Post-operative Plan: Extubation in OR  Informed Consent: I have reviewed the patients History and Physical, chart, labs and discussed the procedure including the risks, benefits and alternatives for the proposed anesthesia with the patient or authorized representative who has indicated his/her understanding and acceptance.   Dental advisory given  Plan Discussed with: CRNA  Anesthesia Plan Comments:         Anesthesia Quick Evaluation  

## 2014-01-18 NOTE — Progress Notes (Signed)
Utilization review completed.  

## 2014-01-18 NOTE — Transfer of Care (Signed)
Immediate Anesthesia Transfer of Care Note  Patient: Alec Johnson  Procedure(s) Performed: Procedure(s) (LRB): RIGHT TOTAL KNEE ARTHROPLASTY (Right)  Patient Location: PACU  Anesthesia Type: General  Level of Consciousness: sedated, patient cooperative and responds to stimulation  Airway & Oxygen Therapy: Patient Spontanous Breathing and Patient connected to face mask oxgen  Post-op Assessment: Report given to PACU RN and Post -op Vital signs reviewed and stable  Post vital signs: Reviewed and stable  Complications: No apparent anesthesia complications

## 2014-01-18 NOTE — Interval H&P Note (Signed)
History and Physical Interval Note:  01/18/2014 11:44 AM  Alec Johnson  has presented today for surgery, with the diagnosis of osteoarthritis right knee   The various methods of treatment have been discussed with the patient and family. After consideration of risks, benefits and other options for treatment, the patient has consented to  Procedure(s): RIGHT TOTAL KNEE ARTHROPLASTY (Right) as a surgical intervention .  The patient's history has been reviewed, patient examined, no change in status, stable for surgery.  I have reviewed the patient's chart and labs.  Questions were answered to the patient's satisfaction.     Alexzandra Bilton A

## 2014-01-18 NOTE — Anesthesia Postprocedure Evaluation (Signed)
  Anesthesia Post-op Note  Patient: Alec Johnson  Procedure(s) Performed: Procedure(s) (LRB): RIGHT TOTAL KNEE ARTHROPLASTY (Right)  Patient Location: PACU  Anesthesia Type: General  Level of Consciousness: awake and alert   Airway and Oxygen Therapy: Patient Spontanous Breathing  Post-op Pain: mild  Post-op Assessment: Post-op Vital signs reviewed, Patient's Cardiovascular Status Stable, Respiratory Function Stable, Patent Airway and No signs of Nausea or vomiting  Last Vitals:  Filed Vitals:   01/18/14 1545  BP: 132/61  Pulse: 54  Temp: 36.7 C  Resp: 16    Post-op Vital Signs: stable   Complications: No apparent anesthesia complications

## 2014-01-18 NOTE — Discharge Instructions (Addendum)
Walk with your walker. Weightbearing as tolerated [place] will follow you at home for your therapy. Do not change your dressing unless it appears compromised. Shower only, no tub bath. Call if any temperatures greater than 101 or any wound complications: [phone] during the day and ask for Dr. Charlestine Night nurse, Brunilda Payor.  ============================================================================================================   Information on my medicine - XARELTO (Rivaroxaban)  This medication education was reviewed with me or my healthcare representative as part of my discharge preparation.  The pharmacist that spoke with me during my hospital stay was:  Lashunta Frieden, Julieta Bellini, RPH  Why was Xarelto prescribed for you? Xarelto was prescribed for you to reduce the risk of blood clots forming after orthopedic surgery. The medical term for these abnormal blood clots is venous thromboembolism (VTE).  What do you need to know about xarelto ? Take your Xarelto ONCE DAILY at the same time every day. You may take it either with or without food.  If you have difficulty swallowing the tablet whole, you may crush it and mix in applesauce just prior to taking your dose.  Take Xarelto exactly as prescribed by your doctor and DO NOT stop taking Xarelto without talking to the doctor who prescribed the medication.  Stopping without other VTE prevention medication to take the place of Xarelto may increase your risk of developing a clot.  After discharge, you should have regular check-up appointments with your healthcare provider that is prescribing your Xarelto.    What do you do if you miss a dose? If you miss a dose, take it as soon as you remember on the same day then continue your regularly scheduled once daily regimen the next day. Do not take two doses of Xarelto on the same day.   Important Safety Information A possible side effect of Xarelto is bleeding. You should call  your healthcare provider right away if you experience any of the following: ? Bleeding from an injury or your nose that does not stop. ? Unusual colored urine (red or dark brown) or unusual colored stools (red or black). ? Unusual bruising for unknown reasons. ? A serious fall or if you hit your head (even if there is no bleeding).  Some medicines may interact with Xarelto and might increase your risk of bleeding while on Xarelto. To help avoid this, consult your healthcare provider or pharmacist prior to using any new prescription or non-prescription medications, including herbals, vitamins, non-steroidal anti-inflammatory drugs (NSAIDs) and supplements.  This website has more information on Xarelto: [email or web address]

## 2014-01-18 NOTE — Brief Op Note (Signed)
01/18/2014  2:12 PM  PATIENT:  Alec Johnson  63 y.o. male  PRE-OPERATIVE DIAGNOSIS:Primary   osteoarthritis right knee   POST-OPERATIVE DIAGNOSIS:Primary   osteoarthritis right knee   PROCEDURE:  Procedure(s): RIGHT TOTAL KNEE ARTHROPLASTY (Right)  SURGEON:  Surgeon(s) and Role:    * Tobi Bastos, MD - Primary  PHYSICIAN ASSISTANT:Amber [place] PA  ASSISTANTS: Ardeen Jourdain PA  ANESTHESIA:   general  EBL:  Total I/O In: 1000 [I.V.:1000] Out: 250 [Urine:200; Blood:50]  BLOOD ADMINISTERED:none  DRAINS: (One) Hemovact drain(s) in the Right Knee with  Suction Open   LOCAL MEDICATIONS USED:  MARCAINE  20cc of Plain 0.25% and Exparel20ccmixed with 20cc Normal Saline   SPECIMEN:  No Specimen  DISPOSITION OF SPECIMEN:  N/A  COUNTS:  YES  TOURNIQUET:  * Missing tourniquet times found for documented tourniquets in log:  [id number] *  DICTATION: .Other Dictation: Dictation Number [id number]  PLAN OF CARE: Admit to inpatient   PATIENT DISPOSITION:  Stable in OR   Delay start of Pharmacological VTE agent (>24hrs) due to surgical blood loss or risk of bleeding: yes

## 2014-01-19 ENCOUNTER — Encounter (HOSPITAL_COMMUNITY): Payer: Self-pay | Admitting: Orthopedic Surgery

## 2014-01-19 LAB — URINALYSIS, ROUTINE W REFLEX MICROSCOPIC
Bilirubin Urine: NEGATIVE
Glucose, UA: NEGATIVE mg/dL
KETONES UR: NEGATIVE mg/dL
Leukocytes, UA: NEGATIVE
NITRITE: NEGATIVE
Protein, ur: NEGATIVE mg/dL
SPECIFIC GRAVITY, URINE: 1.012 (ref 1.005–1.030)
Urobilinogen, UA: 0.2 mg/dL (ref 0.0–1.0)
pH: 5.5 (ref 5.0–8.0)

## 2014-01-19 LAB — BASIC METABOLIC PANEL
Anion gap: 7 (ref 5–15)
BUN: 13 mg/dL (ref 6–23)
CO2: 30 mmol/L (ref 19–32)
Calcium: 8.5 mg/dL (ref 8.4–10.5)
Chloride: 102 mEq/L (ref 96–112)
Creatinine, Ser: 0.84 mg/dL (ref 0.50–1.35)
GFR calc Af Amer: >90 mL/min (ref >90)
GFR calc non Af Amer: 90 mL/min — ABNORMAL LOW (ref >90)
Glucose, Bld: 128 mg/dL — ABNORMAL HIGH (ref 70–99)
Potassium: 4.3 mmol/L (ref 3.5–5.1)
Sodium: 139 mmol/L (ref 135–145)

## 2014-01-19 LAB — CBC
HCT: 38.2 % — ABNORMAL LOW (ref 39.0–52.0)
Hemoglobin: 12.5 g/dL — ABNORMAL LOW (ref 13.0–17.0)
MCH: 31.6 pg (ref 26.0–34.0)
MCHC: 32.7 g/dL (ref 30.0–36.0)
MCV: 96.5 fL (ref 78.0–100.0)
PLATELETS: 162 10*3/uL (ref 150–400)
RBC: 3.96 MIL/uL — ABNORMAL LOW (ref 4.22–5.81)
RDW: 12.3 % (ref 11.5–15.5)
WBC: 13 10*3/uL — AB (ref 4.0–10.5)

## 2014-01-19 LAB — URINE MICROSCOPIC-ADD ON

## 2014-01-19 MED ORDER — METOCLOPRAMIDE HCL 5 MG/ML IJ SOLN
5.0000 mg | Freq: Three times a day (TID) | INTRAMUSCULAR | Status: DC | PRN
  Administered 2014-01-19: 10 mg via INTRAVENOUS
  Filled 2014-01-19: qty 2

## 2014-01-19 NOTE — Progress Notes (Signed)
Physical Therapy Treatment Patient Details Name: Alec Johnson MRN: [id number] DOB: 1951-01-14 Today's Date: 01/19/2014    History of Present Illness R TKR    PT Comments    Attempted ambulation again at pt request.  Pt continues ltd by onset of nausea with increased activity.  RN aware.  Follow Up Recommendations  Home health PT     Equipment Recommendations  Rolling walker with 5" wheels    Recommendations for Other Services OT consult     Precautions / Restrictions Precautions Precautions: Knee;Fall Required Braces or Orthoses: Knee Immobilizer - Right Knee Immobilizer - Right: Discontinue once straight leg raise with < 10 degree lag Restrictions Weight Bearing Restrictions: No Other Position/Activity Restrictions: WBAT    Mobility  Bed Mobility Overal bed mobility: Needs Assistance Bed Mobility: Sit to Supine       Sit to supine: Min assist   General bed mobility comments: cues for sequence and use of L LE to self assist  Transfers Overall transfer level: Needs assistance Equipment used: Rolling walker (2 wheeled) Transfers: Sit to/from Stand Sit to Stand: Min assist         General transfer comment: cues for LE management and use of UEs to self assist  Ambulation/Gait Ambulation/Gait assistance: Min assist Ambulation Distance (Feet): 10 Feet Assistive device: Rolling walker (2 wheeled) Gait Pattern/deviations: Step-to pattern;Decreased step length - left;Decreased step length - right;Shuffle Gait velocity: decr   General Gait Details: cues for sequence, posture and position from RW - ltd by nausea   Stairs            Wheelchair Mobility    Modified Rankin (Stroke Patients Only)       Balance                                    Cognition Arousal/Alertness: Awake/alert Behavior During Therapy: WFL for tasks assessed/performed Overall Cognitive Status: Within Functional Limits for tasks assessed                       Exercises      General Comments        Pertinent Vitals/Pain Pain Assessment: 0-10 Pain Score: 6  Pain Location: R knee/thigh Pain Descriptors / Indicators: Aching;Sore Pain Intervention(s): Limited activity within patient's tolerance;Monitored during session;Premedicated before session;Ice applied    Home Living                      Prior Function            PT Goals (current goals can now be found in the care plan section) Acute Rehab PT Goals Patient Stated Goal: GEt back in the garden by spring PT Goal Formulation: With patient Time For Goal Achievement: 01/26/14 Potential to Achieve Goals: Good Progress towards PT goals: Progressing toward goals    Frequency  7X/week    PT Plan Current plan remains appropriate    Co-evaluation             End of Session Equipment Utilized During Treatment: Right knee immobilizer;Gait belt Activity Tolerance: Other (comment) Patient left: with call bell/phone within reach;with family/visitor present;in bed     Time: 9024-0973 PT Time Calculation (min) (ACUTE ONLY): 14 min  Charges:  $Gait Training: 8-22 mins                    G Codes:  Alec Johnson 01/19/2014, 5:05 PM

## 2014-01-19 NOTE — Op Note (Signed)
NAMESIDDHARTHA, HOBACK                ACCOUNT NO.:  [id number]  MEDICAL RECORD NO.:  [id number]  LOCATION:  8144                         FACILITY:  [facility]  PHYSICIAN:  Kipp Brood. Rashawnda Gaba, M.D.DATE OF BIRTH:  02/08/1950  DATE OF PROCEDURE:  01/18/2014 DATE OF DISCHARGE:                              OPERATIVE REPORT   PREOPERATIVE DIAGNOSIS:  Osteoarthritis, primary type, right knee.  POSTOPERATIVE DIAGNOSIS:  Osteoarthritis, primary type, right knee with bone on bone.  OPERATION:  Right total knee arthroplasty, utilized DePuy system.  The sizes used was a size 5 right femoral component posterior cruciate sacrificing type 2 tray, tibial tray was a size 4, the insert was a size 5, 10-mm thickness rotating platform.  The patella was a size 41 patella with 3 pegs.  Note, all 3 components were cemented and gentamicin was used in the cement.  SURGEON:  Kipp Brood. Gladstone Lighter, M.D.  OPERATIVE ASSISTANT:  Ardeen Jourdain, PA  DESCRIPTION OF PROCEDURE:  Under general anesthesia, routine orthopedic prep and draping of the right lower extremity was carried out.  The appropriate time-out was first carried out.  I also marked the appropriate right leg in the holding area.  At this time, the patient had 2 g of IV Ancef.  Under general anesthesia __________ and after sterile prep and draping was done, the leg was exsanguinated with Esmarch, tourniquet was elevated and exsanguinated to 325 mmHg.  The knee was flexed.  The knee was placed in a DeMayo knee holder in an anterior approach to the knee was carried out.  Two flaps were created. I then carried out a median parapatellar incision.  The knee was flexed as I mentioned.  I did medial and lateral meniscectomies and excised the anterior and posterior cruciate ligaments.  At this particular point, initial drill hole was made in the intercondylar notch.  The canal finder was inserted.  I thoroughly irrigated out the canal and then the next jig was  inserted.  I removed 30-mm thickness off the distal femur at this time.  Following that, I then went on to measure the femur to be a size 5 right.  We then went prepared the tibia in the usual fashion. I removed 8-mm thickness off the affected medial side of the tibia.  The knee was extremely tight under severe contractures.  Those contractures were released.  I then utilized the drill hole done in the tibial plateau.  The guide rod was inserted.  First of all, the canal finder was inserted.  I irrigated the canal and then the guide rod and removed 8-mm thickness off the medial side.  After this was done, we irrigated out the area.  We removed the posterior spurs off the femur.  The spacer blocks were inserted.  The spacer block the best that we have is for 10- mm thickness of prosthesis.  After that, we then went on to prepare the tibia further and by cutting our keel cut the tibia, then cut my notch, cut out the distal femur.  The trial components were inserted.  The knee was reduced.  We had good stability and good flexion and extension.  I then did a resurfacing  procedure on the patella for a size 41 patella. Three drill holes were made in the patella.  The trial components were removed.  We water picked out the knee, dried the knee out.  I then cemented all 3 components in simultaneously with gentamicin in the cement.  After the cement was dried and hardened, we then removed all loose pieces of cement.  I then water picked the knee to make sure the loose pieces of cement were all removed.  At this time, I inserted some thrombin-soaked Gelfoam posteriorly.  Then, I went on to insert my permanent rotating platform size 5, 10 mm thickness insert, reduced the knee, and had good motion and good stability.  The Hemovac drain was inserted.  The knee was then closed in layers in usual fashion.  Sterile dressings were applied.          ______________________________ Kipp Brood Gladstone Lighter,  M.D.     RAG/MEDQ  D:  01/18/2014  T:  01/18/2014  Job:  [id number]

## 2014-01-19 NOTE — Progress Notes (Signed)
Physical Therapy Treatment Patient Details Name: ZYMIRE TURNBO MRN: [id number] DOB: 30-Sep-1950 Today's Date: 02/01/14    History of Present Illness R TKR    PT Comments    Pt motivated but ltd by onset nausea with ambulation  Follow Up Recommendations  Home health PT     Equipment Recommendations  Rolling walker with 5" wheels    Recommendations for Other Services OT consult     Precautions / Restrictions Precautions Precautions: Knee;Fall Required Braces or Orthoses: Knee Immobilizer - Right Knee Immobilizer - Right: Discontinue once straight leg raise with < 10 degree lag Restrictions Weight Bearing Restrictions: No Other Position/Activity Restrictions: WBAT    Mobility  Bed Mobility                  Transfers Overall transfer level: Needs assistance Equipment used: Rolling walker (2 wheeled) Transfers: Sit to/from Stand Sit to Stand: Min assist         General transfer comment: cues for LE management and use of UEs to self assist  Ambulation/Gait Ambulation/Gait assistance: Min assist Ambulation Distance (Feet): 10 Feet Assistive device: Rolling walker (2 wheeled) Gait Pattern/deviations: Step-to pattern;Decreased step length - right;Decreased step length - left;Shuffle Gait velocity: decr   General Gait Details: cues for sequence, posture and position from RW - ltd by nausea   Stairs            Wheelchair Mobility    Modified Rankin (Stroke Patients Only)       Balance                                    Cognition Arousal/Alertness: Awake/alert Behavior During Therapy: WFL for tasks assessed/performed Overall Cognitive Status: Within Functional Limits for tasks assessed                      Exercises      General Comments        Pertinent Vitals/Pain Pain Assessment: 0-10 Pain Score: 6  Pain Location: R knee Pain Descriptors / Indicators: Aching;Sore Pain Intervention(s): Limited activity  within patient's tolerance;Monitored during session;Premedicated before session;Ice applied    Home Living                      Prior Function            PT Goals (current goals can now be found in the care plan section) Acute Rehab PT Goals Patient Stated Goal: GEt back in the garden by spring PT Goal Formulation: With patient Time For Goal Achievement: 01/26/14 Potential to Achieve Goals: Good Progress towards PT goals: Progressing toward goals    Frequency  7X/week    PT Plan Current plan remains appropriate    Co-evaluation             End of Session Equipment Utilized During Treatment: Right knee immobilizer;Gait belt Activity Tolerance: Other (comment) (nausea) Patient left: in chair;with call bell/phone within reach;with family/visitor present     Time: 1937-9024 PT Time Calculation (min) (ACUTE ONLY): 20 min  Charges:  $Gait Training: 8-22 mins                    G Codes:      Arianah Torgeson 01-Feb-2014, 5:03 PM

## 2014-01-19 NOTE — Care Management Note (Signed)
    Page 1 of 2   01/19/2014     12:04:49 PM CARE MANAGEMENT NOTE 01/19/2014  Patient:  ANTAWAN, MCHUGH   Account Number:  [id number]  Date Initiated:  01/19/2014  Documentation initiated by:  [facility]  Subjective/Objective Assessment:   adm: RIGHT TOTAL KNEE ARTHROPLASTY (Right)     Action/Plan:   discharge planning   Anticipated DC Date:  01/20/2014   Anticipated DC Plan:  [place]  CM consult      [facility] Choice  HOME HEALTH   Choice offered to / List presented to:  C-1 Patient   DME arranged  Vassie Moselle      DME agency  [place] arranged  [place]   Status of service:  Completed, signed off Medicare Important Message given?   (If response is "NO", the following Medicare IM given date fields will be blank) Date Medicare IM given:   Medicare IM given by:   Date Additional Medicare IM given:   Additional Medicare IM given by:    Discharge Disposition:  [place]  Per UR Regulation:    If discussed at Long Length of Stay Meetings, dates discussed:    Comments:  01/19/14 07:45 CM met with pt in room to offerchoice of home health agency.  Pt chooses [facility] to render HHPT. Address and contact information verified by pt.  Referral given to [facility] rep, Tim (on unit).  CM called [facility] DME rep, Lecretia to please deliver the rolling walker to the room prior to discharge.  No other CM needs were communicated.  Mariane Masters, BSN, CM [phone].

## 2014-01-19 NOTE — Progress Notes (Signed)
Pt. had difficulty voiding after foley cath removed at 9:40 AM. Pt. Voided 25cc at 4:30, 75cc at 6pm &25 cc at6:55 pm.I&O cath done at [facility]  With 310 cc  Clear yellow urine obtained. Pt. was much more comfortable. Po fluids encouraged.

## 2014-01-19 NOTE — Progress Notes (Signed)
Subjective: 1 Day Post-Op Procedure(s) (LRB): RIGHT TOTAL KNEE ARTHROPLASTY (Right) Patient reports pain as 3 on 0-10 scale.  Doing well. Hemovac DCd.   Objective: Vital signs in last 24 hours: Temp:  [97.6 F (36.4 C)-99.4 F (37.4 C)] 99.4 F (37.4 C) (12/31 0704) Pulse Rate:  [44-69] 55 (12/31 0704) Resp:  [7-97] 16 (12/31 0704) BP: (110-142)/(54-86) 110/55 mmHg (12/31 0704) SpO2:  [3 %-100 %] 98 % (12/31 0704) Weight:  [105.235 kg (232 lb)] 105.235 kg (232 lb) (12/30 1618)  Intake/Output from previous day: 12/30 0701 - 12/31 0700 In: 4470 [P.O.:720; I.V.:3500; IV Piggyback:250] Out: 1320 [Urine:1250; Drains:20; Blood:50] Intake/Output this shift: Total I/O In: -  Out: 2000 [Urine:2000]   Recent Labs  01/19/14 0430  HGB 12.5*    Recent Labs  01/19/14 0430  WBC 13.0*  RBC 3.96*  HCT 38.2*  PLT 162    Recent Labs  01/19/14 0430  NA 139  K 4.3  CL 102  CO2 30  BUN 13  CREATININE 0.84  GLUCOSE 128*  CALCIUM 8.5   No results for input(s): LABPT, INR in the last 72 hours.  Dorsiflexion/Plantar flexion intact No cellulitis present Compartment soft  Assessment/Plan: 1 Day Post-Op Procedure(s) (LRB): RIGHT TOTAL KNEE ARTHROPLASTY (Right) Up with therapy. DC tomorrow.  Alec Johnson A 01/19/2014, 7:22 AM

## 2014-01-19 NOTE — Evaluation (Signed)
Physical Therapy Evaluation Patient Details Name: Alec Johnson MRN: [id number] DOB: 04-23-1950 Today's Date: 01/19/2014   History of Present Illness  R TKR  Clinical Impression  Pt s/p R TKR presents with decreased R LE strength/ROM and post op pain limiting functional mobility.  Pt should progress well to d.c home with family assist and HHPT follow up.    Follow Up Recommendations Home health PT    Equipment Recommendations  Rolling walker with 5" wheels    Recommendations for Other Services OT consult     Precautions / Restrictions Precautions Precautions: Knee;Fall Precaution Comments: Dizzy on eval Required Braces or Orthoses: Knee Immobilizer - Right Knee Immobilizer - Right: Discontinue once straight leg raise with < 10 degree lag Restrictions Weight Bearing Restrictions: No Other Position/Activity Restrictions: WBAT      Mobility  Bed Mobility Overal bed mobility: Needs Assistance Bed Mobility: Supine to Sit     Supine to sit: Min assist     General bed mobility comments: cues for sequence and use of L LE to self assist  Transfers Overall transfer level: Needs assistance Equipment used: Rolling walker (2 wheeled) Transfers: Sit to/from Stand Sit to Stand: Min assist         General transfer comment: cues for LE management and use of UEs to self assist  Ambulation/Gait Ambulation/Gait assistance: Min assist Ambulation Distance (Feet): 5 Feet Assistive device: Rolling walker (2 wheeled) Gait Pattern/deviations: Step-to pattern;Decreased step length - right;Decreased step length - left;Shuffle;Trunk flexed Gait velocity: dec   General Gait Details: cues for sequence, posture and position from RW - ltd by increasing dizziness  Stairs            Wheelchair Mobility    Modified Rankin (Stroke Patients Only)       Balance                                             Pertinent Vitals/Pain Pain Assessment: 0-10 Pain  Score: 6  Pain Location: R knee and thigh Pain Descriptors / Indicators: Aching;Sore Pain Intervention(s): Limited activity within patient's tolerance;Monitored during session;Premedicated before session;Ice applied    Home Living Family/patient expects to be discharged to:: Private residence Living Arrangements: Spouse/significant other Available Help at Discharge: Family Type of Home: House Home Access: Stairs to enter Entrance Stairs-Rails: None Technical brewer of Steps: 1 Home Layout: One level Home Equipment: Cane - single point      Prior Function Level of Independence: Independent               Hand Dominance        Extremity/Trunk Assessment   Upper Extremity Assessment: Overall WFL for tasks assessed           Lower Extremity Assessment: RLE deficits/detail RLE Deficits / Details: 2+/5 quads with AAROM at knee -10 - 40    Cervical / Trunk Assessment: Normal  Communication   Communication: No difficulties  Cognition Arousal/Alertness: Awake/alert Behavior During Therapy: WFL for tasks assessed/performed Overall Cognitive Status: Within Functional Limits for tasks assessed                      General Comments      Exercises Total Joint Exercises Ankle Circles/Pumps: AROM;Both;15 reps;Supine Quad Sets: AROM;Both;10 reps;Supine Heel Slides: AAROM;10 reps;Supine;Right Straight Leg Raises: AAROM;Right;10 reps;Supine      Assessment/Plan  PT Assessment Patient needs continued PT services  PT Diagnosis Difficulty walking   PT Problem List Decreased strength;Decreased range of motion;Decreased activity tolerance;Decreased mobility;Decreased knowledge of use of DME;Pain  PT Treatment Interventions DME instruction;Gait training;Stair training;Functional mobility training;Therapeutic activities;Therapeutic exercise;Patient/family education   PT Goals (Current goals can be found in the Care Plan section) Acute Rehab PT Goals Patient  Stated Goal: GEt back in the garden by spring PT Goal Formulation: With patient Time For Goal Achievement: 01/26/14 Potential to Achieve Goals: Good    Frequency 7X/week   Barriers to discharge        Co-evaluation               End of Session Equipment Utilized During Treatment: Right knee immobilizer;Gait belt Activity Tolerance: Other (comment) (ltd by dizziness) Patient left: in chair;with call bell/phone within reach;with family/visitor present Nurse Communication: Mobility status (dizziness)         Time: 4580-9983 PT Time Calculation (min) (ACUTE ONLY): 35 min   Charges:   PT Evaluation $Initial PT Evaluation Tier I: 1 Procedure PT Treatments $Gait Training: 8-22 mins $Therapeutic Exercise: 8-22 mins   PT G Codes:        Micajah Dennin 02/07/2014, 11:11 AM

## 2014-01-20 LAB — CBC
HCT: 34.6 % — ABNORMAL LOW (ref 39.0–52.0)
HEMOGLOBIN: 11.1 g/dL — AB (ref 13.0–17.0)
MCH: 31.4 pg (ref 26.0–34.0)
MCHC: 32.1 g/dL (ref 30.0–36.0)
MCV: 98 fL (ref 78.0–100.0)
Platelets: 155 10*3/uL (ref 150–400)
RBC: 3.53 MIL/uL — ABNORMAL LOW (ref 4.22–5.81)
RDW: 12.8 % (ref 11.5–15.5)
WBC: 14.4 10*3/uL — ABNORMAL HIGH (ref 4.0–10.5)

## 2014-01-20 LAB — BASIC METABOLIC PANEL
ANION GAP: 8 (ref 5–15)
BUN: 16 mg/dL (ref 6–23)
CHLORIDE: 100 meq/L (ref 96–112)
CO2: 30 mmol/L (ref 19–32)
Calcium: 8.5 mg/dL (ref 8.4–10.5)
Creatinine, Ser: 0.83 mg/dL (ref 0.50–1.35)
GFR calc Af Amer: >90 mL/min (ref >90)
GFR calc non Af Amer: >90 mL/min (ref >90)
Glucose, Bld: 145 mg/dL — ABNORMAL HIGH (ref 70–99)
Potassium: 3.8 mmol/L (ref 3.5–5.1)
SODIUM: 138 mmol/L (ref 135–145)

## 2014-01-20 NOTE — Progress Notes (Signed)
Pt urinating well now.

## 2014-01-20 NOTE — Progress Notes (Signed)
Alec Johnson  MRN: [id number] DOB/Age: 1950-02-11 64 y.o. Physician: Rada Hay Procedure: Procedure(s) (LRB): RIGHT TOTAL KNEE ARTHROPLASTY (Right)     Subjective: Up in chair after working with PT. Voiding better. Ready for DC  Vital Signs Temp:  [97.9 F (36.6 C)-100.1 F (37.8 C)] 98 F (36.7 C) (01/01 0435) Pulse Rate:  [55-72] 60 (01/01 0435) Resp:  [16-20] 20 (01/01 0435) BP: (107-131)/(51-57) 124/57 mmHg (01/01 0435) SpO2:  [92 %-97 %] 92 % (01/01 0435)  Lab Results  Recent Labs  01/19/14 0430 01/20/14 0440  WBC 13.0* 14.4*  HGB 12.5* 11.1*  HCT 38.2* 34.6*  PLT 162 155   BMET  Recent Labs  01/19/14 0430 01/20/14 0440  NA 139 138  K 4.3 3.8  CL 102 100  CO2 30 30  GLUCOSE 128* 145*  BUN 13 16  CREATININE 0.84 0.83  CALCIUM 8.5 8.5   INR  Date Value Ref Range Status  01/10/2014 1.01 0.00 - 1.49 Final     Exam Right knee dressing dry, drain site dry        Plan DC home rx in chart  [facility] for Dr.Kevin Supple 01/20/2014, 9:56 AM

## 2014-01-20 NOTE — Progress Notes (Signed)
Physical Therapy Treatment Patient Details Name: ISIAC BREIGHNER MRN: [id number] DOB: Aug 15, 1950 Today's Date: 01/20/2014    History of Present Illness R TKR    PT Comments    Marked progress from yesterday.  Pt eager for d/c home.  Follow Up Recommendations  Home health PT     Equipment Recommendations  Rolling walker with 5" wheels    Recommendations for Other Services OT consult     Precautions / Restrictions Precautions Precautions: Knee;Fall Required Braces or Orthoses: Knee Immobilizer - Right Knee Immobilizer - Right: Discontinue once straight leg raise with < 10 degree lag Restrictions Weight Bearing Restrictions: No Other Position/Activity Restrictions: WBAT    Mobility  Bed Mobility Overal bed mobility: Needs Assistance Bed Mobility: Supine to Sit     Supine to sit: Min guard     General bed mobility comments: cues for sequence and use of L LE to self assist  Transfers Overall transfer level: Needs assistance Equipment used: Rolling walker (2 wheeled) Transfers: Sit to/from Stand Sit to Stand: Min guard         General transfer comment: cues for LE management and use of UEs to self assist  Ambulation/Gait Ambulation/Gait assistance: Min guard;Supervision Ambulation Distance (Feet): 135 Feet Assistive device: Rolling walker (2 wheeled) Gait Pattern/deviations: Step-to pattern;Decreased step length - right;Decreased step length - left;Shuffle;Trunk flexed Gait velocity: decr   General Gait Details: cues for sequence, posture and position from RW - ltd by nausea   Stairs Stairs: Yes Stairs assistance: Min assist Stair Management: No rails;Backwards;Step to pattern;With walker Number of Stairs: 1 (twice) General stair comments: cues for sequence and foot/RW placement  Wheelchair Mobility    Modified Rankin (Stroke Patients Only)       Balance                                    Cognition Arousal/Alertness:  Awake/alert Behavior During Therapy: WFL for tasks assessed/performed Overall Cognitive Status: Within Functional Limits for tasks assessed                      Exercises Total Joint Exercises Ankle Circles/Pumps: AROM;Both;15 reps;Supine Quad Sets: AROM;Both;Supine;20 reps Heel Slides: AAROM;Supine;Right;20 reps Straight Leg Raises: Right;Supine;20 reps;AAROM;AROM Goniometric ROM: R knee AAROm -10 - 60    General Comments        Pertinent Vitals/Pain Pain Assessment: 0-10 Pain Score: 4  Pain Location: R knee Pain Descriptors / Indicators: Aching Pain Intervention(s): Limited activity within patient's tolerance;Monitored during session;Premedicated before session;Ice applied    Home Living                      Prior Function            PT Goals (current goals can now be found in the care plan section) Acute Rehab PT Goals Patient Stated Goal: GEt back in the garden by spring PT Goal Formulation: With patient Time For Goal Achievement: 01/26/14 Potential to Achieve Goals: Good Progress towards PT goals: Progressing toward goals    Frequency  7X/week    PT Plan Current plan remains appropriate    Co-evaluation             End of Session   Activity Tolerance: Patient tolerated treatment well Patient left: Other (comment) (bathroom)     Time: 9678-9381 PT Time Calculation (min) (ACUTE ONLY): 33 min  Charges:  $Gait  Training: 8-22 mins $Therapeutic Exercise: 8-22 mins                    G Codes:      Shylie Polo 05-Feb-2014, 9:46 AM

## 2014-01-21 DIAGNOSIS — Z7901 Long term (current) use of anticoagulants: Secondary | ICD-10-CM | POA: Diagnosis not present

## 2014-01-21 DIAGNOSIS — Z471 Aftercare following joint replacement surgery: Secondary | ICD-10-CM | POA: Diagnosis not present

## 2014-01-21 DIAGNOSIS — Z96651 Presence of right artificial knee joint: Secondary | ICD-10-CM | POA: Diagnosis not present

## 2014-01-21 DIAGNOSIS — R2689 Other abnormalities of gait and mobility: Secondary | ICD-10-CM | POA: Diagnosis not present

## 2014-01-21 DIAGNOSIS — N400 Enlarged prostate without lower urinary tract symptoms: Secondary | ICD-10-CM | POA: Diagnosis not present

## 2014-01-21 DIAGNOSIS — K5730 Diverticulosis of large intestine without perforation or abscess without bleeding: Secondary | ICD-10-CM | POA: Diagnosis not present

## 2014-01-23 DIAGNOSIS — Z7901 Long term (current) use of anticoagulants: Secondary | ICD-10-CM | POA: Diagnosis not present

## 2014-01-23 DIAGNOSIS — N400 Enlarged prostate without lower urinary tract symptoms: Secondary | ICD-10-CM | POA: Diagnosis not present

## 2014-01-23 DIAGNOSIS — R2689 Other abnormalities of gait and mobility: Secondary | ICD-10-CM | POA: Diagnosis not present

## 2014-01-23 DIAGNOSIS — Z96651 Presence of right artificial knee joint: Secondary | ICD-10-CM | POA: Diagnosis not present

## 2014-01-23 DIAGNOSIS — K5730 Diverticulosis of large intestine without perforation or abscess without bleeding: Secondary | ICD-10-CM | POA: Diagnosis not present

## 2014-01-23 DIAGNOSIS — Z471 Aftercare following joint replacement surgery: Secondary | ICD-10-CM | POA: Diagnosis not present

## 2014-01-25 DIAGNOSIS — K5730 Diverticulosis of large intestine without perforation or abscess without bleeding: Secondary | ICD-10-CM | POA: Diagnosis not present

## 2014-01-25 DIAGNOSIS — Z7901 Long term (current) use of anticoagulants: Secondary | ICD-10-CM | POA: Diagnosis not present

## 2014-01-25 DIAGNOSIS — N400 Enlarged prostate without lower urinary tract symptoms: Secondary | ICD-10-CM | POA: Diagnosis not present

## 2014-01-25 DIAGNOSIS — R2689 Other abnormalities of gait and mobility: Secondary | ICD-10-CM | POA: Diagnosis not present

## 2014-01-25 DIAGNOSIS — Z96651 Presence of right artificial knee joint: Secondary | ICD-10-CM | POA: Diagnosis not present

## 2014-01-25 DIAGNOSIS — Z471 Aftercare following joint replacement surgery: Secondary | ICD-10-CM | POA: Diagnosis not present

## 2014-01-27 DIAGNOSIS — N400 Enlarged prostate without lower urinary tract symptoms: Secondary | ICD-10-CM | POA: Diagnosis not present

## 2014-01-27 DIAGNOSIS — K5730 Diverticulosis of large intestine without perforation or abscess without bleeding: Secondary | ICD-10-CM | POA: Diagnosis not present

## 2014-01-27 DIAGNOSIS — Z96651 Presence of right artificial knee joint: Secondary | ICD-10-CM | POA: Diagnosis not present

## 2014-01-27 DIAGNOSIS — Z471 Aftercare following joint replacement surgery: Secondary | ICD-10-CM | POA: Diagnosis not present

## 2014-01-27 DIAGNOSIS — Z7901 Long term (current) use of anticoagulants: Secondary | ICD-10-CM | POA: Diagnosis not present

## 2014-01-27 DIAGNOSIS — R2689 Other abnormalities of gait and mobility: Secondary | ICD-10-CM | POA: Diagnosis not present

## 2014-01-30 DIAGNOSIS — R2689 Other abnormalities of gait and mobility: Secondary | ICD-10-CM | POA: Diagnosis not present

## 2014-01-30 DIAGNOSIS — N400 Enlarged prostate without lower urinary tract symptoms: Secondary | ICD-10-CM | POA: Diagnosis not present

## 2014-01-30 DIAGNOSIS — Z7901 Long term (current) use of anticoagulants: Secondary | ICD-10-CM | POA: Diagnosis not present

## 2014-01-30 DIAGNOSIS — K5730 Diverticulosis of large intestine without perforation or abscess without bleeding: Secondary | ICD-10-CM | POA: Diagnosis not present

## 2014-01-30 DIAGNOSIS — Z471 Aftercare following joint replacement surgery: Secondary | ICD-10-CM | POA: Diagnosis not present

## 2014-01-30 DIAGNOSIS — Z96651 Presence of right artificial knee joint: Secondary | ICD-10-CM | POA: Diagnosis not present

## 2014-02-01 DIAGNOSIS — Z96651 Presence of right artificial knee joint: Secondary | ICD-10-CM | POA: Diagnosis not present

## 2014-02-01 DIAGNOSIS — Z7901 Long term (current) use of anticoagulants: Secondary | ICD-10-CM | POA: Diagnosis not present

## 2014-02-01 DIAGNOSIS — Z471 Aftercare following joint replacement surgery: Secondary | ICD-10-CM | POA: Diagnosis not present

## 2014-02-01 DIAGNOSIS — R2689 Other abnormalities of gait and mobility: Secondary | ICD-10-CM | POA: Diagnosis not present

## 2014-02-01 DIAGNOSIS — K5730 Diverticulosis of large intestine without perforation or abscess without bleeding: Secondary | ICD-10-CM | POA: Diagnosis not present

## 2014-02-01 DIAGNOSIS — N400 Enlarged prostate without lower urinary tract symptoms: Secondary | ICD-10-CM | POA: Diagnosis not present

## 2014-02-02 DIAGNOSIS — Z96651 Presence of right artificial knee joint: Secondary | ICD-10-CM | POA: Diagnosis not present

## 2014-02-02 DIAGNOSIS — Z4789 Encounter for other orthopedic aftercare: Secondary | ICD-10-CM | POA: Diagnosis not present

## 2014-02-02 DIAGNOSIS — Z471 Aftercare following joint replacement surgery: Secondary | ICD-10-CM | POA: Diagnosis not present

## 2014-02-03 DIAGNOSIS — R2689 Other abnormalities of gait and mobility: Secondary | ICD-10-CM | POA: Diagnosis not present

## 2014-02-03 DIAGNOSIS — Z471 Aftercare following joint replacement surgery: Secondary | ICD-10-CM | POA: Diagnosis not present

## 2014-02-03 DIAGNOSIS — N400 Enlarged prostate without lower urinary tract symptoms: Secondary | ICD-10-CM | POA: Diagnosis not present

## 2014-02-03 DIAGNOSIS — Z7901 Long term (current) use of anticoagulants: Secondary | ICD-10-CM | POA: Diagnosis not present

## 2014-02-03 DIAGNOSIS — Z96651 Presence of right artificial knee joint: Secondary | ICD-10-CM | POA: Diagnosis not present

## 2014-02-03 DIAGNOSIS — K5730 Diverticulosis of large intestine without perforation or abscess without bleeding: Secondary | ICD-10-CM | POA: Diagnosis not present

## 2014-02-06 ENCOUNTER — Ambulatory Visit: Payer: Medicare Other | Attending: Orthopedic Surgery | Admitting: Rehabilitation

## 2014-02-06 DIAGNOSIS — Z96651 Presence of right artificial knee joint: Secondary | ICD-10-CM | POA: Diagnosis not present

## 2014-02-06 DIAGNOSIS — M25661 Stiffness of right knee, not elsewhere classified: Secondary | ICD-10-CM | POA: Insufficient documentation

## 2014-02-06 DIAGNOSIS — M25561 Pain in right knee: Secondary | ICD-10-CM | POA: Insufficient documentation

## 2014-02-06 DIAGNOSIS — R262 Difficulty in walking, not elsewhere classified: Secondary | ICD-10-CM | POA: Insufficient documentation

## 2014-02-08 ENCOUNTER — Ambulatory Visit: Payer: Medicare Other | Admitting: Rehabilitation

## 2014-02-08 DIAGNOSIS — M25661 Stiffness of right knee, not elsewhere classified: Secondary | ICD-10-CM | POA: Diagnosis not present

## 2014-02-08 DIAGNOSIS — M25561 Pain in right knee: Secondary | ICD-10-CM | POA: Diagnosis not present

## 2014-02-08 DIAGNOSIS — Z96651 Presence of right artificial knee joint: Secondary | ICD-10-CM | POA: Diagnosis not present

## 2014-02-08 DIAGNOSIS — R262 Difficulty in walking, not elsewhere classified: Secondary | ICD-10-CM | POA: Diagnosis not present

## 2014-02-10 ENCOUNTER — Ambulatory Visit: Payer: Medicare Other | Admitting: Rehabilitation

## 2014-02-14 ENCOUNTER — Ambulatory Visit: Payer: Medicare Other | Admitting: Rehabilitation

## 2014-02-14 DIAGNOSIS — M25661 Stiffness of right knee, not elsewhere classified: Secondary | ICD-10-CM | POA: Diagnosis not present

## 2014-02-14 DIAGNOSIS — M25561 Pain in right knee: Secondary | ICD-10-CM | POA: Diagnosis not present

## 2014-02-14 DIAGNOSIS — Z96651 Presence of right artificial knee joint: Secondary | ICD-10-CM | POA: Diagnosis not present

## 2014-02-14 DIAGNOSIS — R262 Difficulty in walking, not elsewhere classified: Secondary | ICD-10-CM | POA: Diagnosis not present

## 2014-02-15 ENCOUNTER — Ambulatory Visit: Payer: Medicare Other | Admitting: Rehabilitation

## 2014-02-15 DIAGNOSIS — Z96651 Presence of right artificial knee joint: Secondary | ICD-10-CM | POA: Diagnosis not present

## 2014-02-15 DIAGNOSIS — R262 Difficulty in walking, not elsewhere classified: Secondary | ICD-10-CM | POA: Diagnosis not present

## 2014-02-15 DIAGNOSIS — M25661 Stiffness of right knee, not elsewhere classified: Secondary | ICD-10-CM | POA: Diagnosis not present

## 2014-02-15 DIAGNOSIS — M25561 Pain in right knee: Secondary | ICD-10-CM | POA: Diagnosis not present

## 2014-02-17 ENCOUNTER — Ambulatory Visit: Payer: Medicare Other | Admitting: Rehabilitation

## 2014-02-17 DIAGNOSIS — M25561 Pain in right knee: Secondary | ICD-10-CM | POA: Diagnosis not present

## 2014-02-17 DIAGNOSIS — M25661 Stiffness of right knee, not elsewhere classified: Secondary | ICD-10-CM | POA: Diagnosis not present

## 2014-02-17 DIAGNOSIS — Z96651 Presence of right artificial knee joint: Secondary | ICD-10-CM | POA: Diagnosis not present

## 2014-02-17 DIAGNOSIS — R262 Difficulty in walking, not elsewhere classified: Secondary | ICD-10-CM | POA: Diagnosis not present

## 2014-02-17 NOTE — Discharge Summary (Signed)
Physician Discharge Summary   Patient ID: Alec Johnson MRN: [id number] DOB/AGE: 66-Feb-1952 77 y.o.  Admit date: 01/18/2014 Discharge date: 01/20/2014  Primary Diagnosis: Osteoarthritis, right knee  Admission Diagnoses:  Past Medical History  Diagnosis Date  . Diverticular disease   . Hyperlipidemia   . Seborrheic keratosis   . Osteoarthritis   . Erectile dysfunction   . Colon polyp   . GERD (gastroesophageal reflux disease)     NO Recent problems   Discharge Diagnoses:   Active Problems:   Total knee replacement status  Estimated body mass index is 33.29 kg/(m^2) as calculated from the following:   Height as of this encounter: $RemoveBeforeD'5\' 10"'CHeMRsWYLLOddV$  (1.778 m).   Weight as of this encounter: 105.235 kg (232 lb).  Procedure:  Procedure(s) (LRB): RIGHT TOTAL KNEE ARTHROPLASTY (Right)   Consults: None  HPI: Alec Johnson, 64 y.o. male, has a history of pain and functional disability in the right knee due to arthritis and has failed non-surgical conservative treatments for greater than 12 weeks to includeNSAID's and/or analgesics, corticosteriod injections, viscosupplementation injections and activity modification. Onset of symptoms was gradual, starting 3 years ago with gradually worsening course since that time. The patient noted prior procedures on the knee to include arthroscopy and menisectomy on the right knee(s). Patient currently rates pain in the right knee(s) at 7 out of 10 with activity. Patient has night pain, worsening of pain with activity and weight bearing, pain that interferes with activities of daily living, pain with passive range of motion, crepitus and joint swelling. Patient has evidence of periarticular osteophytes and joint space narrowing by imaging studies. There is no active infection.  Laboratory Data: Admission on 01/18/2014, Discharged on 01/20/2014  Component Date Value Ref Range Status  . ABO/RH(D) 01/18/2014 A POS   Final  . Antibody Screen 01/18/2014 NEG    Final  . Sample Expiration 01/18/2014 01/21/2014   Final  . ABO/RH(D) 01/18/2014 A POS   Final  . WBC 01/19/2014 13.0* 4.0 - 10.5 K/uL Final  . RBC 01/19/2014 3.96* 4.22 - 5.81 MIL/uL Final  . Hemoglobin 01/19/2014 12.5* 13.0 - 17.0 g/dL Final  . HCT 01/19/2014 38.2* 39.0 - 52.0 % Final  . MCV 01/19/2014 96.5  78.0 - 100.0 fL Final  . MCH 01/19/2014 31.6  26.0 - 34.0 pg Final  . MCHC 01/19/2014 32.7  30.0 - 36.0 g/dL Final  . RDW 01/19/2014 12.3  11.5 - 15.5 % Final  . Platelets 01/19/2014 162  150 - 400 K/uL Final  . Sodium 01/19/2014 139  135 - 145 mmol/L Final   Please note change in reference range.  . Potassium 01/19/2014 4.3  3.5 - 5.1 mmol/L Final   Please note change in reference range.  . Chloride 01/19/2014 102  96 - 112 mEq/L Final  . CO2 01/19/2014 30  19 - 32 mmol/L Final  . Glucose, Bld 01/19/2014 128* 70 - 99 mg/dL Final  . BUN 01/19/2014 13  6 - 23 mg/dL Final  . Creatinine, Ser 01/19/2014 0.84  0.50 - 1.35 mg/dL Final  . Calcium 01/19/2014 8.5  8.4 - 10.5 mg/dL Final  . GFR calc non Af Amer 01/19/2014 90* >90 mL/min Final  . GFR calc Af Amer 01/19/2014 >90  >90 mL/min Final   Comment: (NOTE) The eGFR has been calculated using the CKD EPI equation. This calculation has not been validated in all clinical situations. eGFR's persistently <90 mL/min signify possible Chronic Kidney Disease.   Georgiann Hahn gap 01/19/2014  7  5 - 15 Final  . WBC 01/20/2014 14.4* 4.0 - 10.5 K/uL Final  . RBC 01/20/2014 3.53* 4.22 - 5.81 MIL/uL Final  . Hemoglobin 01/20/2014 11.1* 13.0 - 17.0 g/dL Final  . HCT 46/19/0122 34.6* 39.0 - 52.0 % Final  . MCV 01/20/2014 98.0  78.0 - 100.0 fL Final  . MCH 01/20/2014 31.4  26.0 - 34.0 pg Final  . MCHC 01/20/2014 32.1  30.0 - 36.0 g/dL Final  . RDW 24/11/4641 12.8  11.5 - 15.5 % Final  . Platelets 01/20/2014 155  150 - 400 K/uL Final  . Sodium 01/20/2014 138  135 - 145 mmol/L Final   Please note change in reference range.  . Potassium 01/20/2014 3.8   3.5 - 5.1 mmol/L Final   Please note change in reference range.  . Chloride 01/20/2014 100  96 - 112 mEq/L Final  . CO2 01/20/2014 30  19 - 32 mmol/L Final  . Glucose, Bld 01/20/2014 145* 70 - 99 mg/dL Final  . BUN 14/27/6701 16  6 - 23 mg/dL Final  . Creatinine, Ser 01/20/2014 0.83  0.50 - 1.35 mg/dL Final  . Calcium 10/23/4959 8.5  8.4 - 10.5 mg/dL Final  . GFR calc non Af Amer 01/20/2014 >90  >90 mL/min Final  . GFR calc Af Amer 01/20/2014 >90  >90 mL/min Final   Comment: (NOTE) The eGFR has been calculated using the CKD EPI equation. This calculation has not been validated in all clinical situations. eGFR's persistently <90 mL/min signify possible Chronic Kidney Disease.   . Anion gap 01/20/2014 8  5 - 15 Final  . Color, Urine 01/19/2014 YELLOW  YELLOW Final  . APPearance 01/19/2014 CLEAR  CLEAR Final  . Specific Gravity, Urine 01/19/2014 1.012  1.005 - 1.030 Final  . pH 01/19/2014 5.5  5.0 - 8.0 Final  . Glucose, UA 01/19/2014 NEGATIVE  NEGATIVE mg/dL Final  . Hgb urine dipstick 01/19/2014 TRACE* NEGATIVE Final  . Bilirubin Urine 01/19/2014 NEGATIVE  NEGATIVE Final  . Ketones, ur 01/19/2014 NEGATIVE  NEGATIVE mg/dL Final  . Protein, ur 16/43/5391 NEGATIVE  NEGATIVE mg/dL Final  . Urobilinogen, UA 01/19/2014 0.2  0.0 - 1.0 mg/dL Final  . Nitrite 22/58/3462 NEGATIVE  NEGATIVE Final  . Leukocytes, UA 01/19/2014 NEGATIVE  NEGATIVE Final  . Squamous Epithelial / LPF 01/19/2014 RARE  RARE Final  . WBC, UA 01/19/2014 0-2  <3 WBC/hpf Final  . RBC / HPF 01/19/2014 0-2  <3 RBC/hpf Final  . Bacteria, UA 01/19/2014 RARE  RARE Final  Hospital Outpatient Visit on 01/10/2014  Component Date Value Ref Range Status  . MRSA, PCR 01/10/2014 NEGATIVE  NEGATIVE Final  . Staphylococcus aureus 01/10/2014 NEGATIVE  NEGATIVE Final   Comment:        The Xpert SA Assay (FDA approved for NASAL specimens in patients over 72 years of age), is one component of a comprehensive surveillance program.   Test performance has been validated by [employer] for patients greater than or equal to 4 year old. It is not intended to diagnose infection nor to guide or monitor treatment.   Marland Kitchen aPTT 01/10/2014 28  24 - 37 seconds Final  . Prothrombin Time 01/10/2014 13.4  11.6 - 15.2 seconds Final  . INR 01/10/2014 1.01  0.00 - 1.49 Final  . Color, Urine 01/10/2014 YELLOW  YELLOW Final  . APPearance 01/10/2014 CLEAR  CLEAR Final  . Specific Gravity, Urine 01/10/2014 1.026  1.005 - 1.030 Final  . pH 01/10/2014 7.0  5.0 - 8.0 Final  . Glucose, UA 01/10/2014 NEGATIVE  NEGATIVE mg/dL Final  . Hgb urine dipstick 01/10/2014 NEGATIVE  NEGATIVE Final  . Bilirubin Urine 01/10/2014 NEGATIVE  NEGATIVE Final  . Ketones, ur 01/10/2014 NEGATIVE  NEGATIVE mg/dL Final  . Protein, ur 01/10/2014 NEGATIVE  NEGATIVE mg/dL Final  . Urobilinogen, UA 01/10/2014 1.0  0.0 - 1.0 mg/dL Final  . Nitrite 01/10/2014 NEGATIVE  NEGATIVE Final  . Leukocytes, UA 01/10/2014 NEGATIVE  NEGATIVE Final   MICROSCOPIC NOT DONE ON URINES WITH NEGATIVE PROTEIN, BLOOD, LEUKOCYTES, NITRITE, OR GLUCOSE <1000 mg/dL.     X-Rays:No results found.  EKG: Orders placed or performed in visit on 11/17/12  . EKG 12-Lead     Hospital Course: Alec Johnson is a 64 y.o. who was admitted to [facility]. They were brought to the operating room on 01/18/2014 and underwent Procedure(s): RIGHT TOTAL KNEE ARTHROPLASTY.  Patient tolerated the procedure well and was later transferred to the recovery room and then to the orthopaedic floor for postoperative care.  They were given PO and IV analgesics for pain control following their surgery.  They were given 24 hours of postoperative antibiotics of  Anti-infectives    Start     Dose/Rate Route Frequency Ordered Stop   01/19/14 0000  vancomycin (VANCOCIN) IVPB 1000 mg/200 mL premix     1,000 mg200 mL/hr over 60 Minutes Intravenous Every 12 hours 01/18/14 1646 01/19/14 0008   01/18/14 1300   polymyxin B 500,000 Units, bacitracin 50,000 Units in sodium chloride irrigation 0.9 % 500 mL irrigation  Status:  Discontinued       As needed 01/18/14 1300 01/18/14 1449   01/18/14 0600  vancomycin (VANCOCIN) 1,500 mg in sodium chloride 0.9 % 500 mL IVPB     1,500 mg250 mL/hr over 120 Minutes Intravenous On call to O.R. 01/17/14 1403 01/18/14 1335     and started on DVT prophylaxis in the form of Xarelto.   PT and OT were ordered for total joint protocol.  Discharge planning consulted to help with postop disposition and equipment needs.  Patient had a decent night on the evening of surgery.  They started to get up OOB with therapy on day one. Hemovac drain was pulled without difficulty.  Continued to work with therapy into day two.  Patient was seen in rounds and was ready to go home.   Diet: Regular diet Activity:WBAT Follow-up:in 2 weeks Disposition - Home Discharged Condition: stable   Discharge Instructions    Call MD / Call 911    Complete by:  As directed   If you experience chest pain or shortness of breath, CALL 911 and be transported to the hospital emergency room.  If you develope a fever above 101 F, pus (white drainage) or increased drainage or redness at the wound, or calf pain, call your surgeon's office.     Constipation Prevention    Complete by:  As directed   Drink plenty of fluids.  Prune juice may be helpful.  You may use a stool softener, such as Colace (over the counter) 100 mg twice a day.  Use MiraLax (over the counter) for constipation as needed.     Diet general    Complete by:  As directed      Discharge instructions    Complete by:  As directed   Walk with your walker. Weightbearing as tolerated [place] will follow you at home for your therapy. Do not change your dressing  unless it appears compromised. Shower only, no tub bath. Call if any temperatures greater than 101 or any wound complications: [phone] during the day and ask for Dr. Charlestine Night  nurse, Brunilda Payor.     Discharge patient    Complete by:  As directed      Do not put a pillow under the knee. Place it under the heel.    Complete by:  As directed      Driving restrictions    Complete by:  As directed   No driving     Increase activity slowly as tolerated    Complete by:  As directed             Medication List    STOP taking these medications        aspirin 81 MG tablet     glucosamine-chondroitin 500-400 MG tablet     meloxicam 15 MG tablet  Commonly known as:  MOBIC     Vitamin D3 5000 UNITS Tabs      TAKE these medications        acetaminophen 500 MG tablet  Commonly known as:  TYLENOL  Take 1,000 mg by mouth every 6 (six) hours as needed for mild pain or moderate pain.     finasteride 5 MG tablet  Commonly known as:  PROSCAR  Take 5 mg by mouth every morning.     fluticasone 50 MCG/ACT nasal spray  Commonly known as:  FLONASE  Place 2 sprays into both nostrils daily.     HYDROmorphone 2 MG tablet  Commonly known as:  DILAUDID  Take 1-2 tablets (2-4 mg total) by mouth every 4 (four) hours as needed for severe pain.     methocarbamol 500 MG tablet  Commonly known as:  ROBAXIN  Take 1 tablet (500 mg total) by mouth every 6 (six) hours as needed for muscle spasms.     rivaroxaban 10 MG Tabs tablet  Commonly known as:  XARELTO  Take 1 tablet (10 mg total) by mouth daily with breakfast.     simvastatin 80 MG tablet  Commonly known as:  ZOCOR  Take 1/2 - 1 tab po QHS for cholesterol           Follow-up Information    Follow up with GIOFFRE,RONALD A, MD. Schedule an appointment as soon as possible for a visit in 2 weeks.   Specialty:  Orthopedic Surgery   Contact information:   [address] [place] [address] [phone]       Follow up with [facility].   Why:  home health physical therapy   Contact information:   [address] [address] [phone]        Signed: Ardeen Jourdain, PA-C Orthopaedic Surgery 02/17/2014, 7:35 AM

## 2014-02-20 ENCOUNTER — Ambulatory Visit: Payer: Medicare Other | Attending: Orthopedic Surgery | Admitting: Rehabilitation

## 2014-02-20 DIAGNOSIS — R262 Difficulty in walking, not elsewhere classified: Secondary | ICD-10-CM | POA: Diagnosis not present

## 2014-02-20 DIAGNOSIS — M25661 Stiffness of right knee, not elsewhere classified: Secondary | ICD-10-CM | POA: Insufficient documentation

## 2014-02-20 DIAGNOSIS — Z96651 Presence of right artificial knee joint: Secondary | ICD-10-CM | POA: Diagnosis not present

## 2014-02-20 DIAGNOSIS — N401 Enlarged prostate with lower urinary tract symptoms: Secondary | ICD-10-CM | POA: Diagnosis not present

## 2014-02-20 DIAGNOSIS — M25561 Pain in right knee: Secondary | ICD-10-CM | POA: Insufficient documentation

## 2014-02-20 DIAGNOSIS — R351 Nocturia: Secondary | ICD-10-CM | POA: Diagnosis not present

## 2014-02-20 DIAGNOSIS — E291 Testicular hypofunction: Secondary | ICD-10-CM | POA: Diagnosis not present

## 2014-02-22 ENCOUNTER — Ambulatory Visit: Payer: Medicare Other | Admitting: Rehabilitation

## 2014-02-23 ENCOUNTER — Ambulatory Visit: Payer: Medicare Other | Admitting: Physical Therapy

## 2014-02-23 DIAGNOSIS — M25561 Pain in right knee: Secondary | ICD-10-CM | POA: Diagnosis not present

## 2014-02-23 DIAGNOSIS — R262 Difficulty in walking, not elsewhere classified: Secondary | ICD-10-CM | POA: Diagnosis not present

## 2014-02-23 DIAGNOSIS — Z471 Aftercare following joint replacement surgery: Secondary | ICD-10-CM | POA: Diagnosis not present

## 2014-02-23 DIAGNOSIS — M25661 Stiffness of right knee, not elsewhere classified: Secondary | ICD-10-CM | POA: Diagnosis not present

## 2014-02-23 DIAGNOSIS — Z96651 Presence of right artificial knee joint: Secondary | ICD-10-CM | POA: Diagnosis not present

## 2014-02-27 ENCOUNTER — Ambulatory Visit: Payer: Medicare Other | Admitting: Rehabilitation

## 2014-02-27 DIAGNOSIS — M25561 Pain in right knee: Secondary | ICD-10-CM | POA: Diagnosis not present

## 2014-02-27 DIAGNOSIS — R262 Difficulty in walking, not elsewhere classified: Secondary | ICD-10-CM | POA: Diagnosis not present

## 2014-02-27 DIAGNOSIS — Z96651 Presence of right artificial knee joint: Secondary | ICD-10-CM | POA: Diagnosis not present

## 2014-02-27 DIAGNOSIS — M25661 Stiffness of right knee, not elsewhere classified: Secondary | ICD-10-CM | POA: Diagnosis not present

## 2014-03-01 ENCOUNTER — Ambulatory Visit: Payer: Medicare Other | Admitting: Rehabilitation

## 2014-03-02 ENCOUNTER — Ambulatory Visit: Payer: Medicare Other | Admitting: Rehabilitation

## 2014-03-02 DIAGNOSIS — M25661 Stiffness of right knee, not elsewhere classified: Secondary | ICD-10-CM | POA: Diagnosis not present

## 2014-03-02 DIAGNOSIS — Z96651 Presence of right artificial knee joint: Secondary | ICD-10-CM | POA: Diagnosis not present

## 2014-03-02 DIAGNOSIS — M25561 Pain in right knee: Secondary | ICD-10-CM | POA: Diagnosis not present

## 2014-03-02 DIAGNOSIS — R262 Difficulty in walking, not elsewhere classified: Secondary | ICD-10-CM | POA: Diagnosis not present

## 2014-03-03 ENCOUNTER — Ambulatory Visit: Payer: Medicare Other | Admitting: Physical Therapy

## 2014-03-03 DIAGNOSIS — M25661 Stiffness of right knee, not elsewhere classified: Secondary | ICD-10-CM | POA: Diagnosis not present

## 2014-03-03 DIAGNOSIS — M25561 Pain in right knee: Secondary | ICD-10-CM | POA: Diagnosis not present

## 2014-03-03 DIAGNOSIS — Z96651 Presence of right artificial knee joint: Secondary | ICD-10-CM | POA: Diagnosis not present

## 2014-03-03 DIAGNOSIS — R262 Difficulty in walking, not elsewhere classified: Secondary | ICD-10-CM | POA: Diagnosis not present

## 2014-03-06 ENCOUNTER — Ambulatory Visit: Payer: Medicare Other | Admitting: Rehabilitation

## 2014-03-06 DIAGNOSIS — M25661 Stiffness of right knee, not elsewhere classified: Secondary | ICD-10-CM | POA: Diagnosis not present

## 2014-03-06 DIAGNOSIS — Z96651 Presence of right artificial knee joint: Secondary | ICD-10-CM | POA: Diagnosis not present

## 2014-03-06 DIAGNOSIS — R262 Difficulty in walking, not elsewhere classified: Secondary | ICD-10-CM | POA: Diagnosis not present

## 2014-03-06 DIAGNOSIS — M25561 Pain in right knee: Secondary | ICD-10-CM | POA: Diagnosis not present

## 2014-03-08 ENCOUNTER — Encounter: Payer: Self-pay | Admitting: Rehabilitation

## 2014-03-08 ENCOUNTER — Ambulatory Visit: Payer: Medicare Other | Admitting: Rehabilitation

## 2014-03-08 DIAGNOSIS — M25561 Pain in right knee: Secondary | ICD-10-CM

## 2014-03-08 DIAGNOSIS — M25661 Stiffness of right knee, not elsewhere classified: Secondary | ICD-10-CM

## 2014-03-08 DIAGNOSIS — R262 Difficulty in walking, not elsewhere classified: Secondary | ICD-10-CM

## 2014-03-08 DIAGNOSIS — Z96651 Presence of right artificial knee joint: Secondary | ICD-10-CM | POA: Diagnosis not present

## 2014-03-08 NOTE — Therapy (Signed)
[facility] [address]  [place], [place], [address] Phone: [phone]   Fax:  [phone]  Physical Therapy Treatment  Patient Details  Name: Alec Johnson MRN: [id number] Date of Birth: 1950/06/20 Referring Provider:  Tobi Bastos, MD  Encounter Date: 03/08/2014      PT End of Session - 03/08/14 1021    Visit Number 13   Number of Visits 18   Date for PT Re-Evaluation 03/20/14   PT Start Time 0930   PT Stop Time 2035   PT Time Calculation (min) 45 min   Activity Tolerance Patient tolerated treatment well      Past Medical History  Diagnosis Date  . Diverticular disease   . Hyperlipidemia   . Seborrheic keratosis   . Osteoarthritis   . Erectile dysfunction   . Colon polyp   . GERD (gastroesophageal reflux disease)     NO Recent problems    Past Surgical History  Procedure Laterality Date  . Leg surgery  1966    PINNING DUE TO INJURY  . Athroscopic knee surgery      BIL KNEES  . Nose surgery    . Total knee arthroplasty Right 01/18/2014    Procedure: RIGHT TOTAL KNEE ARTHROPLASTY;  Surgeon: Tobi Bastos, MD;  Location: [facility];  Service: Orthopedics;  Laterality: Right;    There were no vitals taken for this visit.  Visit Diagnosis:  Trouble walking  Knee pain, acute, right  Knee stiffness, right      Subjective Assessment - 03/08/14 0933    Symptoms Maybe feeling a bit better today.  But still stiff   Currently in Pain? Other (Comment)  pain only with use   Aggravating Factors  use   Pain Relieving Factors rest, ice, medication          [facility] PT Assessment - 03/08/14 0001    PROM   Overall PROM  Other (comment)  R knee to 96                  [facility] Adult PT Treatment/Exercise - 03/08/14 0001    High Level Balance   High Level Balance Activities Other (comment)  SL stance tramp throws 2x10 green ball on and off foam CGA   Exercises   Exercises Knee/Hip   Knee/Hip Exercises: Aerobic   Stationary Bike 50min forward/backward   Knee/Hip Exercises: Standing   Other Standing Knee Exercises TRX squats 2x10 ROM focus   Manual Therapy   Manual Therapy Passive ROM   Passive ROM R knee flexion to tolerance in supine and CR in prone x 3 with quad stretch   Knee/Hip Exercises: Machines for Strengthening   Cybex Knee Extension R 15#; 3x10, 2second hold   Cybex Knee Flexion R 10# 3x10 focus on ROM   Cybex Leg Press R 20# slow for ROM work; 2x15                PT Education - 03/08/14 0957    Education provided Yes   Education Details Gym appropriate activities if patient decides on no manipulation   Person(s) Educated Patient   Methods Explanation   Comprehension Verbalized understanding;Returned demonstration             PT Long Term Goals - 03/08/14 0942    PT LONG TERM GOAL #1   Title be Independent with HEP (2/29)   Time 6   Period Weeks   Status On-going  PT LONG TERM GOAL #2   Title improve AROM to 5-110 or greater 2/29   Time 6   Period Weeks   Status On-going   PT LONG TERM GOAL #3   Title return to ambulation without AD with correct gait biomechanics   2/29   Time 6   Period Weeks   Status On-going   PT LONG TERM GOAL #4   Title return to driving without limitations   Time 6   Period Weeks   Status Achieved               Plan - 03/08/14 1024    Clinical Impression Statement patient will be deciding on Friday if he will have a manipulation of the knee or D/C with HEP at the gym.  one more visit of PT tomorrow   PT Next Visit Plan MD note        Problem List Patient Active Problem List   Diagnosis Date Noted  . 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 Rash and nonspecific skin eruption 04/30/2013  . Cellulitis of finger of left hand 04/30/2013  . Routine general medical examination at a health care facility 01/22/2013  . BPH (benign prostatic hypertrophy) 11/28/2012  . Need for  prophylactic vaccination and inoculation against influenza 11/28/2012  . Bradycardia 10/01/2011  . Tobacco abuse 10/01/2011  . Diverticular disease   . Neck pain   . Hyperlipidemia   . Allergic rhinitis   . Allergic sinusitis   . GERD (gastroesophageal reflux disease)   . Seborrheic keratosis   . Osteoarthritis   . Erectile dysfunction   . Colon polyp     Stark Bray 03/08/2014, 10:26 AM  [facility] [address]  [place], [place], [address] Phone: [phone]   Fax:  [phone]

## 2014-03-09 ENCOUNTER — Ambulatory Visit: Payer: Medicare Other | Admitting: Rehabilitation

## 2014-03-09 DIAGNOSIS — R262 Difficulty in walking, not elsewhere classified: Secondary | ICD-10-CM | POA: Diagnosis not present

## 2014-03-09 DIAGNOSIS — Z96651 Presence of right artificial knee joint: Secondary | ICD-10-CM | POA: Diagnosis not present

## 2014-03-09 DIAGNOSIS — M25661 Stiffness of right knee, not elsewhere classified: Secondary | ICD-10-CM

## 2014-03-09 DIAGNOSIS — M25561 Pain in right knee: Secondary | ICD-10-CM | POA: Diagnosis not present

## 2014-03-09 NOTE — Therapy (Signed)
[facility] [address]  [place], [place], [address] Phone: [phone]   Fax:  [phone]  Physical Therapy Treatment  Patient Details  Name: Alec Johnson MRN: [id number] Date of Birth: 1950-07-15 Referring Provider:  Tobi Bastos, MD  Encounter Date: 03/09/2014      PT End of Session - 03/09/14 1143    Visit Number 14   Number of Visits 18   PT Start Time 9983   PT Stop Time 1143   PT Time Calculation (min) 50 min      Past Medical History  Diagnosis Date  . Diverticular disease   . Hyperlipidemia   . Seborrheic keratosis   . Osteoarthritis   . Erectile dysfunction   . Colon polyp   . GERD (gastroesophageal reflux disease)     NO Recent problems    Past Surgical History  Procedure Laterality Date  . Leg surgery  1966    PINNING DUE TO INJURY  . Athroscopic knee surgery      BIL KNEES  . Nose surgery    . Total knee arthroplasty Right 01/18/2014    Procedure: RIGHT TOTAL KNEE ARTHROPLASTY;  Surgeon: Tobi Bastos, MD;  Location: [facility];  Service: Orthopedics;  Laterality: Right;    There were no vitals taken for this visit.  Visit Diagnosis:  Trouble walking  Knee pain, acute, right  Knee stiffness, right      Subjective Assessment - 03/09/14 1056    Symptoms Reports he had a bad night due to pain. Started yesterday afternoon after therapy and needed to take pain medication.    Currently in Pain? Yes   Pain Score 4    Pain Location Knee   Pain Orientation Right   Pain Descriptors / Indicators Aching;Throbbing          [facility] PT Assessment - 03/09/14 0001    AROM   Right Knee Extension 9   Right Knee Flexion 95   PROM   Overall PROM  Other (comment)  5-96                  [facility] Adult PT Treatment/Exercise - 03/09/14 0001    Knee/Hip Exercises: Stretches   Hip Flexor Stretch 2 reps;20 seconds;Other (comment)  Mod Thomas   Knee/Hip Exercises: Aerobic   Stationary Bike 65min forward/backward/full revolutions   Knee/Hip Exercises: Machines for Strengthening   Cybex Knee Extension R 15#; 2x15, 2second hold   Cybex Leg Press R 20# slow for ROM work; 2x15   Knee/Hip Exercises: Standing   Forward Lunges 10 reps;Right;Other (comment)  On low row seat with 3 second hold   Terminal Knee Extension 10 reps;Other (comment)  ball on wall 5 second hold   Other Standing Knee Exercises TRX squats 2x10 ROM focus   Manual Therapy   Manual Therapy Passive ROM;Massage   Massage STM to Rt ITB in hooklying   Passive ROM Rt knee to tolerance in supine/prone and CR in prone x3 with quad stretch                PT Education - 03/08/14 0957    Education provided Yes   Education Details Gym appropriate activities if patient decides on no manipulation   Person(s) Educated Patient   Methods Explanation   Comprehension Verbalized understanding;Returned demonstration             PT Long Term Goals - 03/08/14 0942    PT LONG  TERM GOAL #1   Title be Independent with HEP (2/29)   Time 6   Period Weeks   Status On-going   PT LONG TERM GOAL #2   Title improve AROM to 5-110 or greater 2/29   Time 6   Period Weeks   Status On-going   PT LONG TERM GOAL #3   Title return to ambulation without AD with correct gait biomechanics   2/29   Time 6   Period Weeks   Status On-going   PT LONG TERM GOAL #4   Title return to driving without limitations   Time 6   Period Weeks   Status Achieved               Plan - 03/09/14 1144    Clinical Impression Statement Patient continue to push ROM with slowing progress. ROM seems to stalled at 96 degrees flexion and is limited due to pain. Continues to tolerate strengthening activities and push himself at home.   Pt will benefit from skilled therapeutic intervention in order to improve on the following deficits Decreased range of motion;Decreased strength;Decreased mobility   Rehab Potential Good   PT  Next Visit Plan See what MD says/recommends        Problem List Patient Active Problem List   Diagnosis Date Noted  . 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 Rash and nonspecific skin eruption 04/30/2013  . Cellulitis of finger of left hand 04/30/2013  . Routine general medical examination at a health care facility 01/22/2013  . BPH (benign prostatic hypertrophy) 11/28/2012  . Need for prophylactic vaccination and inoculation against influenza 11/28/2012  . Bradycardia 10/01/2011  . Tobacco abuse 10/01/2011  . Diverticular disease   . Neck pain   . Hyperlipidemia   . Allergic rhinitis   . Allergic sinusitis   . GERD (gastroesophageal reflux disease)   . Seborrheic keratosis   . Osteoarthritis   . Erectile dysfunction   . Colon polyp     Barbette Hair  PTA 03/09/2014, 11:47 AM  [facility] [address]  [place], [place], [address] Phone: [phone]   Fax:  [phone]

## 2014-03-10 DIAGNOSIS — Z471 Aftercare following joint replacement surgery: Secondary | ICD-10-CM | POA: Diagnosis not present

## 2014-03-10 DIAGNOSIS — M1711 Unilateral primary osteoarthritis, right knee: Secondary | ICD-10-CM | POA: Diagnosis not present

## 2014-03-10 DIAGNOSIS — Z96651 Presence of right artificial knee joint: Secondary | ICD-10-CM | POA: Diagnosis not present

## 2014-03-14 DIAGNOSIS — Z808 Family history of malignant neoplasm of other organs or systems: Secondary | ICD-10-CM | POA: Diagnosis not present

## 2014-03-14 DIAGNOSIS — D485 Neoplasm of uncertain behavior of skin: Secondary | ICD-10-CM | POA: Diagnosis not present

## 2014-03-14 DIAGNOSIS — L821 Other seborrheic keratosis: Secondary | ICD-10-CM | POA: Diagnosis not present

## 2014-03-14 DIAGNOSIS — L578 Other skin changes due to chronic exposure to nonionizing radiation: Secondary | ICD-10-CM | POA: Diagnosis not present

## 2014-03-14 DIAGNOSIS — L820 Inflamed seborrheic keratosis: Secondary | ICD-10-CM | POA: Diagnosis not present

## 2014-03-15 ENCOUNTER — Ambulatory Visit: Payer: Medicare Other | Admitting: Physical Therapy

## 2014-03-15 DIAGNOSIS — M25661 Stiffness of right knee, not elsewhere classified: Secondary | ICD-10-CM

## 2014-03-15 DIAGNOSIS — R262 Difficulty in walking, not elsewhere classified: Secondary | ICD-10-CM

## 2014-03-15 DIAGNOSIS — M25561 Pain in right knee: Secondary | ICD-10-CM

## 2014-03-15 DIAGNOSIS — Z96651 Presence of right artificial knee joint: Secondary | ICD-10-CM | POA: Diagnosis not present

## 2014-03-15 NOTE — Therapy (Signed)
[facility] [address]  [place], [place], [address] Phone: [phone]   Fax:  [phone]  Physical Therapy Treatment/Recertification  Patient Details  Name: Alec Johnson MRN: [id number] Date of Birth: 1950-05-12 Referring Provider:  Tobi Bastos, MD  Encounter Date: 03/15/2014      PT End of Session - 03/15/14 1448    Visit Number 15   Number of Visits 26   Date for PT Re-Evaluation 04/12/14   PT Start Time 1315   PT Stop Time 1358   PT Time Calculation (min) 43 min   Activity Tolerance Patient tolerated treatment well;Patient limited by pain   Behavior During Therapy [facility] for tasks assessed/performed      Past Medical History  Diagnosis Date  . Diverticular disease   . Hyperlipidemia   . Seborrheic keratosis   . Osteoarthritis   . Erectile dysfunction   . Colon polyp   . GERD (gastroesophageal reflux disease)     NO Recent problems    Past Surgical History  Procedure Laterality Date  . Leg surgery  1966    PINNING DUE TO INJURY  . Athroscopic knee surgery      BIL KNEES  . Nose surgery    . Total knee arthroplasty Right 01/18/2014    Procedure: RIGHT TOTAL KNEE ARTHROPLASTY;  Surgeon: Tobi Bastos, MD;  Location: [facility];  Service: Orthopedics;  Laterality: Right;    There were no vitals taken for this visit.  Visit Diagnosis:  Trouble walking - Plan: PT plan of care cert/re-cert  Knee pain, acute, right - Plan: PT plan of care cert/re-cert  Knee stiffness, right - Plan: PT plan of care cert/re-cert      Subjective Assessment - 03/15/14 1315    Symptoms Knee feels fine; still pain with bending   Currently in Pain? Yes   Pain Score 1   up to 8/10 with bending   Pain Location Knee   Pain Orientation Right   Pain Descriptors / Indicators Aching;Throbbing   Aggravating Factors  flexion   Pain Relieving Factors rest, ice, medication          [facility] PT Assessment - 03/15/14  1359    PROM   PROM Assessment Site Knee   Right/Left Knee Right  4-103                  [facility] Adult PT Treatment/Exercise - 03/15/14 1317    Knee/Hip Exercises: Aerobic   Stationary Bike 34min forward/backward/full revolutions   Knee/Hip Exercises: Standing   Lateral Step Up Right;15 reps;Step Height: 6";Hand Hold: 1   Forward Step Up 15 reps;Right;Hand Hold: 1;Step Height: 6"   Step Down Right;15 reps;Hand Hold: 2;Step Height: 6"   Manual Therapy   Manual Therapy Edema management;Joint mobilization;Passive ROM   Edema Management R knee retrograde massage for fluid management   Joint Mobilization patella mobs and femur A/P grades 2-3 for extension   Passive ROM supine 90 degree hip flex; R knee flexion                PT Education - 03/15/14 1400    Education provided Yes   Education Details wall slides to assist with flexion   Person(s) Educated Patient   Methods Explanation   Comprehension Verbalized understanding             PT Long Term Goals - 03/15/14 1449    PT LONG TERM GOAL #1  Title be Independent with HEP (04/12/14)   Status On-going   PT LONG TERM GOAL #2   Title improve AROM to 5-110 or greater (04/12/14)   Status On-going   PT LONG TERM GOAL #3   Title return to ambulation without AD with correct gait biomechanics   (04/12/14)   Status On-going   PT LONG TERM GOAL #4   Title return to driving without limitations   Status Achieved               Plan - 03/15/14 1448    Clinical Impression Statement PROM flexion improved to 103 degrees.  ROM progressing; will continue to benefit from PT to maximize function.   PT Next Visit Plan continue aggressive ROM, manual for edema management   Consulted and Agree with Plan of Care Patient        Problem List Patient Active Problem List   Diagnosis Date Noted  . 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 Rash and nonspecific skin eruption 04/30/2013  . Cellulitis of finger of  left hand 04/30/2013  . Routine general medical examination at a health care facility 01/22/2013  . BPH (benign prostatic hypertrophy) 11/28/2012  . Need for prophylactic vaccination and inoculation against influenza 11/28/2012  . Bradycardia 10/01/2011  . Tobacco abuse 10/01/2011  . Diverticular disease   . Neck pain   . Hyperlipidemia   . Allergic rhinitis   . Allergic sinusitis   . GERD (gastroesophageal reflux disease)   . Seborrheic keratosis   . Osteoarthritis   . Erectile dysfunction   . Colon polyp     Siri Cole 03/15/2014, 2:52 PM  [facility] [address]  [place], [place], [address] Phone: [phone]   Fax:  [phone]

## 2014-03-16 ENCOUNTER — Encounter: Payer: Self-pay | Admitting: Physical Therapy

## 2014-03-16 ENCOUNTER — Ambulatory Visit: Payer: Medicare Other | Admitting: Physical Therapy

## 2014-03-16 DIAGNOSIS — M25661 Stiffness of right knee, not elsewhere classified: Secondary | ICD-10-CM | POA: Diagnosis not present

## 2014-03-16 DIAGNOSIS — R262 Difficulty in walking, not elsewhere classified: Secondary | ICD-10-CM

## 2014-03-16 DIAGNOSIS — Z96651 Presence of right artificial knee joint: Secondary | ICD-10-CM | POA: Diagnosis not present

## 2014-03-16 DIAGNOSIS — M25561 Pain in right knee: Secondary | ICD-10-CM

## 2014-03-16 NOTE — Therapy (Signed)
[facility] [address]  [place], [place], [address] Phone: [phone]   Fax:  [phone]  Physical Therapy Treatment  Patient Details  Name: Alec Johnson MRN: [id number] Date of Birth: Jan 10, 1951 Referring Provider:  Tobi Bastos, MD  Encounter Date: 03/16/2014      PT End of Session - 03/16/14 1542    Visit Number 16   Number of Visits 26   Date for PT Re-Evaluation 04/12/14   PT Start Time 1500   PT Stop Time 1545   PT Time Calculation (min) 45 min      Past Medical History  Diagnosis Date  . Diverticular disease   . Hyperlipidemia   . Seborrheic keratosis   . Osteoarthritis   . Erectile dysfunction   . Colon polyp   . GERD (gastroesophageal reflux disease)     NO Recent problems    Past Surgical History  Procedure Laterality Date  . Leg surgery  1966    PINNING DUE TO INJURY  . Athroscopic knee surgery      BIL KNEES  . Nose surgery    . Total knee arthroplasty Right 01/18/2014    Procedure: RIGHT TOTAL KNEE ARTHROPLASTY;  Surgeon: Tobi Bastos, MD;  Location: [facility];  Service: Orthopedics;  Laterality: Right;    There were no vitals taken for this visit.  Visit Diagnosis:  Knee stiffness, right  Knee pain, acute, right  Trouble walking      Subjective Assessment - 03/16/14 1523    Symptoms states was very sore after last treatment and difficulty sleeping stating only able to sleep 30-40 minutes at a time. Today some soreness but states feels better.  Is going to gym 3x/wk and is performing HEP regularly.   Currently in Pain? Yes   Pain Score --  1-2/10 R Knee pain, mostly anterior aspect of knee   Multiple Pain Sites No          [facility] PT Assessment - 03/15/14 1359    PROM   PROM Assessment Site Knee   Right/Left Knee Right  4-103                  [facility] Adult PT Treatment/Exercise - 03/16/14 1526    Knee/Hip Exercises: Aerobic   Stationary Bike 5 min  w/u   Knee/Hip Exercises: Machines for Strengthening   Total Gym Leg Press 35# DL Press 20x with TKE of R Knee into PT's knee at bottom of motion   Knee/Hip Exercises: Standing   Forward Lunges 5 reps  each leg with TRX   Terminal Knee Extension Right;15 reps   Lateral Step Up Right;20 reps;Step Height: 8";Hand Hold: 1  Side Step-up   Step Down Right;Step Height: 4";Hand Hold: 1;15 reps  stepover not stepdown   Functional Squat 2 sets;10 reps  one set DL, one set SL   Functional Squat Limitations SL limited to ~60 degrees, DL to 90dg   Manual Therapy   Manual Therapy Joint mobilization;Massage   Joint Mobilization R Knee Extension mobes Grade 4 2', Progressive Grade 3 Flexion mobes 5'   Massage scar massage to distal scar (still very hypomobile)                PT Education - 03/15/14 1400    Education provided Yes   Education Details wall slides to assist with flexion   Person(s) Educated Patient   Methods Explanation   Comprehension Verbalized understanding  PT Long Term Goals - 03/15/14 1449    PT LONG TERM GOAL #1   Title be Independent with HEP (04/12/14)   Status On-going   PT LONG TERM GOAL #2   Title improve AROM to 5-110 or greater (04/12/14)   Status On-going   PT LONG TERM GOAL #3   Title return to ambulation without AD with correct gait biomechanics   (04/12/14)   Status On-going   PT LONG TERM GOAL #4   Title return to driving without limitations   Status Achieved               Plan - 03/16/14 1542    Clinical Impression Statement Strength is progressing and gait is improving however still with intermittent antalgic gait which includes mininimal R knee Flexion.  Current impairments include PROM Flexion to 100 but less painful than yesterday and PROM Ext to 2.  TKE AROM still limited with OKC and CKC activities.   PT Next Visit Plan Continue PROM and AROM as well as scar massage   Consulted and Agree with Plan of Care Patient         Problem List Patient Active Problem List   Diagnosis Date Noted  . 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 Rash and nonspecific skin eruption 04/30/2013  . Cellulitis of finger of left hand 04/30/2013  . Routine general medical examination at a health care facility 01/22/2013  . BPH (benign prostatic hypertrophy) 11/28/2012  . Need for prophylactic vaccination and inoculation against influenza 11/28/2012  . Bradycardia 10/01/2011  . Tobacco abuse 10/01/2011  . Diverticular disease   . Neck pain   . Hyperlipidemia   . Allergic rhinitis   . Allergic sinusitis   . GERD (gastroesophageal reflux disease)   . Seborrheic keratosis   . Osteoarthritis   . Erectile dysfunction   . Colon polyp     Daianna Vasques PT, OCS 03/16/2014, 3:58 PM  [facility] [address]  [place], [place], [address] Phone: [phone]   Fax:  [phone]

## 2014-03-21 ENCOUNTER — Encounter: Payer: Self-pay | Admitting: Rehabilitation

## 2014-03-21 ENCOUNTER — Ambulatory Visit: Payer: Medicare Other | Attending: Orthopedic Surgery | Admitting: Rehabilitation

## 2014-03-21 DIAGNOSIS — M25661 Stiffness of right knee, not elsewhere classified: Secondary | ICD-10-CM

## 2014-03-21 DIAGNOSIS — Z96651 Presence of right artificial knee joint: Secondary | ICD-10-CM | POA: Insufficient documentation

## 2014-03-21 DIAGNOSIS — R262 Difficulty in walking, not elsewhere classified: Secondary | ICD-10-CM | POA: Diagnosis not present

## 2014-03-21 DIAGNOSIS — M25561 Pain in right knee: Secondary | ICD-10-CM | POA: Diagnosis not present

## 2014-03-21 NOTE — Therapy (Signed)
[facility] [address]  [place], [place], [address] Phone: [phone]   Fax:  [phone]  Physical Therapy Treatment  Patient Details  Name: Alec Johnson MRN: [id number] Date of Birth: 04/02/1950 Referring Provider:  Tobi Bastos, MD  Encounter Date: 03/21/2014      PT End of Session - 03/21/14 1148    Visit Number 17   Number of Visits 26   Date for PT Re-Evaluation 04/12/14   PT Start Time 1100   PT Stop Time 1146   PT Time Calculation (min) 46 min   Activity Tolerance Patient tolerated treatment well      Past Medical History  Diagnosis Date  . Diverticular disease   . Hyperlipidemia   . Seborrheic keratosis   . Osteoarthritis   . Erectile dysfunction   . Colon polyp   . GERD (gastroesophageal reflux disease)     NO Recent problems    Past Surgical History  Procedure Laterality Date  . Leg surgery  1966    PINNING DUE TO INJURY  . Athroscopic knee surgery      BIL KNEES  . Nose surgery    . Total knee arthroplasty Right 01/18/2014    Procedure: RIGHT TOTAL KNEE ARTHROPLASTY;  Surgeon: Tobi Bastos, MD;  Location: [facility];  Service: Orthopedics;  Laterality: Right;    There were no vitals taken for this visit.  Visit Diagnosis:  Knee stiffness, right  Knee pain, acute, right      Subjective Assessment - 03/21/14 1104    Symptoms The same as it alway is.  Just stiff and sore. Having good and bad times   Currently in Pain? Yes   Pain Score 2    Pain Location Knee   Pain Orientation Right   Aggravating Factors  flexion   Pain Relieving Factors rest, ice, medication          [facility] PT Assessment - 03/21/14 0001    Assessment   Next MD Visit 04/07/14                  [facility] Adult PT Treatment/Exercise - 03/21/14 0001    Knee/Hip Exercises: Stretches   Sports administrator Other (comment)  Prone 6x20" with CR during 3 of them and flexion ROM focus   Knee: Self-Stretch  to increase Flexion 3 reps;10 seconds  with strap   Knee/Hip Exercises: Aerobic   Stationary Bike 66min forward   Manual Therapy   Manual Therapy --   Joint Mobilization R knee extension mobilizations Grade IV+ 6x20" to tolerace   Massage Sidelying STM ITB distally manually and with stick   Passive ROM To tolerace into flexion supine                     PT Long Term Goals - 03/15/14 1449    PT LONG TERM GOAL #1   Title be Independent with HEP (04/12/14)   Status On-going   PT LONG TERM GOAL #2   Title improve AROM to 5-110 or greater (04/12/14)   Status On-going   PT LONG TERM GOAL #3   Title return to ambulation without AD with correct gait biomechanics   (04/12/14)   Status On-going   PT LONG TERM GOAL #4   Title return to driving without limitations   Status Achieved               Plan - 03/21/14 1149  Clinical Impression Statement Continues to struggle with ROM.  Doing well independently with strengthening at the gym   PT Next Visit Plan Continue PROM and AROM as well as scar massage        Problem List Patient Active Problem List   Diagnosis Date Noted  . 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 Rash and nonspecific skin eruption 04/30/2013  . Cellulitis of finger of left hand 04/30/2013  . Routine general medical examination at a health care facility 01/22/2013  . BPH (benign prostatic hypertrophy) 11/28/2012  . Need for prophylactic vaccination and inoculation against influenza 11/28/2012  . Bradycardia 10/01/2011  . Tobacco abuse 10/01/2011  . Diverticular disease   . Neck pain   . Hyperlipidemia   . Allergic rhinitis   . Allergic sinusitis   . GERD (gastroesophageal reflux disease)   . Seborrheic keratosis   . Osteoarthritis   . Erectile dysfunction   . Colon polyp     Stark Bray, DPT, CMP 03/21/2014, 11:50 AM  [facility] [place], [place],  [address] Phone: [phone]   Fax:  [phone]

## 2014-03-22 ENCOUNTER — Ambulatory Visit: Payer: Medicare Other

## 2014-03-23 ENCOUNTER — Ambulatory Visit: Payer: Medicare Other | Admitting: Rehabilitation

## 2014-03-23 DIAGNOSIS — M25561 Pain in right knee: Secondary | ICD-10-CM | POA: Diagnosis not present

## 2014-03-23 DIAGNOSIS — M25661 Stiffness of right knee, not elsewhere classified: Secondary | ICD-10-CM | POA: Diagnosis not present

## 2014-03-23 DIAGNOSIS — R262 Difficulty in walking, not elsewhere classified: Secondary | ICD-10-CM | POA: Diagnosis not present

## 2014-03-23 DIAGNOSIS — Z96651 Presence of right artificial knee joint: Secondary | ICD-10-CM | POA: Diagnosis not present

## 2014-03-23 NOTE — Therapy (Signed)
[facility] [address]  [place], [place], [address] Phone: [phone]   Fax:  [phone]  Physical Therapy Treatment  Patient Details  Name: GERRICK RAY MRN: [id number] Date of Birth: 01-Oct-1950 Referring Provider:  Tobi Bastos, MD  Encounter Date: 03/23/2014      PT End of Session - 03/23/14 1057    Visit Number 18   Number of Visits 26   Date for PT Re-Evaluation 04/12/14   PT Start Time 1015   PT Stop Time 1057   PT Time Calculation (min) 42 min   Activity Tolerance Patient tolerated treatment well   Behavior During Therapy [facility] for tasks assessed/performed      Past Medical History  Diagnosis Date  . Diverticular disease   . Hyperlipidemia   . Seborrheic keratosis   . Osteoarthritis   . Erectile dysfunction   . Colon polyp   . GERD (gastroesophageal reflux disease)     NO Recent problems    Past Surgical History  Procedure Laterality Date  . Leg surgery  1966    PINNING DUE TO INJURY  . Athroscopic knee surgery      BIL KNEES  . Nose surgery    . Total knee arthroplasty Right 01/18/2014    Procedure: RIGHT TOTAL KNEE ARTHROPLASTY;  Surgeon: Tobi Bastos, MD;  Location: [facility];  Service: Orthopedics;  Laterality: Right;    There were no vitals taken for this visit.  Visit Diagnosis:  Knee stiffness, right  Knee pain, acute, right  Trouble walking      Subjective Assessment - 03/23/14 1016    Symptoms Had a pretty good night last night. Continued pain with flexion ranging from 0-5/10. Sees MD on the 18th (in 2 weeks).   Currently in Pain? Yes   Pain Score 1    Pain Location Knee   Pain Orientation Right   Pain Relieving Factors flexion   Effect of Pain on Daily Activities ice, meds, resting.    Multiple Pain Sites No                    [facility] Adult PT Treatment/Exercise - 03/23/14 1017    Knee/Hip Exercises: Stretches   Sports administrator --  Prone  contract/relax 3x20" then without contract/relax 3x20"   Hip Flexor Stretch 3 reps;30 seconds  Supine Rt leg off table   Knee: Self-Stretch to increase Flexion --  on low row seat 5" x 10   Knee/Hip Exercises: Aerobic   Stationary Bike 63min forward   Manual Therapy   Manual Therapy Passive ROM;Joint mobilization;Massage   Joint Mobilization Rt knee flexion and extension mobs Gr III-IV   Massage Sidelying STM ITB manually   Passive ROM to tolerance into flexion and extension                     PT Long Term Goals - 03/15/14 1449    PT LONG TERM GOAL #1   Title be Independent with HEP (04/12/14)   Status On-going   PT LONG TERM GOAL #2   Title improve AROM to 5-110 or greater (04/12/14)   Status On-going   PT LONG TERM GOAL #3   Title return to ambulation without AD with correct gait biomechanics   (04/12/14)   Status On-going   PT LONG TERM GOAL #4   Title return to driving without limitations   Status Achieved  Plan - 03/23/14 1057    Clinical Impression Statement Knee continues to be painful and very stiff. Patient is compliant with gym program and stretching HEP at home.    Consulted and Agree with Plan of Care Patient        Problem List Patient Active Problem List   Diagnosis Date Noted  . 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 Rash and nonspecific skin eruption 04/30/2013  . Cellulitis of finger of left hand 04/30/2013  . Routine general medical examination at a health care facility 01/22/2013  . BPH (benign prostatic hypertrophy) 11/28/2012  . Need for prophylactic vaccination and inoculation against influenza 11/28/2012  . Bradycardia 10/01/2011  . Tobacco abuse 10/01/2011  . Diverticular disease   . Neck pain   . Hyperlipidemia   . Allergic rhinitis   . Allergic sinusitis   . GERD (gastroesophageal reflux disease)   . Seborrheic keratosis   . Osteoarthritis   . Erectile dysfunction   . Colon polyp     Barbette Hair, PTA 03/23/2014, 10:59 AM  [facility] [address]  [place], [place], [address] Phone: [phone]   Fax:  [phone]

## 2014-03-27 ENCOUNTER — Encounter: Payer: Self-pay | Admitting: Rehabilitation

## 2014-03-27 ENCOUNTER — Ambulatory Visit: Payer: Medicare Other | Admitting: Rehabilitation

## 2014-03-27 DIAGNOSIS — R262 Difficulty in walking, not elsewhere classified: Secondary | ICD-10-CM

## 2014-03-27 DIAGNOSIS — M25561 Pain in right knee: Secondary | ICD-10-CM | POA: Diagnosis not present

## 2014-03-27 DIAGNOSIS — Z96651 Presence of right artificial knee joint: Secondary | ICD-10-CM | POA: Diagnosis not present

## 2014-03-27 DIAGNOSIS — M25661 Stiffness of right knee, not elsewhere classified: Secondary | ICD-10-CM | POA: Diagnosis not present

## 2014-03-27 NOTE — Therapy (Signed)
[facility] [address]  [place], [place], [address] Phone: [phone]   Fax:  [phone]  Physical Therapy Treatment  Patient Details  Name: Alec Johnson MRN: [id number] Date of Birth: May 02, 1950 Referring Provider:  Latanya Maudlin, MD  Encounter Date: 03/27/2014      PT End of Session - 03/27/14 1149    Visit Number 19   Number of Visits 26   Date for PT Re-Evaluation 04/12/14   PT Start Time 1100   PT Stop Time 1145   PT Time Calculation (min) 45 min   Activity Tolerance Patient tolerated treatment well      Past Medical History  Diagnosis Date  . Diverticular disease   . Hyperlipidemia   . Seborrheic keratosis   . Osteoarthritis   . Erectile dysfunction   . Colon polyp   . GERD (gastroesophageal reflux disease)     NO Recent problems    Past Surgical History  Procedure Laterality Date  . Leg surgery  1966    PINNING DUE TO INJURY  . Athroscopic knee surgery      BIL KNEES  . Nose surgery    . Total knee arthroplasty Right 01/18/2014    Procedure: RIGHT TOTAL KNEE ARTHROPLASTY;  Surgeon: Tobi Bastos, MD;  Location: [facility];  Service: Orthopedics;  Laterality: Right;    There were no vitals taken for this visit.  Visit Diagnosis:  Knee stiffness, right  Knee pain, acute, right  Trouble walking      Subjective Assessment - 03/27/14 1108    Symptoms Really "hurt myself" on saturday trying to bend it on the step.  Was not able to do much for the next 24 hours. Just stiff and sore like always   Currently in Pain? Yes   Pain Score 2    Pain Location Knee   Pain Orientation Right   Aggravating Factors  flexion   Pain Relieving Factors rest, ice          [facility] PT Assessment - 03/27/14 0001    AROM   Right Knee Extension 9   Right Knee Flexion 90   PROM   Overall PROM  Other (comment)  R knee to 97   PROM Assessment Site Knee   Right/Left Knee Right                   [facility] Adult PT Treatment/Exercise - 03/27/14 0001    Knee/Hip Exercises: Aerobic   Stationary Bike 10 min forward   Knee/Hip Exercises: Standing   Forward Lunges 10 reps  onto chair   Rebounder SL stance on blue theradisc with red ball toss x30   Other Standing Knee Exercises 4way hip R Green Theraband 1 pole x 30 each. Increased R lateral knee pain with abduction   Knee/Hip Exercises: Supine   Bridges 20 reps  on green ball   Other Supine Knee Exercises green ball rollin/out x 10   Manual Therapy   Joint Mobilization Rt knee flexion and extension mobs Gr III-IV   Passive ROM to tolerance into flexion and extension                     PT Long Term Goals - 03/27/14 1151    PT LONG TERM GOAL #1   Status Achieved               Plan - 03/27/14 1150    Clinical Impression Statement  Discussed with patient continuing therapy versus just doing gym activities independently due to plateau in status and independence with HEP.  Pt wanting to at least finish out this week. AROM has decreased to 90deg from 95 on the last measure and remains at 97deg for the past few weeks   PT Next Visit Plan continue ROM   Consulted and Agree with Plan of Care Patient        Problem List Patient Active Problem List   Diagnosis Date Noted  . 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 Rash and nonspecific skin eruption 04/30/2013  . Cellulitis of finger of left hand 04/30/2013  . Routine general medical examination at a health care facility 01/22/2013  . BPH (benign prostatic hypertrophy) 11/28/2012  . Need for prophylactic vaccination and inoculation against influenza 11/28/2012  . Bradycardia 10/01/2011  . Tobacco abuse 10/01/2011  . Diverticular disease   . Neck pain   . Hyperlipidemia   . Allergic rhinitis   . Allergic sinusitis   . GERD (gastroesophageal reflux disease)   . Seborrheic keratosis   . Osteoarthritis   . Erectile dysfunction   .  Colon polyp     Stark Bray, DPT, CMP 03/27/2014, 11:52 AM  [facility] [place], [place], [address] Phone: [phone]   Fax:  [phone]

## 2014-03-29 ENCOUNTER — Ambulatory Visit: Payer: Medicare Other | Admitting: Rehabilitation

## 2014-03-29 DIAGNOSIS — M25661 Stiffness of right knee, not elsewhere classified: Secondary | ICD-10-CM | POA: Diagnosis not present

## 2014-03-29 DIAGNOSIS — M25561 Pain in right knee: Secondary | ICD-10-CM

## 2014-03-29 DIAGNOSIS — Z96651 Presence of right artificial knee joint: Secondary | ICD-10-CM | POA: Diagnosis not present

## 2014-03-29 DIAGNOSIS — R262 Difficulty in walking, not elsewhere classified: Secondary | ICD-10-CM | POA: Diagnosis not present

## 2014-03-29 NOTE — Therapy (Addendum)
[facility] [address]  [place], [place], [address] Phone: [phone]   Fax:  [phone]  Physical Therapy Treatment  Patient Details  Name: Alec Johnson MRN: [id number] Date of Birth: [birth year] Referring Provider:  Latanya Maudlin, MD  Encounter Date: 03/29/2014      PT End of Session - 03/29/14 1054    Visit Number 20   Number of Visits 26   Date for PT Re-Evaluation 04/11/04   PT Start Time 1051   PT Stop Time 1130   PT Time Calculation (min) 39 min   Activity Tolerance Patient tolerated treatment well   Behavior During Therapy [facility] for tasks assessed/performed       There were no vitals taken for this visit.  Visit Diagnosis:  Knee stiffness, right  Knee pain, acute, right  Trouble walking      Subjective Assessment - 03/29/14 1055    Symptoms Walked a mile yesterday and it hurt really bad afterwards. States he could barely do his exercises last night or today. Wants to hold therapy until he sees his MD next week.    Currently in Pain? Yes   Pain Score 4    Pain Location Knee   Pain Orientation Right   Pain Descriptors / Indicators Aching;Throbbing   Aggravating Factors  ROM work, long walking   Pain Relieving Factors rest, ice   Multiple Pain Sites No          [facility] PT Assessment - 03/29/14 0001    Observation/Other Assessments   Focus on Therapeutic Outcomes (FOTO)  49% limited   AROM   Right Knee Extension 9   Right Knee Flexion 90   PROM   Overall PROM  Other (comment)  97   PROM Assessment Site Knee   Right/Left Knee Right                  [facility] Adult PT Treatment/Exercise - 03/29/14 1057    Knee/Hip Exercises: Stretches   Quad Stretch 3 reps;20 seconds  Prone, manual   Knee/Hip Exercises: Aerobic   Stationary Bike 10 min forward   Knee/Hip Exercises: Standing   Forward Lunges 10 reps  onto chair   Knee/Hip Exercises: Seated   Other Seated Knee Exercises  Knee flexion stretch 5 second holdx5   Knee/Hip Exercises: Supine   Other Supine Knee Exercises green ball rollin/out x 15   Manual Therapy   Manual Therapy Passive ROM;Joint mobilization;Massage   Joint Mobilization Rt knee flexion and extension mobs Gr III-IV   Massage STM to knee with lateral focus   Passive ROM to tolerance into flexion and extension                PT Education - 03/29/14 1140    Education provided Yes   Education Details If MD wants to do a manipulation, what to expect and aftercare   Person(s) Educated Patient   Methods Explanation   Comprehension Verbalized understanding             PT Long Term Goals - 03/29/14 1143    PT LONG TERM GOAL #1   Title be Independent with HEP (04/12/14)   Status Achieved   PT LONG TERM GOAL #2   Title improve AROM to 5-110 or greater (04/12/14)   Status On-going   PT LONG TERM GOAL #3   Title return to ambulation without AD with correct gait biomechanics   (04/12/14)   Status On-going  PT LONG TERM GOAL #4   Title return to driving without limitations   Status Achieved               Plan - 2014-04-20 1141    Clinical Impression Statement Still very limited motion with no new improvements. Patient wants to wait on therapy until he see MD. Stated he will continue gym and home exercise program in the mean time. Feel as though he could benefit from a manipulation to improve motion and pain.           G-Codes - 2014-04-20 1103    Functional Limitation Mobility: Walking and moving around   Mobility: Walking and Moving Around Current Status [phone]) At least 40 percent but less than 60 percent impaired, limited or restricted   Mobility: Walking and Moving Around Goal Status [phone]) At least 20 percent but less than 40 percent impaired, limited or restricted        Barbette Hair, PTA 20-Apr-2014, 11:44 AM  [facility] [address]  [place], [place], [address] Phone: [phone]   Fax:  [phone]   PHYSICAL THERAPY DISCHARGE SUMMARY  Visits from Start of Care: 20  Current functional level related to goals / functional outcomes: Alec Johnson continues to struggle with knee ROM and stiffness. Other than that his current functional level has returned to [facility].  Still unable to play golf.    Remaining deficits: Decreased knee ROM and stiffness   Education / Equipment: HEP  Plan: Patient agrees to discharge.  Patient goals were partially met. Patient is being discharged due to lack of progress.  ?????    Shan Levans, DPT, CMP

## 2014-03-31 ENCOUNTER — Ambulatory Visit: Payer: Medicare Other | Admitting: Rehabilitation

## 2014-04-03 ENCOUNTER — Ambulatory Visit: Payer: Medicare Other | Admitting: Rehabilitation

## 2014-04-05 ENCOUNTER — Ambulatory Visit: Payer: Medicare Other | Admitting: Rehabilitation

## 2014-04-07 ENCOUNTER — Ambulatory Visit: Payer: Medicare Other

## 2014-04-07 DIAGNOSIS — Z471 Aftercare following joint replacement surgery: Secondary | ICD-10-CM | POA: Diagnosis not present

## 2014-04-07 DIAGNOSIS — Z96651 Presence of right artificial knee joint: Secondary | ICD-10-CM | POA: Diagnosis not present

## 2014-04-21 DIAGNOSIS — Z471 Aftercare following joint replacement surgery: Secondary | ICD-10-CM | POA: Diagnosis not present

## 2014-04-21 DIAGNOSIS — Z96651 Presence of right artificial knee joint: Secondary | ICD-10-CM | POA: Diagnosis not present

## 2014-05-08 DIAGNOSIS — E291 Testicular hypofunction: Secondary | ICD-10-CM | POA: Diagnosis not present

## 2014-05-15 DIAGNOSIS — N401 Enlarged prostate with lower urinary tract symptoms: Secondary | ICD-10-CM | POA: Diagnosis not present

## 2014-05-22 DIAGNOSIS — R351 Nocturia: Secondary | ICD-10-CM | POA: Diagnosis not present

## 2014-07-04 DIAGNOSIS — G4733 Obstructive sleep apnea (adult) (pediatric): Secondary | ICD-10-CM | POA: Diagnosis not present

## 2014-07-17 DIAGNOSIS — Z96651 Presence of right artificial knee joint: Secondary | ICD-10-CM | POA: Diagnosis not present

## 2014-07-17 DIAGNOSIS — Z471 Aftercare following joint replacement surgery: Secondary | ICD-10-CM | POA: Diagnosis not present

## 2014-08-14 DIAGNOSIS — G4733 Obstructive sleep apnea (adult) (pediatric): Secondary | ICD-10-CM | POA: Diagnosis not present

## 2014-08-14 DIAGNOSIS — N529 Male erectile dysfunction, unspecified: Secondary | ICD-10-CM | POA: Diagnosis not present

## 2014-08-14 DIAGNOSIS — R351 Nocturia: Secondary | ICD-10-CM | POA: Diagnosis not present

## 2014-09-21 DIAGNOSIS — M542 Cervicalgia: Secondary | ICD-10-CM | POA: Diagnosis not present

## 2014-09-21 DIAGNOSIS — M5032 Other cervical disc degeneration, mid-cervical region: Secondary | ICD-10-CM | POA: Diagnosis not present

## 2014-10-16 DIAGNOSIS — Z96651 Presence of right artificial knee joint: Secondary | ICD-10-CM | POA: Diagnosis not present

## 2014-10-16 DIAGNOSIS — M7742 Metatarsalgia, left foot: Secondary | ICD-10-CM | POA: Diagnosis not present

## 2014-10-16 DIAGNOSIS — Z471 Aftercare following joint replacement surgery: Secondary | ICD-10-CM | POA: Diagnosis not present

## 2014-10-18 DIAGNOSIS — M5032 Other cervical disc degeneration, mid-cervical region: Secondary | ICD-10-CM | POA: Diagnosis not present

## 2014-12-06 DIAGNOSIS — M50322 Other cervical disc degeneration at C5-C6 level: Secondary | ICD-10-CM | POA: Diagnosis not present

## 2014-12-10 DIAGNOSIS — Z23 Encounter for immunization: Secondary | ICD-10-CM | POA: Diagnosis not present

## 2014-12-27 DIAGNOSIS — M50322 Other cervical disc degeneration at C5-C6 level: Secondary | ICD-10-CM | POA: Diagnosis not present

## 2014-12-27 DIAGNOSIS — M542 Cervicalgia: Secondary | ICD-10-CM | POA: Diagnosis not present

## 2015-01-03 DIAGNOSIS — M542 Cervicalgia: Secondary | ICD-10-CM | POA: Diagnosis not present

## 2015-01-09 DIAGNOSIS — M50322 Other cervical disc degeneration at C5-C6 level: Secondary | ICD-10-CM | POA: Diagnosis not present

## 2015-01-09 DIAGNOSIS — M50123 Cervical disc disorder at C6-C7 level with radiculopathy: Secondary | ICD-10-CM | POA: Diagnosis not present

## 2015-01-09 DIAGNOSIS — M542 Cervicalgia: Secondary | ICD-10-CM | POA: Diagnosis not present

## 2015-03-15 DIAGNOSIS — M9903 Segmental and somatic dysfunction of lumbar region: Secondary | ICD-10-CM | POA: Diagnosis not present

## 2015-03-15 DIAGNOSIS — M5136 Other intervertebral disc degeneration, lumbar region: Secondary | ICD-10-CM | POA: Diagnosis not present

## 2015-03-15 DIAGNOSIS — M5441 Lumbago with sciatica, right side: Secondary | ICD-10-CM | POA: Diagnosis not present

## 2015-03-15 DIAGNOSIS — M5137 Other intervertebral disc degeneration, lumbosacral region: Secondary | ICD-10-CM | POA: Diagnosis not present

## 2015-03-16 DIAGNOSIS — M5137 Other intervertebral disc degeneration, lumbosacral region: Secondary | ICD-10-CM | POA: Diagnosis not present

## 2015-03-16 DIAGNOSIS — M5441 Lumbago with sciatica, right side: Secondary | ICD-10-CM | POA: Diagnosis not present

## 2015-03-16 DIAGNOSIS — M9903 Segmental and somatic dysfunction of lumbar region: Secondary | ICD-10-CM | POA: Diagnosis not present

## 2015-03-16 DIAGNOSIS — M5136 Other intervertebral disc degeneration, lumbar region: Secondary | ICD-10-CM | POA: Diagnosis not present

## 2015-03-19 DIAGNOSIS — M9903 Segmental and somatic dysfunction of lumbar region: Secondary | ICD-10-CM | POA: Diagnosis not present

## 2015-03-19 DIAGNOSIS — M5137 Other intervertebral disc degeneration, lumbosacral region: Secondary | ICD-10-CM | POA: Diagnosis not present

## 2015-03-19 DIAGNOSIS — M5136 Other intervertebral disc degeneration, lumbar region: Secondary | ICD-10-CM | POA: Diagnosis not present

## 2015-03-19 DIAGNOSIS — M5441 Lumbago with sciatica, right side: Secondary | ICD-10-CM | POA: Diagnosis not present

## 2015-03-20 DIAGNOSIS — M5441 Lumbago with sciatica, right side: Secondary | ICD-10-CM | POA: Diagnosis not present

## 2015-03-20 DIAGNOSIS — M9903 Segmental and somatic dysfunction of lumbar region: Secondary | ICD-10-CM | POA: Diagnosis not present

## 2015-03-20 DIAGNOSIS — M5136 Other intervertebral disc degeneration, lumbar region: Secondary | ICD-10-CM | POA: Diagnosis not present

## 2015-03-20 DIAGNOSIS — M5137 Other intervertebral disc degeneration, lumbosacral region: Secondary | ICD-10-CM | POA: Diagnosis not present

## 2015-03-20 DIAGNOSIS — M50322 Other cervical disc degeneration at C5-C6 level: Secondary | ICD-10-CM | POA: Diagnosis not present

## 2015-03-22 DIAGNOSIS — M5136 Other intervertebral disc degeneration, lumbar region: Secondary | ICD-10-CM | POA: Diagnosis not present

## 2015-03-22 DIAGNOSIS — M5441 Lumbago with sciatica, right side: Secondary | ICD-10-CM | POA: Diagnosis not present

## 2015-03-22 DIAGNOSIS — M5137 Other intervertebral disc degeneration, lumbosacral region: Secondary | ICD-10-CM | POA: Diagnosis not present

## 2015-03-22 DIAGNOSIS — M9903 Segmental and somatic dysfunction of lumbar region: Secondary | ICD-10-CM | POA: Diagnosis not present

## 2015-03-26 DIAGNOSIS — M9903 Segmental and somatic dysfunction of lumbar region: Secondary | ICD-10-CM | POA: Diagnosis not present

## 2015-03-26 DIAGNOSIS — M5137 Other intervertebral disc degeneration, lumbosacral region: Secondary | ICD-10-CM | POA: Diagnosis not present

## 2015-03-26 DIAGNOSIS — M5136 Other intervertebral disc degeneration, lumbar region: Secondary | ICD-10-CM | POA: Diagnosis not present

## 2015-03-26 DIAGNOSIS — M5441 Lumbago with sciatica, right side: Secondary | ICD-10-CM | POA: Diagnosis not present

## 2015-03-27 DIAGNOSIS — M5441 Lumbago with sciatica, right side: Secondary | ICD-10-CM | POA: Diagnosis not present

## 2015-03-27 DIAGNOSIS — M5136 Other intervertebral disc degeneration, lumbar region: Secondary | ICD-10-CM | POA: Diagnosis not present

## 2015-03-27 DIAGNOSIS — M9903 Segmental and somatic dysfunction of lumbar region: Secondary | ICD-10-CM | POA: Diagnosis not present

## 2015-03-27 DIAGNOSIS — M5137 Other intervertebral disc degeneration, lumbosacral region: Secondary | ICD-10-CM | POA: Diagnosis not present

## 2015-04-02 DIAGNOSIS — M50121 Cervical disc disorder at C4-C5 level with radiculopathy: Secondary | ICD-10-CM | POA: Diagnosis not present

## 2015-04-02 DIAGNOSIS — M5441 Lumbago with sciatica, right side: Secondary | ICD-10-CM | POA: Diagnosis not present

## 2015-04-02 DIAGNOSIS — M9901 Segmental and somatic dysfunction of cervical region: Secondary | ICD-10-CM | POA: Diagnosis not present

## 2015-04-02 DIAGNOSIS — M5013 Cervical disc disorder with radiculopathy, cervicothoracic region: Secondary | ICD-10-CM | POA: Diagnosis not present

## 2015-04-02 DIAGNOSIS — M5137 Other intervertebral disc degeneration, lumbosacral region: Secondary | ICD-10-CM | POA: Diagnosis not present

## 2015-04-02 DIAGNOSIS — M50122 Cervical disc disorder at C5-C6 level with radiculopathy: Secondary | ICD-10-CM | POA: Diagnosis not present

## 2015-04-02 DIAGNOSIS — M50123 Cervical disc disorder at C6-C7 level with radiculopathy: Secondary | ICD-10-CM | POA: Diagnosis not present

## 2015-04-02 DIAGNOSIS — M9903 Segmental and somatic dysfunction of lumbar region: Secondary | ICD-10-CM | POA: Diagnosis not present

## 2015-04-03 DIAGNOSIS — M50121 Cervical disc disorder at C4-C5 level with radiculopathy: Secondary | ICD-10-CM | POA: Diagnosis not present

## 2015-04-03 DIAGNOSIS — M50123 Cervical disc disorder at C6-C7 level with radiculopathy: Secondary | ICD-10-CM | POA: Diagnosis not present

## 2015-04-03 DIAGNOSIS — M5441 Lumbago with sciatica, right side: Secondary | ICD-10-CM | POA: Diagnosis not present

## 2015-04-03 DIAGNOSIS — M5137 Other intervertebral disc degeneration, lumbosacral region: Secondary | ICD-10-CM | POA: Diagnosis not present

## 2015-04-03 DIAGNOSIS — M9903 Segmental and somatic dysfunction of lumbar region: Secondary | ICD-10-CM | POA: Diagnosis not present

## 2015-04-03 DIAGNOSIS — M50122 Cervical disc disorder at C5-C6 level with radiculopathy: Secondary | ICD-10-CM | POA: Diagnosis not present

## 2015-04-03 DIAGNOSIS — M5013 Cervical disc disorder with radiculopathy, cervicothoracic region: Secondary | ICD-10-CM | POA: Diagnosis not present

## 2015-04-03 DIAGNOSIS — M9901 Segmental and somatic dysfunction of cervical region: Secondary | ICD-10-CM | POA: Diagnosis not present

## 2015-04-05 DIAGNOSIS — M50121 Cervical disc disorder at C4-C5 level with radiculopathy: Secondary | ICD-10-CM | POA: Diagnosis not present

## 2015-04-05 DIAGNOSIS — M50122 Cervical disc disorder at C5-C6 level with radiculopathy: Secondary | ICD-10-CM | POA: Diagnosis not present

## 2015-04-05 DIAGNOSIS — M5441 Lumbago with sciatica, right side: Secondary | ICD-10-CM | POA: Diagnosis not present

## 2015-04-05 DIAGNOSIS — M9903 Segmental and somatic dysfunction of lumbar region: Secondary | ICD-10-CM | POA: Diagnosis not present

## 2015-04-05 DIAGNOSIS — M5137 Other intervertebral disc degeneration, lumbosacral region: Secondary | ICD-10-CM | POA: Diagnosis not present

## 2015-04-05 DIAGNOSIS — M50123 Cervical disc disorder at C6-C7 level with radiculopathy: Secondary | ICD-10-CM | POA: Diagnosis not present

## 2015-04-05 DIAGNOSIS — M5013 Cervical disc disorder with radiculopathy, cervicothoracic region: Secondary | ICD-10-CM | POA: Diagnosis not present

## 2015-04-05 DIAGNOSIS — M9901 Segmental and somatic dysfunction of cervical region: Secondary | ICD-10-CM | POA: Diagnosis not present

## 2015-04-09 DIAGNOSIS — M50122 Cervical disc disorder at C5-C6 level with radiculopathy: Secondary | ICD-10-CM | POA: Diagnosis not present

## 2015-04-09 DIAGNOSIS — M5137 Other intervertebral disc degeneration, lumbosacral region: Secondary | ICD-10-CM | POA: Diagnosis not present

## 2015-04-09 DIAGNOSIS — M9901 Segmental and somatic dysfunction of cervical region: Secondary | ICD-10-CM | POA: Diagnosis not present

## 2015-04-09 DIAGNOSIS — M50121 Cervical disc disorder at C4-C5 level with radiculopathy: Secondary | ICD-10-CM | POA: Diagnosis not present

## 2015-04-09 DIAGNOSIS — M5441 Lumbago with sciatica, right side: Secondary | ICD-10-CM | POA: Diagnosis not present

## 2015-04-09 DIAGNOSIS — M50123 Cervical disc disorder at C6-C7 level with radiculopathy: Secondary | ICD-10-CM | POA: Diagnosis not present

## 2015-04-09 DIAGNOSIS — M9903 Segmental and somatic dysfunction of lumbar region: Secondary | ICD-10-CM | POA: Diagnosis not present

## 2015-04-09 DIAGNOSIS — M5013 Cervical disc disorder with radiculopathy, cervicothoracic region: Secondary | ICD-10-CM | POA: Diagnosis not present

## 2015-04-12 DIAGNOSIS — M50122 Cervical disc disorder at C5-C6 level with radiculopathy: Secondary | ICD-10-CM | POA: Diagnosis not present

## 2015-04-12 DIAGNOSIS — M5137 Other intervertebral disc degeneration, lumbosacral region: Secondary | ICD-10-CM | POA: Diagnosis not present

## 2015-04-12 DIAGNOSIS — M50123 Cervical disc disorder at C6-C7 level with radiculopathy: Secondary | ICD-10-CM | POA: Diagnosis not present

## 2015-04-12 DIAGNOSIS — M50121 Cervical disc disorder at C4-C5 level with radiculopathy: Secondary | ICD-10-CM | POA: Diagnosis not present

## 2015-04-12 DIAGNOSIS — M9903 Segmental and somatic dysfunction of lumbar region: Secondary | ICD-10-CM | POA: Diagnosis not present

## 2015-04-12 DIAGNOSIS — M9901 Segmental and somatic dysfunction of cervical region: Secondary | ICD-10-CM | POA: Diagnosis not present

## 2015-04-12 DIAGNOSIS — M5441 Lumbago with sciatica, right side: Secondary | ICD-10-CM | POA: Diagnosis not present

## 2015-04-12 DIAGNOSIS — M5013 Cervical disc disorder with radiculopathy, cervicothoracic region: Secondary | ICD-10-CM | POA: Diagnosis not present

## 2015-04-16 DIAGNOSIS — M50122 Cervical disc disorder at C5-C6 level with radiculopathy: Secondary | ICD-10-CM | POA: Diagnosis not present

## 2015-04-16 DIAGNOSIS — M9903 Segmental and somatic dysfunction of lumbar region: Secondary | ICD-10-CM | POA: Diagnosis not present

## 2015-04-16 DIAGNOSIS — M9901 Segmental and somatic dysfunction of cervical region: Secondary | ICD-10-CM | POA: Diagnosis not present

## 2015-04-16 DIAGNOSIS — M5137 Other intervertebral disc degeneration, lumbosacral region: Secondary | ICD-10-CM | POA: Diagnosis not present

## 2015-04-16 DIAGNOSIS — M5441 Lumbago with sciatica, right side: Secondary | ICD-10-CM | POA: Diagnosis not present

## 2015-04-16 DIAGNOSIS — M50121 Cervical disc disorder at C4-C5 level with radiculopathy: Secondary | ICD-10-CM | POA: Diagnosis not present

## 2015-04-16 DIAGNOSIS — M5013 Cervical disc disorder with radiculopathy, cervicothoracic region: Secondary | ICD-10-CM | POA: Diagnosis not present

## 2015-04-16 DIAGNOSIS — M50123 Cervical disc disorder at C6-C7 level with radiculopathy: Secondary | ICD-10-CM | POA: Diagnosis not present

## 2015-04-19 DIAGNOSIS — M50122 Cervical disc disorder at C5-C6 level with radiculopathy: Secondary | ICD-10-CM | POA: Diagnosis not present

## 2015-04-19 DIAGNOSIS — M50121 Cervical disc disorder at C4-C5 level with radiculopathy: Secondary | ICD-10-CM | POA: Diagnosis not present

## 2015-04-19 DIAGNOSIS — M5013 Cervical disc disorder with radiculopathy, cervicothoracic region: Secondary | ICD-10-CM | POA: Diagnosis not present

## 2015-04-19 DIAGNOSIS — M9901 Segmental and somatic dysfunction of cervical region: Secondary | ICD-10-CM | POA: Diagnosis not present

## 2015-04-19 DIAGNOSIS — M5137 Other intervertebral disc degeneration, lumbosacral region: Secondary | ICD-10-CM | POA: Diagnosis not present

## 2015-04-19 DIAGNOSIS — M50123 Cervical disc disorder at C6-C7 level with radiculopathy: Secondary | ICD-10-CM | POA: Diagnosis not present

## 2015-04-19 DIAGNOSIS — M5441 Lumbago with sciatica, right side: Secondary | ICD-10-CM | POA: Diagnosis not present

## 2015-04-19 DIAGNOSIS — M9903 Segmental and somatic dysfunction of lumbar region: Secondary | ICD-10-CM | POA: Diagnosis not present

## 2015-04-23 DIAGNOSIS — M5137 Other intervertebral disc degeneration, lumbosacral region: Secondary | ICD-10-CM | POA: Diagnosis not present

## 2015-04-23 DIAGNOSIS — M50121 Cervical disc disorder at C4-C5 level with radiculopathy: Secondary | ICD-10-CM | POA: Diagnosis not present

## 2015-04-23 DIAGNOSIS — M9901 Segmental and somatic dysfunction of cervical region: Secondary | ICD-10-CM | POA: Diagnosis not present

## 2015-04-23 DIAGNOSIS — M50123 Cervical disc disorder at C6-C7 level with radiculopathy: Secondary | ICD-10-CM | POA: Diagnosis not present

## 2015-04-23 DIAGNOSIS — M5441 Lumbago with sciatica, right side: Secondary | ICD-10-CM | POA: Diagnosis not present

## 2015-04-23 DIAGNOSIS — M5013 Cervical disc disorder with radiculopathy, cervicothoracic region: Secondary | ICD-10-CM | POA: Diagnosis not present

## 2015-04-23 DIAGNOSIS — M50122 Cervical disc disorder at C5-C6 level with radiculopathy: Secondary | ICD-10-CM | POA: Diagnosis not present

## 2015-04-23 DIAGNOSIS — M9903 Segmental and somatic dysfunction of lumbar region: Secondary | ICD-10-CM | POA: Diagnosis not present

## 2015-04-27 DIAGNOSIS — M5013 Cervical disc disorder with radiculopathy, cervicothoracic region: Secondary | ICD-10-CM | POA: Diagnosis not present

## 2015-04-27 DIAGNOSIS — M9903 Segmental and somatic dysfunction of lumbar region: Secondary | ICD-10-CM | POA: Diagnosis not present

## 2015-04-27 DIAGNOSIS — M50121 Cervical disc disorder at C4-C5 level with radiculopathy: Secondary | ICD-10-CM | POA: Diagnosis not present

## 2015-04-27 DIAGNOSIS — M50122 Cervical disc disorder at C5-C6 level with radiculopathy: Secondary | ICD-10-CM | POA: Diagnosis not present

## 2015-04-27 DIAGNOSIS — M5441 Lumbago with sciatica, right side: Secondary | ICD-10-CM | POA: Diagnosis not present

## 2015-04-27 DIAGNOSIS — M50123 Cervical disc disorder at C6-C7 level with radiculopathy: Secondary | ICD-10-CM | POA: Diagnosis not present

## 2015-04-27 DIAGNOSIS — M5137 Other intervertebral disc degeneration, lumbosacral region: Secondary | ICD-10-CM | POA: Diagnosis not present

## 2015-04-27 DIAGNOSIS — M9901 Segmental and somatic dysfunction of cervical region: Secondary | ICD-10-CM | POA: Diagnosis not present

## 2015-04-30 DIAGNOSIS — M50121 Cervical disc disorder at C4-C5 level with radiculopathy: Secondary | ICD-10-CM | POA: Diagnosis not present

## 2015-04-30 DIAGNOSIS — M5013 Cervical disc disorder with radiculopathy, cervicothoracic region: Secondary | ICD-10-CM | POA: Diagnosis not present

## 2015-04-30 DIAGNOSIS — M9901 Segmental and somatic dysfunction of cervical region: Secondary | ICD-10-CM | POA: Diagnosis not present

## 2015-04-30 DIAGNOSIS — M5441 Lumbago with sciatica, right side: Secondary | ICD-10-CM | POA: Diagnosis not present

## 2015-04-30 DIAGNOSIS — M50122 Cervical disc disorder at C5-C6 level with radiculopathy: Secondary | ICD-10-CM | POA: Diagnosis not present

## 2015-04-30 DIAGNOSIS — M50123 Cervical disc disorder at C6-C7 level with radiculopathy: Secondary | ICD-10-CM | POA: Diagnosis not present

## 2015-04-30 DIAGNOSIS — M9903 Segmental and somatic dysfunction of lumbar region: Secondary | ICD-10-CM | POA: Diagnosis not present

## 2015-04-30 DIAGNOSIS — M5137 Other intervertebral disc degeneration, lumbosacral region: Secondary | ICD-10-CM | POA: Diagnosis not present

## 2015-05-02 DIAGNOSIS — M9901 Segmental and somatic dysfunction of cervical region: Secondary | ICD-10-CM | POA: Diagnosis not present

## 2015-05-02 DIAGNOSIS — M5441 Lumbago with sciatica, right side: Secondary | ICD-10-CM | POA: Diagnosis not present

## 2015-05-02 DIAGNOSIS — M50123 Cervical disc disorder at C6-C7 level with radiculopathy: Secondary | ICD-10-CM | POA: Diagnosis not present

## 2015-05-02 DIAGNOSIS — M9903 Segmental and somatic dysfunction of lumbar region: Secondary | ICD-10-CM | POA: Diagnosis not present

## 2015-05-02 DIAGNOSIS — M50122 Cervical disc disorder at C5-C6 level with radiculopathy: Secondary | ICD-10-CM | POA: Diagnosis not present

## 2015-05-02 DIAGNOSIS — M5013 Cervical disc disorder with radiculopathy, cervicothoracic region: Secondary | ICD-10-CM | POA: Diagnosis not present

## 2015-05-02 DIAGNOSIS — M50121 Cervical disc disorder at C4-C5 level with radiculopathy: Secondary | ICD-10-CM | POA: Diagnosis not present

## 2015-05-02 DIAGNOSIS — M5137 Other intervertebral disc degeneration, lumbosacral region: Secondary | ICD-10-CM | POA: Diagnosis not present

## 2015-05-07 DIAGNOSIS — M9901 Segmental and somatic dysfunction of cervical region: Secondary | ICD-10-CM | POA: Diagnosis not present

## 2015-05-07 DIAGNOSIS — M50123 Cervical disc disorder at C6-C7 level with radiculopathy: Secondary | ICD-10-CM | POA: Diagnosis not present

## 2015-05-07 DIAGNOSIS — M5013 Cervical disc disorder with radiculopathy, cervicothoracic region: Secondary | ICD-10-CM | POA: Diagnosis not present

## 2015-05-07 DIAGNOSIS — M5441 Lumbago with sciatica, right side: Secondary | ICD-10-CM | POA: Diagnosis not present

## 2015-05-07 DIAGNOSIS — M9903 Segmental and somatic dysfunction of lumbar region: Secondary | ICD-10-CM | POA: Diagnosis not present

## 2015-05-07 DIAGNOSIS — M5137 Other intervertebral disc degeneration, lumbosacral region: Secondary | ICD-10-CM | POA: Diagnosis not present

## 2015-05-07 DIAGNOSIS — M50122 Cervical disc disorder at C5-C6 level with radiculopathy: Secondary | ICD-10-CM | POA: Diagnosis not present

## 2015-05-07 DIAGNOSIS — M50121 Cervical disc disorder at C4-C5 level with radiculopathy: Secondary | ICD-10-CM | POA: Diagnosis not present

## 2015-05-09 DIAGNOSIS — H52223 Regular astigmatism, bilateral: Secondary | ICD-10-CM | POA: Diagnosis not present

## 2015-05-09 DIAGNOSIS — H04123 Dry eye syndrome of bilateral lacrimal glands: Secondary | ICD-10-CM | POA: Diagnosis not present

## 2015-05-09 DIAGNOSIS — H524 Presbyopia: Secondary | ICD-10-CM | POA: Diagnosis not present

## 2015-05-09 DIAGNOSIS — H2513 Age-related nuclear cataract, bilateral: Secondary | ICD-10-CM | POA: Diagnosis not present

## 2015-05-14 DIAGNOSIS — M5013 Cervical disc disorder with radiculopathy, cervicothoracic region: Secondary | ICD-10-CM | POA: Diagnosis not present

## 2015-05-14 DIAGNOSIS — M50121 Cervical disc disorder at C4-C5 level with radiculopathy: Secondary | ICD-10-CM | POA: Diagnosis not present

## 2015-05-14 DIAGNOSIS — M5441 Lumbago with sciatica, right side: Secondary | ICD-10-CM | POA: Diagnosis not present

## 2015-05-14 DIAGNOSIS — M9901 Segmental and somatic dysfunction of cervical region: Secondary | ICD-10-CM | POA: Diagnosis not present

## 2015-05-14 DIAGNOSIS — M50122 Cervical disc disorder at C5-C6 level with radiculopathy: Secondary | ICD-10-CM | POA: Diagnosis not present

## 2015-05-14 DIAGNOSIS — M5137 Other intervertebral disc degeneration, lumbosacral region: Secondary | ICD-10-CM | POA: Diagnosis not present

## 2015-05-14 DIAGNOSIS — M9903 Segmental and somatic dysfunction of lumbar region: Secondary | ICD-10-CM | POA: Diagnosis not present

## 2015-05-14 DIAGNOSIS — M50123 Cervical disc disorder at C6-C7 level with radiculopathy: Secondary | ICD-10-CM | POA: Diagnosis not present

## 2015-09-17 DIAGNOSIS — E785 Hyperlipidemia, unspecified: Secondary | ICD-10-CM | POA: Diagnosis not present

## 2015-09-17 DIAGNOSIS — R358 Other polyuria: Secondary | ICD-10-CM | POA: Diagnosis not present

## 2015-09-17 DIAGNOSIS — J309 Allergic rhinitis, unspecified: Secondary | ICD-10-CM | POA: Diagnosis not present

## 2015-09-17 DIAGNOSIS — R0683 Snoring: Secondary | ICD-10-CM | POA: Diagnosis not present

## 2015-09-17 DIAGNOSIS — N400 Enlarged prostate without lower urinary tract symptoms: Secondary | ICD-10-CM | POA: Diagnosis not present

## 2015-11-08 DIAGNOSIS — N138 Other obstructive and reflux uropathy: Secondary | ICD-10-CM | POA: Diagnosis not present

## 2015-11-08 DIAGNOSIS — R350 Frequency of micturition: Secondary | ICD-10-CM | POA: Diagnosis not present

## 2015-11-08 DIAGNOSIS — N401 Enlarged prostate with lower urinary tract symptoms: Secondary | ICD-10-CM | POA: Diagnosis not present

## 2015-11-16 ENCOUNTER — Other Ambulatory Visit: Payer: Self-pay | Admitting: Acute Care

## 2015-11-16 DIAGNOSIS — Z87891 Personal history of nicotine dependence: Secondary | ICD-10-CM

## 2015-11-21 ENCOUNTER — Telehealth: Payer: Self-pay | Admitting: Acute Care

## 2015-11-21 ENCOUNTER — Encounter: Payer: Self-pay | Admitting: Acute Care

## 2015-11-21 ENCOUNTER — Ambulatory Visit (INDEPENDENT_AMBULATORY_CARE_PROVIDER_SITE_OTHER): Payer: Medicare Other | Admitting: Acute Care

## 2015-11-21 ENCOUNTER — Ambulatory Visit (INDEPENDENT_AMBULATORY_CARE_PROVIDER_SITE_OTHER)
Admission: RE | Admit: 2015-11-21 | Discharge: 2015-11-21 | Disposition: A | Discharge status: Home / Self Care | Payer: Medicare Other | Source: Ambulatory Visit | Attending: Acute Care | Admitting: Acute Care

## 2015-11-21 DIAGNOSIS — Z87891 Personal history of nicotine dependence: Secondary | ICD-10-CM

## 2015-11-21 IMAGING — CT CT CHEST LUNG CANCER SCREENING LOW DOSE W/O CM
2 of 4 series · 15 of 40 positions shown, 18 images · non-contrast
Comparison: None.

CLINICAL DATA: 65-year-old male former smoker, quit 4 years ago,
with 90 pack-year history of smoking, for initial lung cancer
screening

EXAM:
CT CHEST WITHOUT CONTRAST LOW-DOSE FOR LUNG CANCER SCREENING
TECHNIQUE: Multidetector CT imaging of the chest was performed following the
standard protocol without IV contrast.

[Series 2: thorax 5.0 i31f 3 · axial · 0.81mm/px · z∈[-328,-33]mm · 12 of 65 slices shown, 15 images]
[im 3/65  mediastinal]
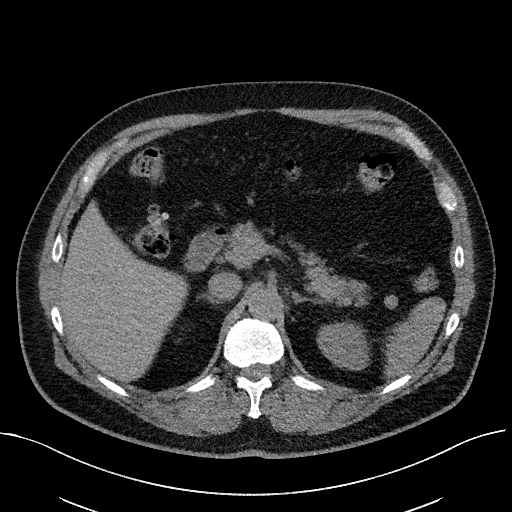
[im 3/65  lung]
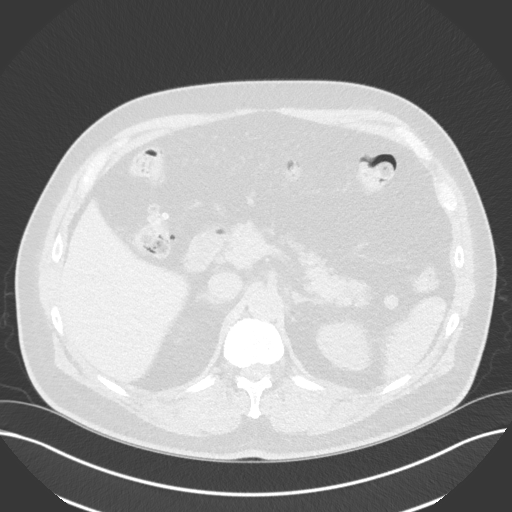
[im 9/65  lung]
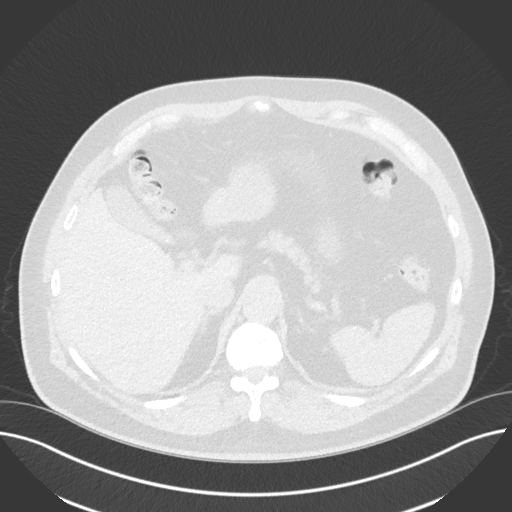
[im 14/65  lung]
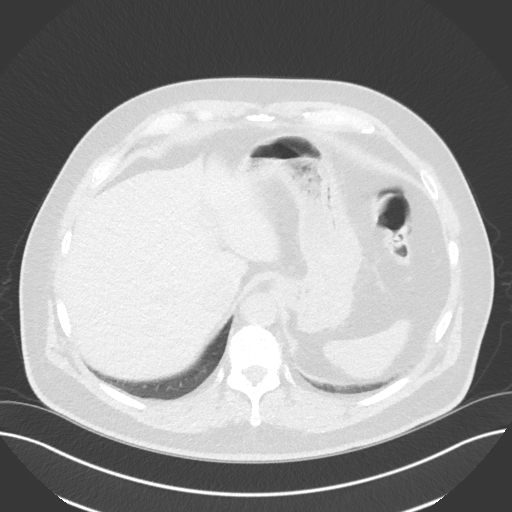
[im 20/65  lung]
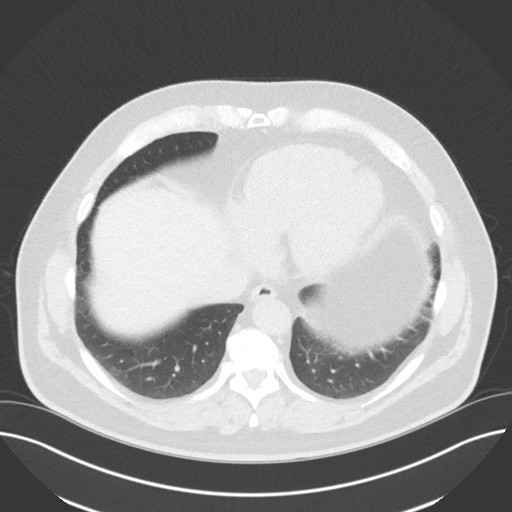
[im 26/65  mediastinal]
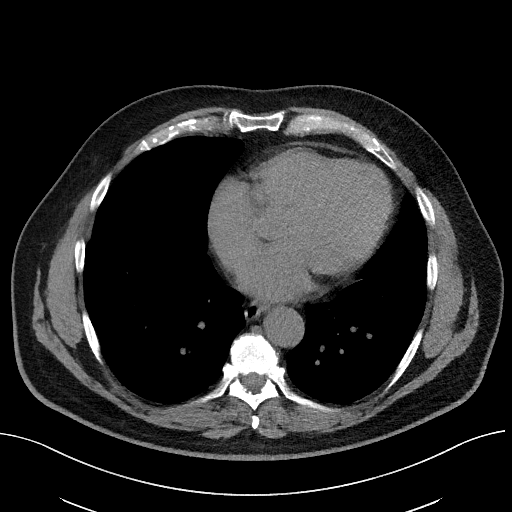
[im 26/65  lung]
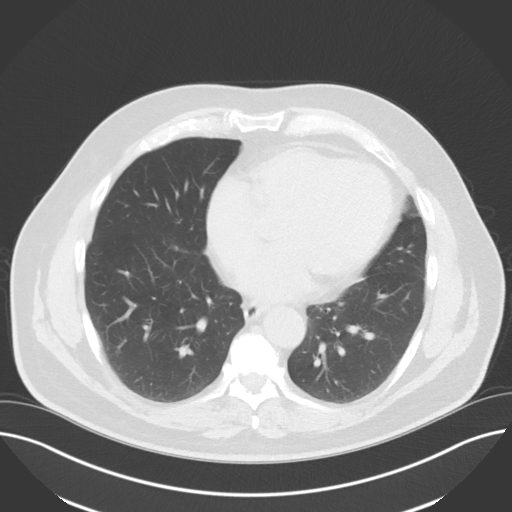
[im 31/65  lung]
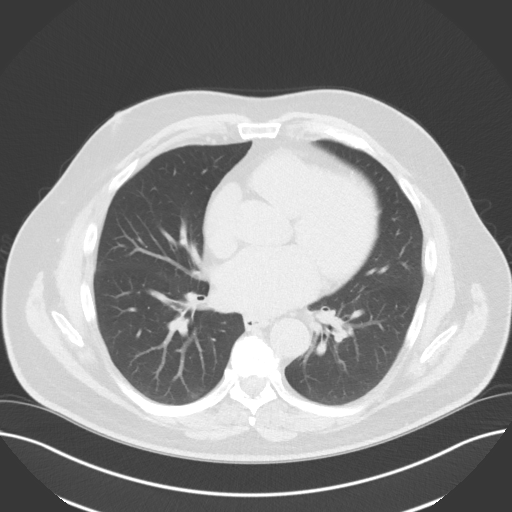
[im 34/65  lung]
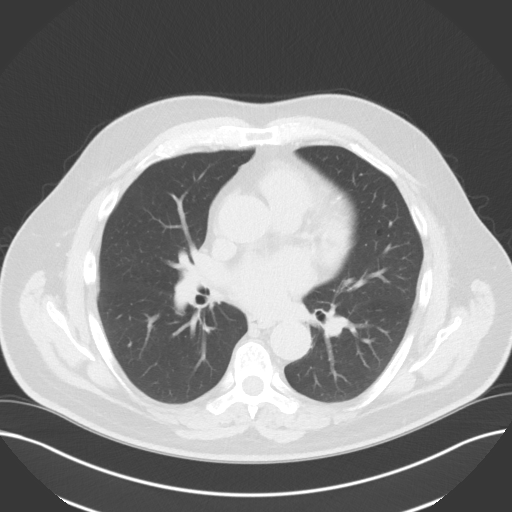
[im 39/65  lung]
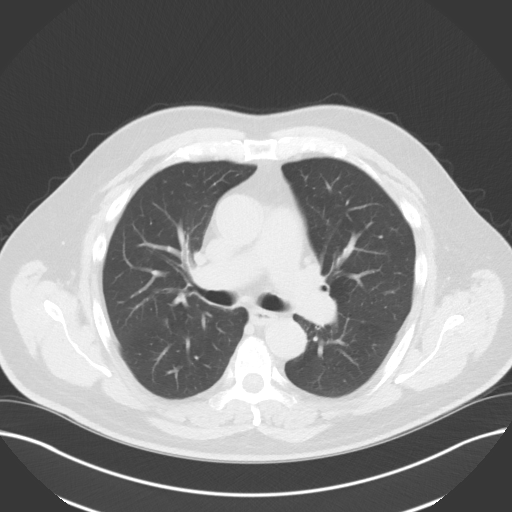
[im 45/65  mediastinal]
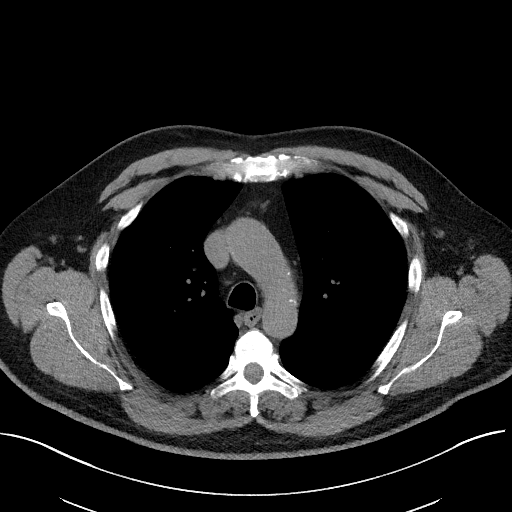
[im 45/65  lung]
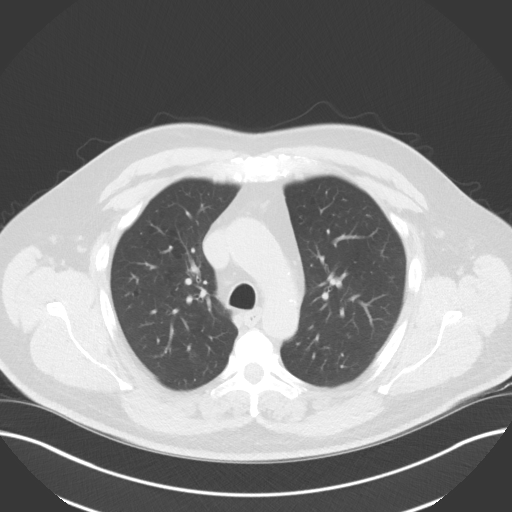
[im 51/65  lung]
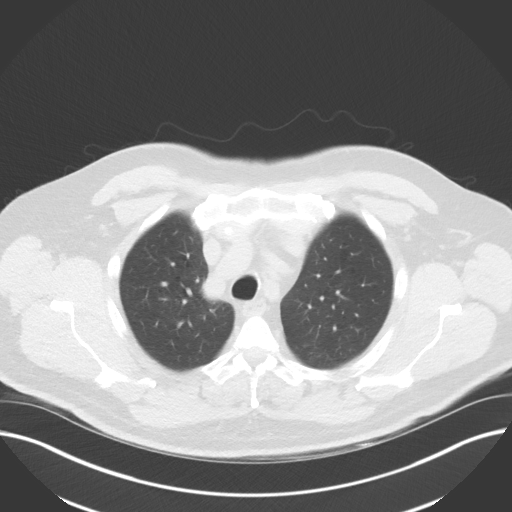
[im 56/65  lung]
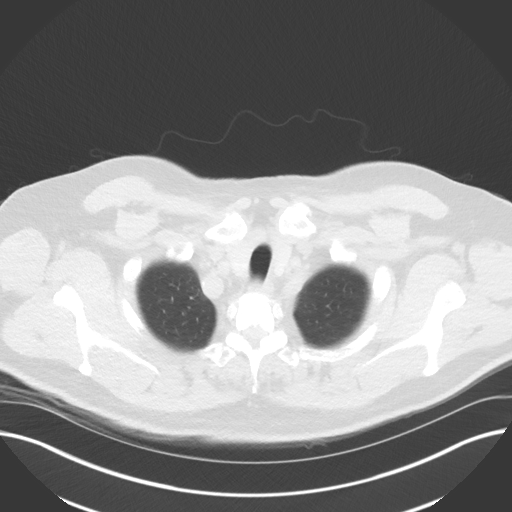
[im 62/65  lung]
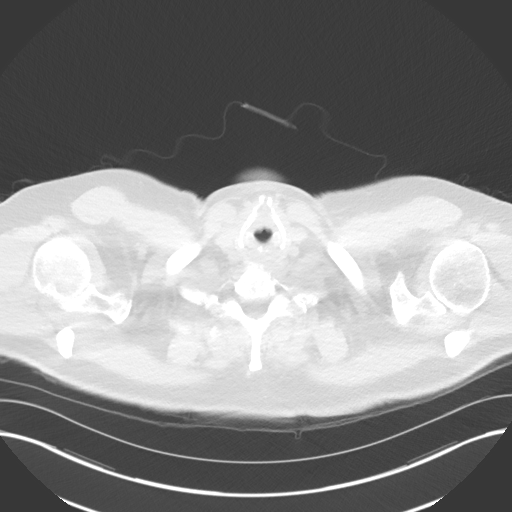

[Series 5: coronal · coronal · 0.63mm/px · 3 of 144 slices shown]
[im 29/144  lung]
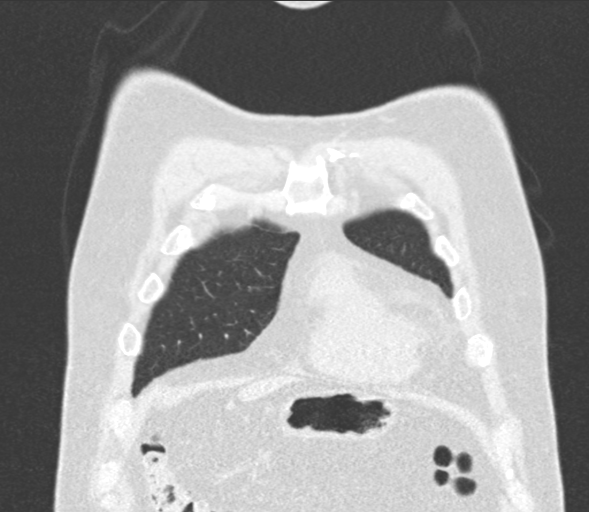
[im 58/144  lung]
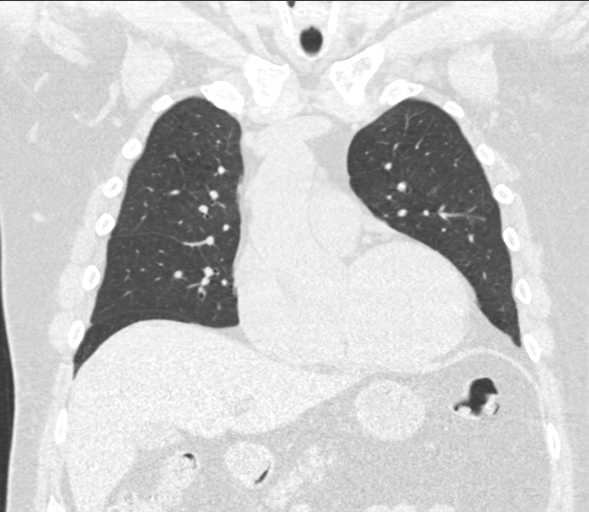
[im 86/144  lung]
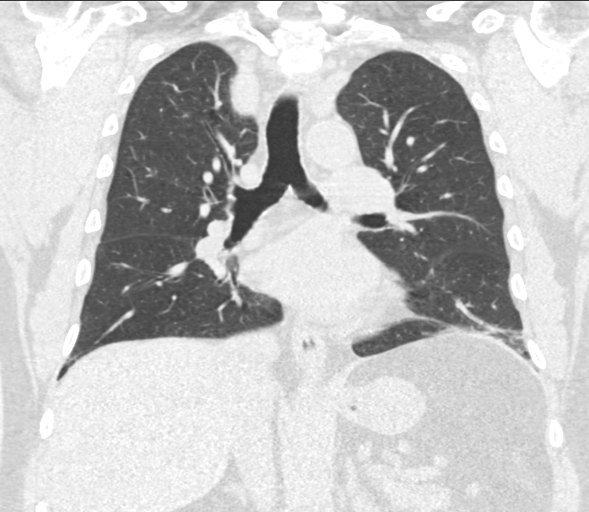

[15 of 40 positions shown; findings below may reference images not displayed]

FINDINGS: Cardiovascular: The heart is top normal in size. No pericardial
effusion.

Coronary atherosclerosis the LAD.

Mild atherosclerotic calcifications of the aortic arch.

Mediastinum/Nodes: Small mediastinal lymph nodes which do not meet
pathologic CT size criteria.

Visualized thyroid is unremarkable.

Lungs/Pleura: 4.3 mm triangular subpleural nodule along the right
minor fissure (series 3/image 162).

Mild patchy scarring/ atelectasis along the anterior aspect of the
left lung base (series 3/image 212).

No focal consolidation.

Mild emphysematous changes.

No pleural effusion or pneumothorax.

Upper Abdomen: Visualized upper abdomen is unremarkable.

Musculoskeletal: Visualized osseous structures are within normal
limits.
IMPRESSION: 4.3 mm right middle lobe pulmonary nodule. Lung-RADS Category 2,
benign appearance or behavior. Continue annual screening with
low-dose chest CT without contrast in 12 months.

## 2015-11-21 NOTE — Telephone Encounter (Signed)
I have called Mr. Alec Johnson with the results of his low-dose screening CT. I explained to him that his scan was read as a Lung RADS 2: nodules that are benign in appearance and behavior with a very low likelihood of becoming a clinically active cancer due to size or lack of growth. Recommendation per radiology is for a repeat LDCT in 12 months. I told him we will order and schedule his exam for November 2018. I explained to him that the scan also indicated mild atherosclerotic calcifications within the aortic arch. He explained to me that he is currently taking a statin medication daily. I told him that I will forward the results of the scan to his physician Dr. Katherina Mires, to allow her to follow-up as she feels is clinically indicated. [name] had no questions upon completion of the call. He has my contact information in the event he has questions in the future

## 2015-11-21 NOTE — Progress Notes (Signed)
Shared Decision Making Visit Lung Cancer Screening Program [phone])   Eligibility:  Age 65 y.o.  Pack Years Smoking History Calculation 90 pack year smoking history (# packs/per year x # years smoked)  Recent History of coughing up blood  no  Unexplained weight loss? no ( >Than 15 pounds within the last 6 months )  Prior History Lung / other cancer no (Diagnosis within the last 5 years already requiring surveillance chest CT Scans).  Smoking Status Former Smoker  Former Smokers: Years since quit: 3 years  Quit Date: 12/21/2011  Visit Components:  Discussion included one or more decision making aids. yes  Discussion included risk/benefits of screening. yes  Discussion included potential follow up diagnostic testing for abnormal scans. yes  Discussion included meaning and risk of over diagnosis. yes  Discussion included meaning and risk of False Positives. yes  Discussion included meaning of total radiation exposure. yes  Counseling Included:  Importance of adherence to annual lung cancer LDCT screening. yes  Impact of comorbidities on ability to participate in the program. yes  Ability and willingness to under diagnostic treatment. yes  Smoking Cessation Counseling:  Current Smokers:   Discussed importance of smoking cessation. NA former smoker  Information about tobacco cessation classes and interventions provided to patient. yes  Patient provided with "ticket" for LDCT Scan. yes  Symptomatic Patient. no  Counseling: Former smoker  Diagnosis Code: Tobacco Use Z72.0  Asymptomatic Patient yes  Counseling: NA, former smoker  Former Smokers:   Discussed the importance of maintaining cigarette abstinence. yes  Diagnosis Code: Personal History of Nicotine Dependence. [id number]  Information about tobacco cessation classes and interventions provided to patient. Yes  Patient provided with "ticket" for LDCT Scan. yes  Written Order for Lung Cancer Screening  with LDCT placed in Epic. Yes (CT Chest Lung Cancer Screening Low Dose W/O CM) [id number] Z12.2-Screening of respiratory organs Z87.891-Personal history of nicotine dependence   Alec Spatz, NP 11/21/2015

## 2015-11-22 DIAGNOSIS — N401 Enlarged prostate with lower urinary tract symptoms: Secondary | ICD-10-CM | POA: Diagnosis not present

## 2015-11-22 DIAGNOSIS — R350 Frequency of micturition: Secondary | ICD-10-CM | POA: Diagnosis not present

## 2015-11-22 DIAGNOSIS — N138 Other obstructive and reflux uropathy: Secondary | ICD-10-CM | POA: Diagnosis not present

## 2015-12-10 DIAGNOSIS — Z23 Encounter for immunization: Secondary | ICD-10-CM | POA: Diagnosis not present

## 2015-12-18 DIAGNOSIS — R001 Bradycardia, unspecified: Secondary | ICD-10-CM | POA: Diagnosis not present

## 2015-12-18 DIAGNOSIS — E785 Hyperlipidemia, unspecified: Secondary | ICD-10-CM | POA: Diagnosis not present

## 2015-12-18 DIAGNOSIS — Z0000 Encounter for general adult medical examination without abnormal findings: Secondary | ICD-10-CM | POA: Diagnosis not present

## 2015-12-18 DIAGNOSIS — Z1159 Encounter for screening for other viral diseases: Secondary | ICD-10-CM | POA: Diagnosis not present

## 2015-12-18 DIAGNOSIS — Z23 Encounter for immunization: Secondary | ICD-10-CM | POA: Diagnosis not present

## 2016-02-26 DIAGNOSIS — D122 Benign neoplasm of ascending colon: Secondary | ICD-10-CM | POA: Diagnosis not present

## 2016-02-26 DIAGNOSIS — D120 Benign neoplasm of cecum: Secondary | ICD-10-CM | POA: Diagnosis not present

## 2016-02-26 DIAGNOSIS — D125 Benign neoplasm of sigmoid colon: Secondary | ICD-10-CM | POA: Diagnosis not present

## 2016-02-26 DIAGNOSIS — Z86010 Personal history of colonic polyps: Secondary | ICD-10-CM | POA: Diagnosis not present

## 2016-05-13 DIAGNOSIS — M2550 Pain in unspecified joint: Secondary | ICD-10-CM | POA: Diagnosis not present

## 2016-05-13 DIAGNOSIS — M659 Synovitis and tenosynovitis, unspecified: Secondary | ICD-10-CM | POA: Diagnosis not present

## 2016-05-14 DIAGNOSIS — M180 Bilateral primary osteoarthritis of first carpometacarpal joints: Secondary | ICD-10-CM | POA: Diagnosis not present

## 2016-05-14 DIAGNOSIS — M2550 Pain in unspecified joint: Secondary | ICD-10-CM | POA: Diagnosis not present

## 2016-07-01 DIAGNOSIS — R768 Other specified abnormal immunological findings in serum: Secondary | ICD-10-CM | POA: Diagnosis not present

## 2016-07-01 DIAGNOSIS — M79641 Pain in right hand: Secondary | ICD-10-CM | POA: Diagnosis not present

## 2016-07-01 DIAGNOSIS — M79642 Pain in left hand: Secondary | ICD-10-CM | POA: Diagnosis not present

## 2016-07-01 DIAGNOSIS — M2550 Pain in unspecified joint: Secondary | ICD-10-CM | POA: Diagnosis not present

## 2016-07-01 DIAGNOSIS — E669 Obesity, unspecified: Secondary | ICD-10-CM | POA: Diagnosis not present

## 2016-07-01 DIAGNOSIS — Z6833 Body mass index (BMI) 33.0-33.9, adult: Secondary | ICD-10-CM | POA: Diagnosis not present

## 2016-07-01 DIAGNOSIS — M150 Primary generalized (osteo)arthritis: Secondary | ICD-10-CM | POA: Diagnosis not present

## 2016-07-02 DIAGNOSIS — H2513 Age-related nuclear cataract, bilateral: Secondary | ICD-10-CM | POA: Diagnosis not present

## 2016-07-02 DIAGNOSIS — H52223 Regular astigmatism, bilateral: Secondary | ICD-10-CM | POA: Diagnosis not present

## 2016-07-02 DIAGNOSIS — H04123 Dry eye syndrome of bilateral lacrimal glands: Secondary | ICD-10-CM | POA: Diagnosis not present

## 2016-07-02 DIAGNOSIS — H524 Presbyopia: Secondary | ICD-10-CM | POA: Diagnosis not present

## 2016-07-02 DIAGNOSIS — H348322 Tributary (branch) retinal vein occlusion, left eye, stable: Secondary | ICD-10-CM | POA: Diagnosis not present

## 2016-07-07 DIAGNOSIS — H35072 Retinal telangiectasis, left eye: Secondary | ICD-10-CM | POA: Diagnosis not present

## 2016-07-07 DIAGNOSIS — H35033 Hypertensive retinopathy, bilateral: Secondary | ICD-10-CM | POA: Diagnosis not present

## 2016-07-07 DIAGNOSIS — H3562 Retinal hemorrhage, left eye: Secondary | ICD-10-CM | POA: Diagnosis not present

## 2016-07-07 DIAGNOSIS — H25813 Combined forms of age-related cataract, bilateral: Secondary | ICD-10-CM | POA: Diagnosis not present

## 2016-07-07 DIAGNOSIS — H34832 Tributary (branch) retinal vein occlusion, left eye, with macular edema: Secondary | ICD-10-CM | POA: Diagnosis not present

## 2016-07-15 DIAGNOSIS — H34832 Tributary (branch) retinal vein occlusion, left eye, with macular edema: Secondary | ICD-10-CM | POA: Diagnosis not present

## 2016-07-25 DIAGNOSIS — R768 Other specified abnormal immunological findings in serum: Secondary | ICD-10-CM | POA: Diagnosis not present

## 2016-07-25 DIAGNOSIS — Z6833 Body mass index (BMI) 33.0-33.9, adult: Secondary | ICD-10-CM | POA: Diagnosis not present

## 2016-07-25 DIAGNOSIS — Z1589 Genetic susceptibility to other disease: Secondary | ICD-10-CM | POA: Diagnosis not present

## 2016-07-25 DIAGNOSIS — M150 Primary generalized (osteo)arthritis: Secondary | ICD-10-CM | POA: Diagnosis not present

## 2016-07-25 DIAGNOSIS — E669 Obesity, unspecified: Secondary | ICD-10-CM | POA: Diagnosis not present

## 2016-07-25 DIAGNOSIS — M2550 Pain in unspecified joint: Secondary | ICD-10-CM | POA: Diagnosis not present

## 2016-07-28 DIAGNOSIS — E291 Testicular hypofunction: Secondary | ICD-10-CM | POA: Diagnosis not present

## 2016-07-28 DIAGNOSIS — N401 Enlarged prostate with lower urinary tract symptoms: Secondary | ICD-10-CM | POA: Diagnosis not present

## 2016-07-28 DIAGNOSIS — R300 Dysuria: Secondary | ICD-10-CM | POA: Diagnosis not present

## 2016-07-28 DIAGNOSIS — N138 Other obstructive and reflux uropathy: Secondary | ICD-10-CM | POA: Diagnosis not present

## 2016-07-28 DIAGNOSIS — T387X5D Adverse effect of androgens and anabolic congeners, subsequent encounter: Secondary | ICD-10-CM | POA: Diagnosis not present

## 2016-07-28 DIAGNOSIS — R358 Other polyuria: Secondary | ICD-10-CM | POA: Diagnosis not present

## 2016-08-05 DIAGNOSIS — N359 Urethral stricture, unspecified: Secondary | ICD-10-CM | POA: Diagnosis not present

## 2016-08-19 DIAGNOSIS — H35072 Retinal telangiectasis, left eye: Secondary | ICD-10-CM | POA: Diagnosis not present

## 2016-08-19 DIAGNOSIS — H34832 Tributary (branch) retinal vein occlusion, left eye, with macular edema: Secondary | ICD-10-CM | POA: Diagnosis not present

## 2016-08-19 DIAGNOSIS — H3562 Retinal hemorrhage, left eye: Secondary | ICD-10-CM | POA: Diagnosis not present

## 2016-08-29 DIAGNOSIS — N359 Urethral stricture, unspecified: Secondary | ICD-10-CM | POA: Diagnosis not present

## 2016-08-29 DIAGNOSIS — E291 Testicular hypofunction: Secondary | ICD-10-CM | POA: Diagnosis not present

## 2016-09-24 DIAGNOSIS — H34832 Tributary (branch) retinal vein occlusion, left eye, with macular edema: Secondary | ICD-10-CM | POA: Diagnosis not present

## 2016-10-03 DIAGNOSIS — R339 Retention of urine, unspecified: Secondary | ICD-10-CM | POA: Diagnosis not present

## 2016-10-03 DIAGNOSIS — R358 Other polyuria: Secondary | ICD-10-CM | POA: Diagnosis not present

## 2016-10-03 DIAGNOSIS — T387X5D Adverse effect of androgens and anabolic congeners, subsequent encounter: Secondary | ICD-10-CM | POA: Diagnosis not present

## 2016-10-03 DIAGNOSIS — N138 Other obstructive and reflux uropathy: Secondary | ICD-10-CM | POA: Diagnosis not present

## 2016-10-03 DIAGNOSIS — N401 Enlarged prostate with lower urinary tract symptoms: Secondary | ICD-10-CM | POA: Diagnosis not present

## 2016-10-03 DIAGNOSIS — E291 Testicular hypofunction: Secondary | ICD-10-CM | POA: Diagnosis not present

## 2016-10-08 DIAGNOSIS — H35072 Retinal telangiectasis, left eye: Secondary | ICD-10-CM | POA: Diagnosis not present

## 2016-10-08 DIAGNOSIS — H25813 Combined forms of age-related cataract, bilateral: Secondary | ICD-10-CM | POA: Diagnosis not present

## 2016-10-08 DIAGNOSIS — H3562 Retinal hemorrhage, left eye: Secondary | ICD-10-CM | POA: Diagnosis not present

## 2016-10-08 DIAGNOSIS — H35033 Hypertensive retinopathy, bilateral: Secondary | ICD-10-CM | POA: Diagnosis not present

## 2016-10-08 DIAGNOSIS — H34832 Tributary (branch) retinal vein occlusion, left eye, with macular edema: Secondary | ICD-10-CM | POA: Diagnosis not present

## 2016-10-27 DIAGNOSIS — Z23 Encounter for immunization: Secondary | ICD-10-CM | POA: Diagnosis not present

## 2016-10-30 DIAGNOSIS — H34832 Tributary (branch) retinal vein occlusion, left eye, with macular edema: Secondary | ICD-10-CM | POA: Diagnosis not present

## 2016-11-07 DIAGNOSIS — N401 Enlarged prostate with lower urinary tract symptoms: Secondary | ICD-10-CM | POA: Diagnosis not present

## 2016-11-07 DIAGNOSIS — N138 Other obstructive and reflux uropathy: Secondary | ICD-10-CM | POA: Diagnosis not present

## 2016-11-21 ENCOUNTER — Ambulatory Visit (HOSPITAL_BASED_OUTPATIENT_CLINIC_OR_DEPARTMENT_OTHER): Payer: Medicare Other

## 2016-11-24 ENCOUNTER — Ambulatory Visit (HOSPITAL_BASED_OUTPATIENT_CLINIC_OR_DEPARTMENT_OTHER)
Admission: RE | Admit: 2016-11-24 | Discharge: 2016-11-24 | Disposition: A | Discharge status: Home / Self Care | Payer: Medicare Other | Source: Ambulatory Visit | Attending: Acute Care | Admitting: Acute Care

## 2016-11-24 DIAGNOSIS — Z122 Encounter for screening for malignant neoplasm of respiratory organs: Secondary | ICD-10-CM | POA: Insufficient documentation

## 2016-11-24 DIAGNOSIS — J439 Emphysema, unspecified: Secondary | ICD-10-CM | POA: Insufficient documentation

## 2016-11-24 DIAGNOSIS — I700 Atherosclerosis of aorta: Secondary | ICD-10-CM | POA: Insufficient documentation

## 2016-11-24 DIAGNOSIS — Z87891 Personal history of nicotine dependence: Secondary | ICD-10-CM | POA: Insufficient documentation

## 2016-11-24 IMAGING — CT CT CHEST LUNG CANCER SCREENING LOW DOSE W/O CM
2 of 4 series · 15 of 40 positions shown, 18 images · non-contrast
Comparison: [DATE]

CLINICAL DATA: Ninety-two pack-year history. Former asymptomatic
smoker

EXAM:
CT CHEST WITHOUT CONTRAST LOW-DOSE FOR LUNG CANCER SCREENING
TECHNIQUE: Multidetector CT imaging of the chest was performed following the
standard protocol without IV contrast.

[Series 2: axial st · axial · 0.72mm/px · z∈[-293,-23]mm · 12 of 60 slices shown, 15 images]
[im 3/60  mediastinal]
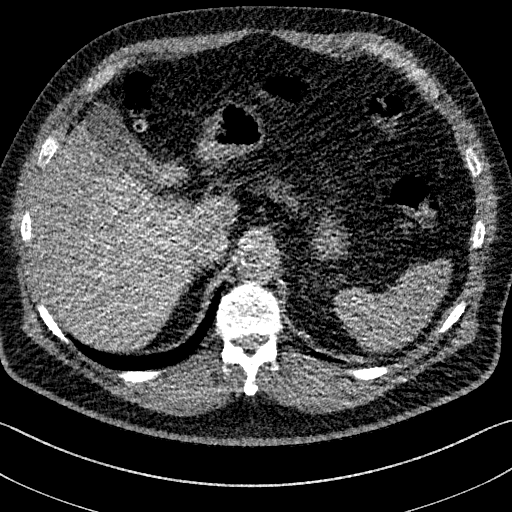
[im 3/60  lung]
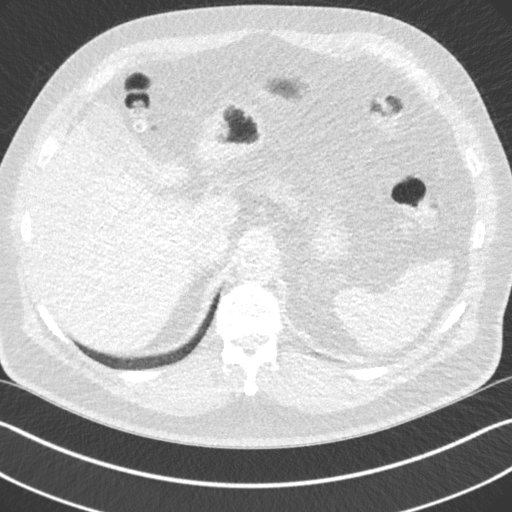
[im 8/60  lung]
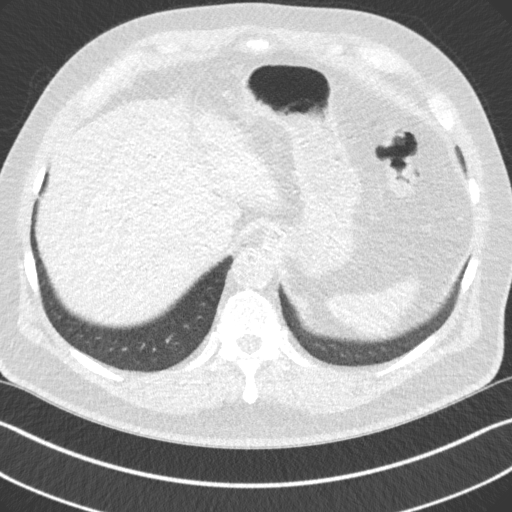
[im 13/60  lung]
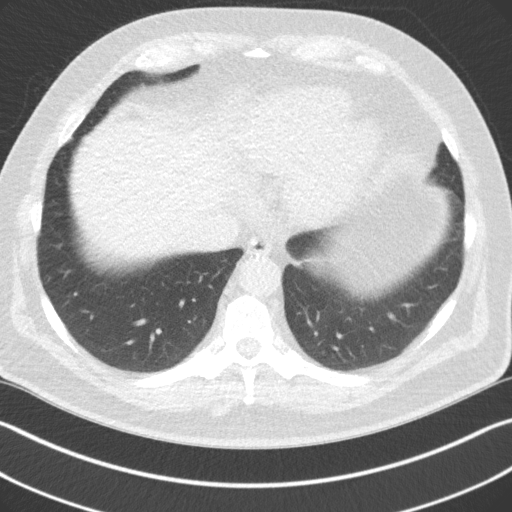
[im 18/60  lung]
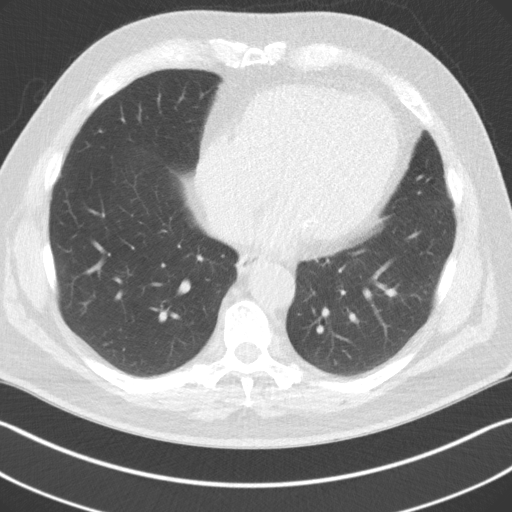
[im 23/60  mediastinal]
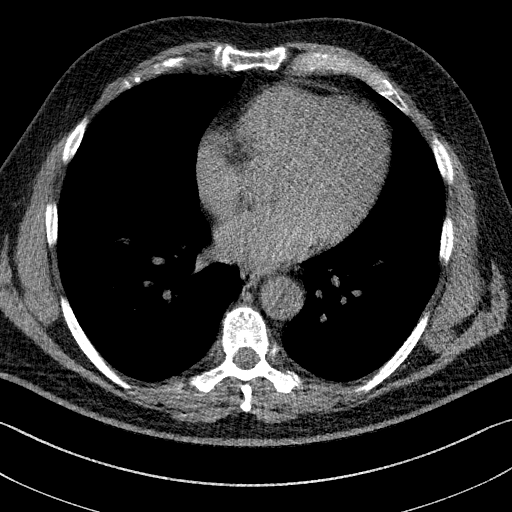
[im 23/60  lung]
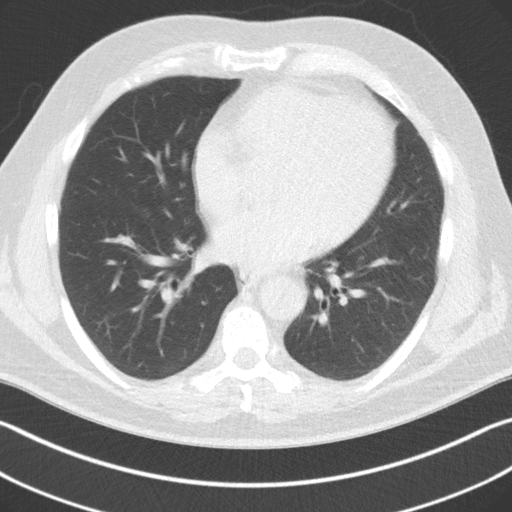
[im 28/60  lung]
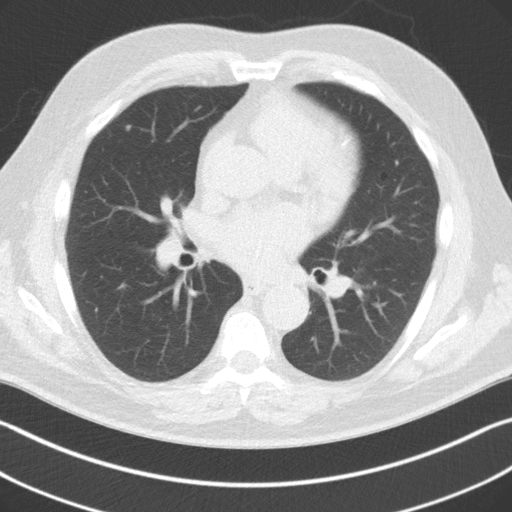
[im 32/60  lung]
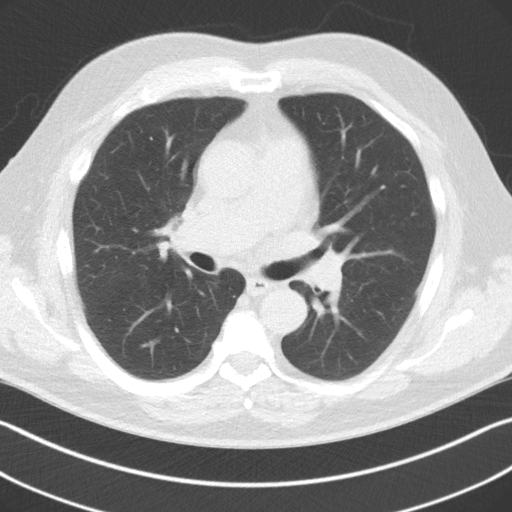
[im 37/60  lung]
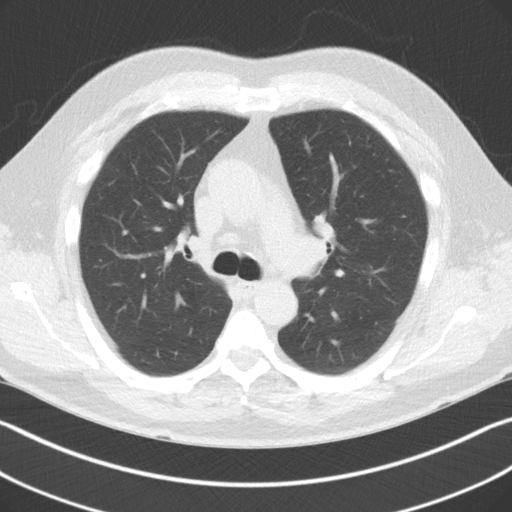
[im 42/60  mediastinal]
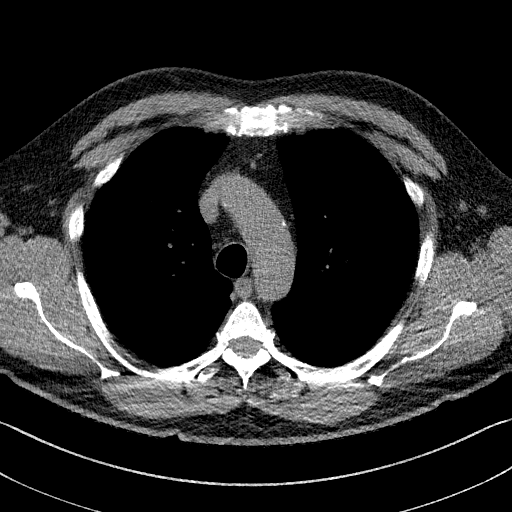
[im 42/60  lung]
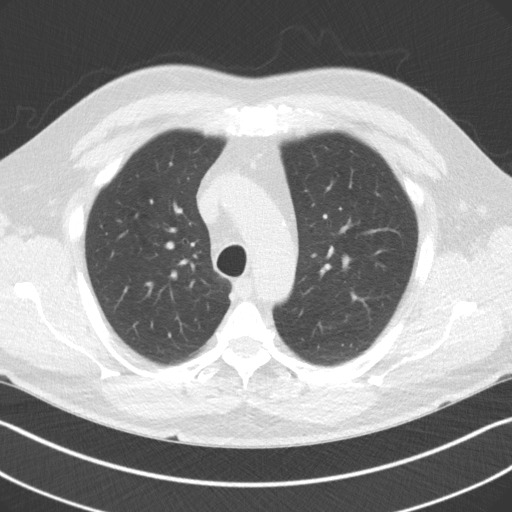
[im 47/60  lung]
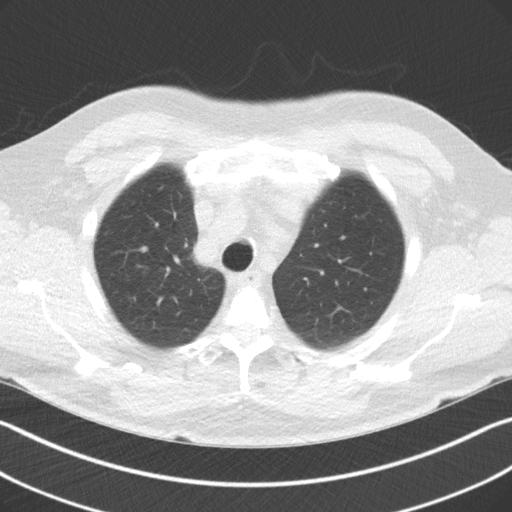
[im 52/60  lung]
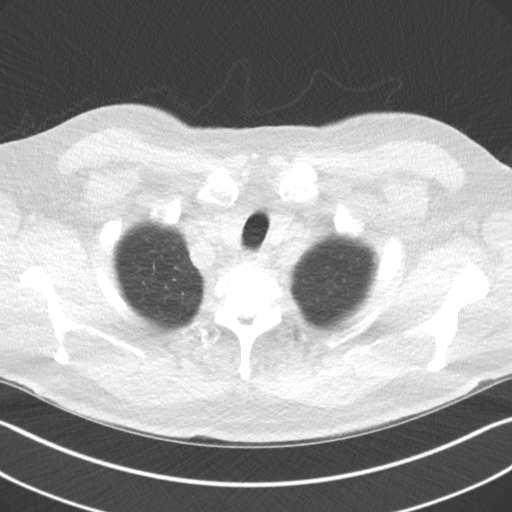
[im 57/60  lung]
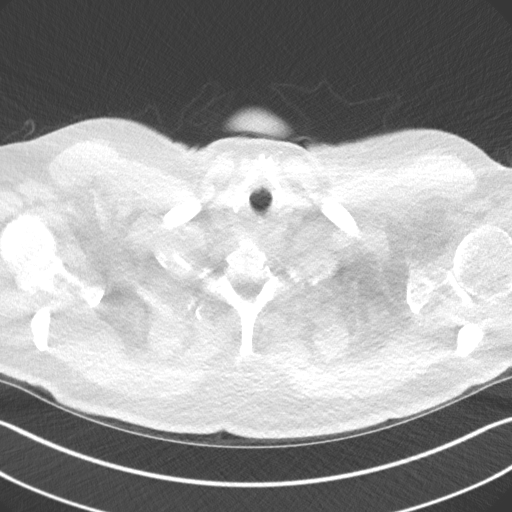

[Series 5: coronal · coronal · 0.61mm/px · 3 of 323 slices shown]
[im 65/323  lung]
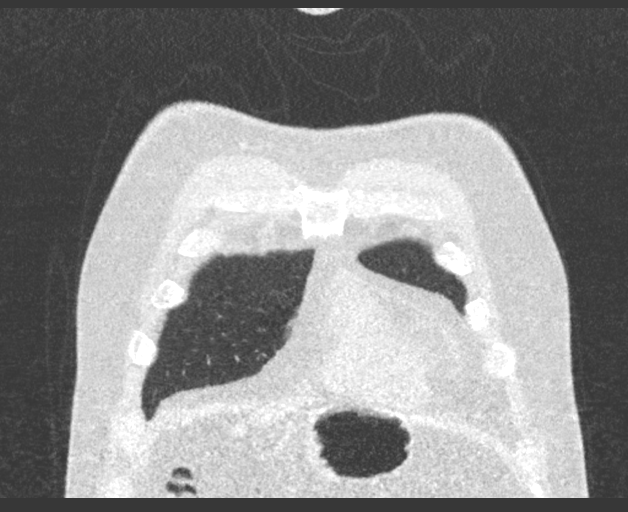
[im 129/323  lung]
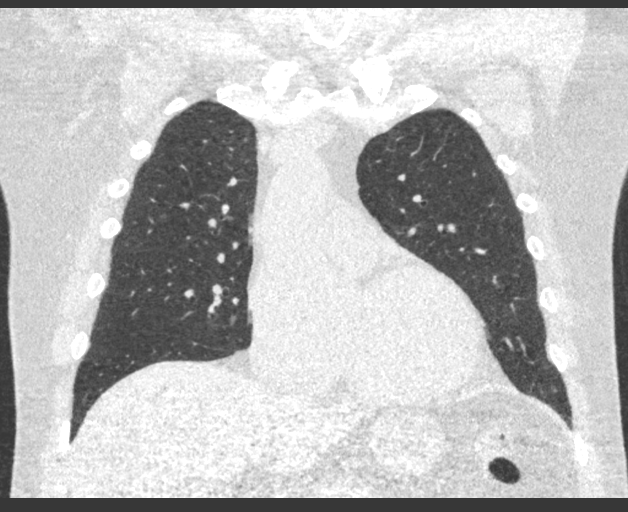
[im 194/323  lung]
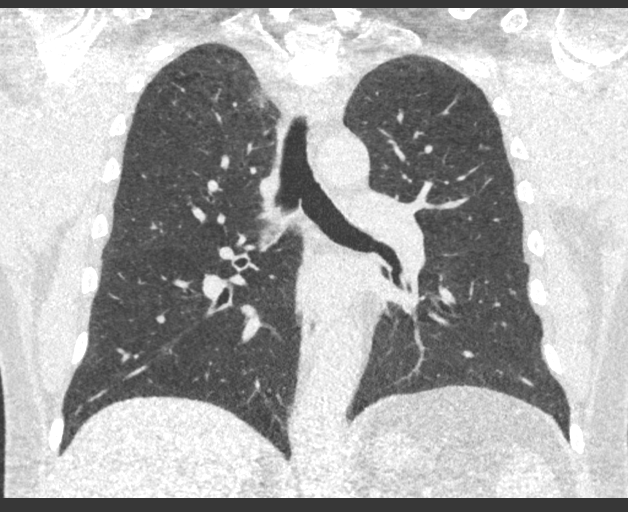

[15 of 40 positions shown; findings below may reference images not displayed]

FINDINGS: Cardiovascular: Normal heart size. Aortic atherosclerosis. No
pericardial effusion. Calcification within the LAD coronary artery
identified.

Mediastinum/Nodes: No enlarged mediastinal, hilar, or axillary lymph
nodes. Thyroid gland, trachea, and esophagus demonstrate no
significant findings.

Lungs/Pleura: Lungs are clear. Moderate changes of centrilobular
emphysema. No pleural effusion or pneumothorax. Small pulmonary
nodule within the anterior right middle lobe has an equivalent
diameter of 4.7 mm and is unchanged from previous exam. No new
nodules.

Upper Abdomen: No acute abnormality.

Musculoskeletal: No chest wall mass or suspicious bone lesions
identified.
IMPRESSION: 1. Lung-RADS 2, benign appearance or behavior. Continue annual
screening with low-dose chest CT without contrast in 12 months.
2. Aortic Atherosclerosis ([8S]-[8S]) and Emphysema ([8S]-[8S]).

## 2016-11-25 ENCOUNTER — Other Ambulatory Visit: Payer: Self-pay | Admitting: Acute Care

## 2016-11-25 DIAGNOSIS — M2550 Pain in unspecified joint: Secondary | ICD-10-CM | POA: Diagnosis not present

## 2016-11-25 DIAGNOSIS — E669 Obesity, unspecified: Secondary | ICD-10-CM | POA: Diagnosis not present

## 2016-11-25 DIAGNOSIS — R768 Other specified abnormal immunological findings in serum: Secondary | ICD-10-CM | POA: Diagnosis not present

## 2016-11-25 DIAGNOSIS — Z122 Encounter for screening for malignant neoplasm of respiratory organs: Secondary | ICD-10-CM

## 2016-11-25 DIAGNOSIS — E291 Testicular hypofunction: Secondary | ICD-10-CM | POA: Diagnosis not present

## 2016-11-25 DIAGNOSIS — M150 Primary generalized (osteo)arthritis: Secondary | ICD-10-CM | POA: Diagnosis not present

## 2016-11-25 DIAGNOSIS — Z87891 Personal history of nicotine dependence: Secondary | ICD-10-CM

## 2016-11-25 DIAGNOSIS — Z1589 Genetic susceptibility to other disease: Secondary | ICD-10-CM | POA: Diagnosis not present

## 2016-11-25 DIAGNOSIS — Z6834 Body mass index (BMI) 34.0-34.9, adult: Secondary | ICD-10-CM | POA: Diagnosis not present

## 2016-12-15 DIAGNOSIS — H34832 Tributary (branch) retinal vein occlusion, left eye, with macular edema: Secondary | ICD-10-CM | POA: Diagnosis not present

## 2016-12-19 DIAGNOSIS — N401 Enlarged prostate with lower urinary tract symptoms: Secondary | ICD-10-CM | POA: Diagnosis not present

## 2016-12-19 DIAGNOSIS — N138 Other obstructive and reflux uropathy: Secondary | ICD-10-CM | POA: Diagnosis not present

## 2016-12-31 DIAGNOSIS — H3562 Retinal hemorrhage, left eye: Secondary | ICD-10-CM | POA: Diagnosis not present

## 2016-12-31 DIAGNOSIS — H35033 Hypertensive retinopathy, bilateral: Secondary | ICD-10-CM | POA: Diagnosis not present

## 2016-12-31 DIAGNOSIS — H34832 Tributary (branch) retinal vein occlusion, left eye, with macular edema: Secondary | ICD-10-CM | POA: Diagnosis not present

## 2016-12-31 DIAGNOSIS — H25813 Combined forms of age-related cataract, bilateral: Secondary | ICD-10-CM | POA: Diagnosis not present

## 2016-12-31 DIAGNOSIS — H35072 Retinal telangiectasis, left eye: Secondary | ICD-10-CM | POA: Diagnosis not present

## 2017-10-19 ENCOUNTER — Inpatient Hospital Stay (HOSPITAL_COMMUNITY): Payer: Medicare Other

## 2017-10-19 ENCOUNTER — Encounter (HOSPITAL_COMMUNITY)
Admission: EM | Disposition: A | Discharge status: Home / Self Care | Payer: Self-pay | Source: Home / Self Care | Attending: Interventional Cardiology

## 2017-10-19 ENCOUNTER — Inpatient Hospital Stay (HOSPITAL_BASED_OUTPATIENT_CLINIC_OR_DEPARTMENT_OTHER)
Admission: EM | Admit: 2017-10-19 | Discharge: 2017-10-21 | DRG: 246 | Disposition: A | Discharge status: Home / Self Care | Payer: Medicare Other | Attending: Interventional Cardiology | Admitting: Interventional Cardiology

## 2017-10-19 ENCOUNTER — Emergency Department (HOSPITAL_BASED_OUTPATIENT_CLINIC_OR_DEPARTMENT_OTHER): Payer: Medicare Other

## 2017-10-19 ENCOUNTER — Ambulatory Visit (HOSPITAL_COMMUNITY): Admit: 2017-10-19 | Payer: No Typology Code available for payment source | Admitting: Interventional Cardiology

## 2017-10-19 ENCOUNTER — Encounter (HOSPITAL_BASED_OUTPATIENT_CLINIC_OR_DEPARTMENT_OTHER): Payer: Self-pay

## 2017-10-19 DIAGNOSIS — E782 Mixed hyperlipidemia: Secondary | ICD-10-CM | POA: Diagnosis not present

## 2017-10-19 DIAGNOSIS — I213 ST elevation (STEMI) myocardial infarction of unspecified site: Secondary | ICD-10-CM

## 2017-10-19 DIAGNOSIS — I119 Hypertensive heart disease without heart failure: Secondary | ICD-10-CM | POA: Diagnosis not present

## 2017-10-19 DIAGNOSIS — Z955 Presence of coronary angioplasty implant and graft: Secondary | ICD-10-CM

## 2017-10-19 DIAGNOSIS — R0789 Other chest pain: Secondary | ICD-10-CM | POA: Diagnosis present

## 2017-10-19 DIAGNOSIS — Z79899 Other long term (current) drug therapy: Secondary | ICD-10-CM

## 2017-10-19 DIAGNOSIS — Z87891 Personal history of nicotine dependence: Secondary | ICD-10-CM

## 2017-10-19 DIAGNOSIS — I2129 ST elevation (STEMI) myocardial infarction involving other sites: Secondary | ICD-10-CM

## 2017-10-19 DIAGNOSIS — I2119 ST elevation (STEMI) myocardial infarction involving other coronary artery of inferior wall: Secondary | ICD-10-CM | POA: Diagnosis present

## 2017-10-19 DIAGNOSIS — E785 Hyperlipidemia, unspecified: Secondary | ICD-10-CM | POA: Diagnosis present

## 2017-10-19 DIAGNOSIS — Z8249 Family history of ischemic heart disease and other diseases of the circulatory system: Secondary | ICD-10-CM

## 2017-10-19 DIAGNOSIS — R001 Bradycardia, unspecified: Secondary | ICD-10-CM | POA: Diagnosis present

## 2017-10-19 DIAGNOSIS — I252 Old myocardial infarction: Secondary | ICD-10-CM

## 2017-10-19 DIAGNOSIS — I10 Essential (primary) hypertension: Secondary | ICD-10-CM | POA: Diagnosis present

## 2017-10-19 DIAGNOSIS — Z96651 Presence of right artificial knee joint: Secondary | ICD-10-CM | POA: Diagnosis present

## 2017-10-19 DIAGNOSIS — I469 Cardiac arrest, cause unspecified: Secondary | ICD-10-CM | POA: Diagnosis present

## 2017-10-19 DIAGNOSIS — K219 Gastro-esophageal reflux disease without esophagitis: Secondary | ICD-10-CM | POA: Diagnosis present

## 2017-10-19 DIAGNOSIS — I2510 Atherosclerotic heart disease of native coronary artery without angina pectoris: Secondary | ICD-10-CM | POA: Diagnosis present

## 2017-10-19 HISTORY — PX: LEFT HEART CATH AND CORONARY ANGIOGRAPHY: CATH118249

## 2017-10-19 HISTORY — PX: CORONARY/GRAFT ACUTE MI REVASCULARIZATION: CATH118305

## 2017-10-19 HISTORY — DX: ST elevation (STEMI) myocardial infarction of unspecified site: I21.3

## 2017-10-19 LAB — BASIC METABOLIC PANEL
ANION GAP: 11 (ref 5–15)
BUN: 17 mg/dL (ref 8–23)
CO2: 26 mmol/L (ref 22–32)
Calcium: 9.3 mg/dL (ref 8.9–10.3)
Chloride: 103 mmol/L (ref 98–111)
Creatinine, Ser: 0.96 mg/dL (ref 0.61–1.24)
GFR calc non Af Amer: >60 mL/min (ref >60)
Glucose, Bld: 135 mg/dL — ABNORMAL HIGH (ref 70–99)
POTASSIUM: 4 mmol/L (ref 3.5–5.1)
Sodium: 140 mmol/L (ref 135–145)

## 2017-10-19 LAB — TROPONIN I
Troponin I: 1.01 ng/mL (ref <0.03)
Troponin I: 7.22 ng/mL
Troponin I: 12.61 ng/mL (ref <0.03)

## 2017-10-19 LAB — CBC
HCT: 44.2 % (ref 39.0–52.0)
HEMOGLOBIN: 15 g/dL (ref 13.0–17.0)
MCH: 31.8 pg (ref 26.0–34.0)
MCHC: 33.9 g/dL (ref 30.0–36.0)
MCV: 93.6 fL (ref 78.0–100.0)
Platelets: 192 10*3/uL (ref 150–400)
RBC: 4.72 MIL/uL (ref 4.22–5.81)
RDW: 12.7 % (ref 11.5–15.5)
WBC: 7.6 10*3/uL (ref 4.0–10.5)

## 2017-10-19 LAB — MRSA PCR SCREENING: MRSA by PCR: NEGATIVE

## 2017-10-19 LAB — POCT ACTIVATED CLOTTING TIME: Activated Clotting Time: 318 seconds

## 2017-10-19 LAB — T4, FREE: Free T4: 0.82 ng/dL (ref 0.82–1.77)

## 2017-10-19 LAB — MAGNESIUM: Magnesium: 2 mg/dL (ref 1.7–2.4)

## 2017-10-19 LAB — TSH: TSH: 0.633 u[IU]/mL (ref 0.350–4.500)

## 2017-10-19 IMAGING — DX DG CHEST 1V PORT
1 series · 1 of 1 positions shown · non-contrast
Comparison: CT scan of the chest [DATE] for lung
cancer screening.

CLINICAL DATA: Right-sided chest pain and shortness of breath.
History of MI with revascularization on [DATE].

EXAM:
PORTABLE CHEST 1 VIEW

[chest ap]
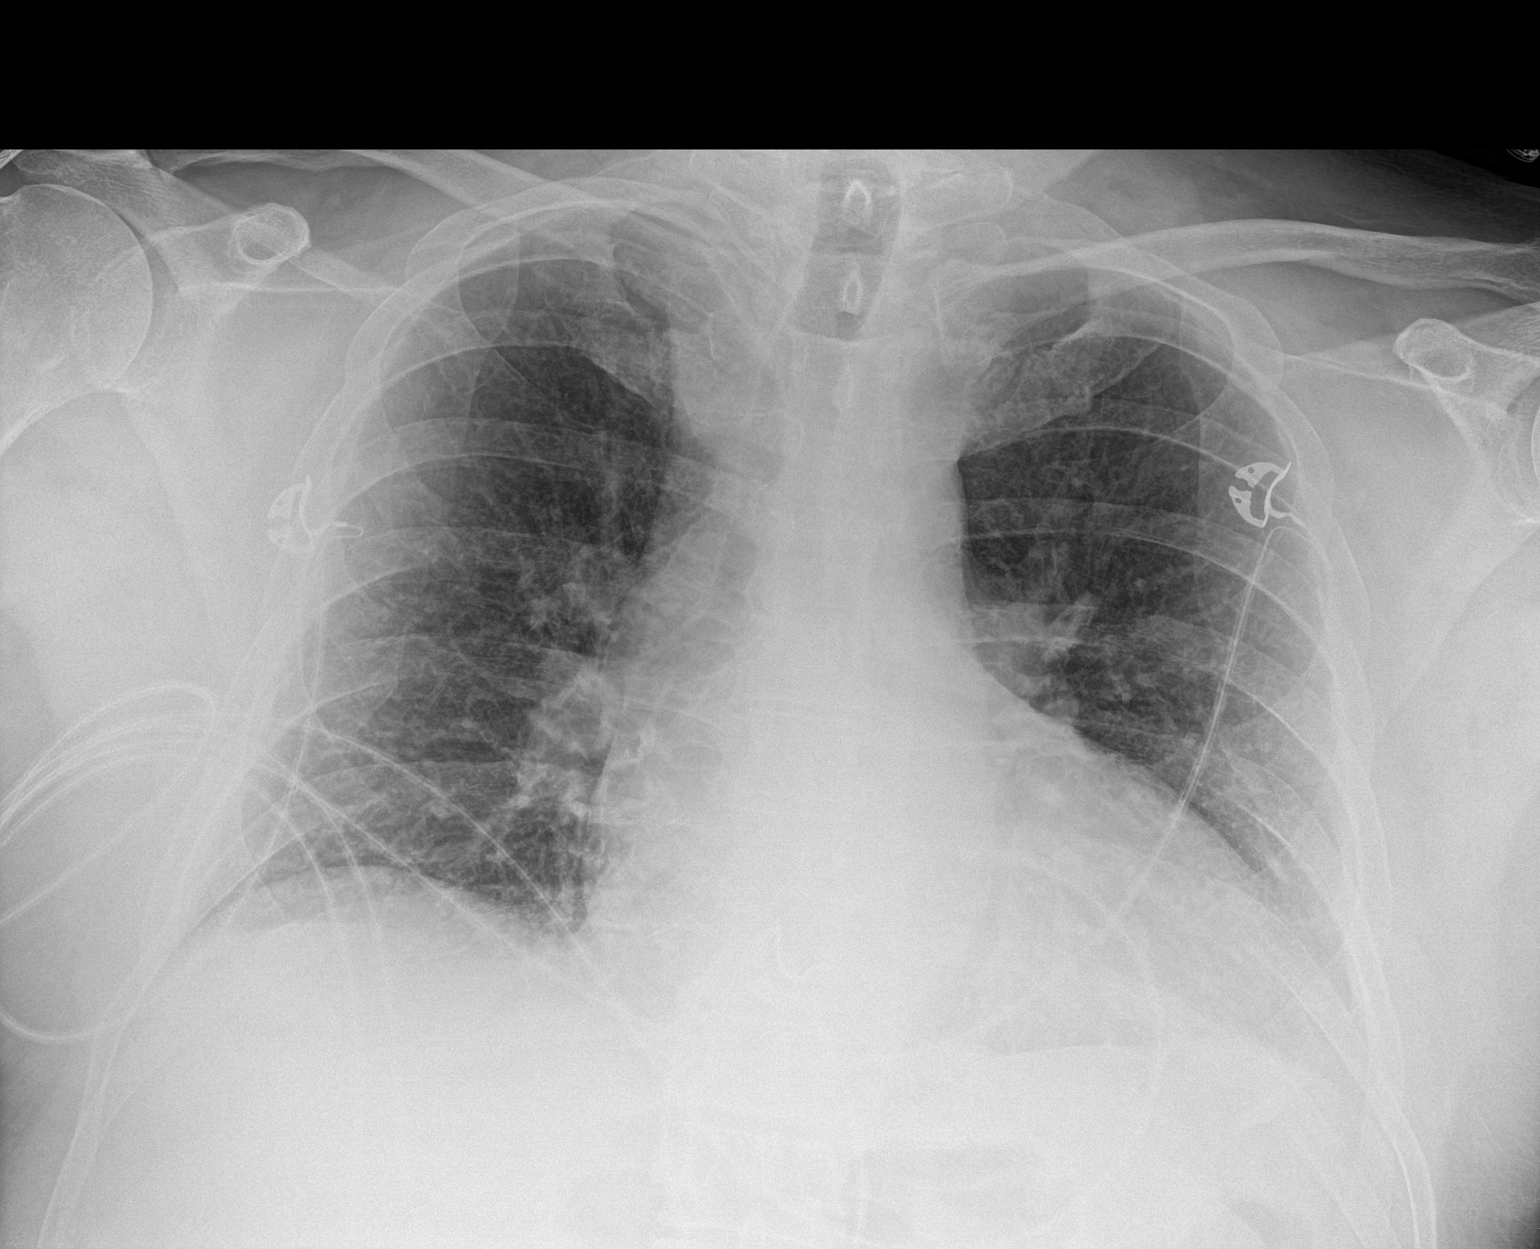

[1 of 1 positions shown; findings below may reference images not displayed]

FINDINGS: The lungs are adequately inflated. The interstitial markings are
coarse. The cardiac silhouette is enlarged. The pulmonary
vascularity is mildly prominent centrally. The mediastinum is normal
in width. There is no pleural effusion.
IMPRESSION: Mild interstitial edema. Cardiomegaly with mild central pulmonary
vascular congestion.

## 2017-10-19 SURGERY — LEFT HEART CATH AND CORONARY ANGIOGRAPHY
Anesthesia: LOCAL

## 2017-10-19 MED ORDER — HEPARIN SODIUM (PORCINE) 5000 UNIT/ML IJ SOLN
60.0000 [IU]/kg | Freq: Once | INTRAMUSCULAR | Status: AC
  Administered 2017-10-19: 12:00:00 via INTRAVENOUS
  Filled 2017-10-19: qty 4

## 2017-10-19 MED ORDER — ACETAMINOPHEN 325 MG PO TABS
650.0000 mg | ORAL_TABLET | ORAL | Status: DC | PRN
  Filled 2017-10-19: qty 2

## 2017-10-19 MED ORDER — HYDRALAZINE HCL 20 MG/ML IJ SOLN: 5.0000 mg | INTRAMUSCULAR | Status: AC | PRN

## 2017-10-19 MED ORDER — OXYMETAZOLINE HCL 0.05 % NA SOLN
1.0000 | Freq: Two times a day (BID) | NASAL | Status: DC | PRN
  Filled 2017-10-19: qty 15

## 2017-10-19 MED ORDER — MORPHINE SULFATE (PF) 4 MG/ML IV SOLN
INTRAVENOUS | Status: AC
  Filled 2017-10-19: qty 1

## 2017-10-19 MED ORDER — TICAGRELOR 90 MG PO TABS
ORAL_TABLET | ORAL | Status: DC | PRN
  Administered 2017-10-19: 180 mg via ORAL

## 2017-10-19 MED ORDER — NITROGLYCERIN 1 MG/10 ML FOR IR/CATH LAB
INTRA_ARTERIAL | Status: AC
  Filled 2017-10-19: qty 10

## 2017-10-19 MED ORDER — FENTANYL CITRATE (PF) 100 MCG/2ML IJ SOLN
INTRAMUSCULAR | Status: AC
  Filled 2017-10-19: qty 2

## 2017-10-19 MED ORDER — ACETAMINOPHEN 325 MG PO TABS
650.0000 mg | ORAL_TABLET | ORAL | Status: DC | PRN
  Administered 2017-10-19: 650 mg via ORAL

## 2017-10-19 MED ORDER — NITROGLYCERIN 0.4 MG SL SUBL: 0.4000 mg | SUBLINGUAL_TABLET | SUBLINGUAL | Status: DC | PRN

## 2017-10-19 MED ORDER — FENTANYL CITRATE (PF) 100 MCG/2ML IJ SOLN
INTRAMUSCULAR | Status: DC | PRN
  Administered 2017-10-19: 25 ug via INTRAVENOUS

## 2017-10-19 MED ORDER — SODIUM CHLORIDE 0.9 % IV SOLN: 250.0000 mL | INTRAVENOUS | Status: DC | PRN

## 2017-10-19 MED ORDER — HEPARIN (PORCINE) IN NACL 1000-0.9 UT/500ML-% IV SOLN
INTRAVENOUS | Status: AC
  Filled 2017-10-19: qty 1000

## 2017-10-19 MED ORDER — SODIUM CHLORIDE 0.9% FLUSH: 3.0000 mL | INTRAVENOUS | Status: DC | PRN

## 2017-10-19 MED ORDER — HEPARIN (PORCINE) IN NACL 100-0.45 UNIT/ML-% IJ SOLN
INTRAMUSCULAR | Status: AC
  Filled 2017-10-19: qty 250

## 2017-10-19 MED ORDER — TICAGRELOR 90 MG PO TABS
90.0000 mg | ORAL_TABLET | Freq: Two times a day (BID) | ORAL | Status: DC
  Administered 2017-10-20 – 2017-10-21 (×3): 90 mg via ORAL
  Filled 2017-10-19 (×3): qty 1

## 2017-10-19 MED ORDER — TICAGRELOR 90 MG PO TABS
90.0000 mg | ORAL_TABLET | Freq: Once | ORAL | Status: AC
  Administered 2017-10-19: 90 mg via ORAL
  Filled 2017-10-19: qty 1

## 2017-10-19 MED ORDER — ASPIRIN 81 MG PO CHEW: 81.0000 mg | CHEWABLE_TABLET | Freq: Every day | ORAL | Status: DC

## 2017-10-19 MED ORDER — LIDOCAINE HCL (PF) 1 % IJ SOLN
INTRAMUSCULAR | Status: DC | PRN
  Administered 2017-10-19: 2 mL

## 2017-10-19 MED ORDER — VITAMIN D 1000 UNITS PO TABS
5000.0000 [IU] | ORAL_TABLET | Freq: Three times a day (TID) | ORAL | Status: DC
  Administered 2017-10-19: 5000 [IU] via ORAL
  Filled 2017-10-19 (×2): qty 5

## 2017-10-19 MED ORDER — HEPARIN (PORCINE) IN NACL 100-0.45 UNIT/ML-% IJ SOLN: 1250.0000 [IU]/h | INTRAMUSCULAR | Status: DC

## 2017-10-19 MED ORDER — SODIUM CHLORIDE 0.9 % IV SOLN
INTRAVENOUS | Status: DC | PRN
  Administered 2017-10-19: 10 mL/h via INTRAVENOUS

## 2017-10-19 MED ORDER — NITROGLYCERIN 1 MG/10 ML FOR IR/CATH LAB
INTRA_ARTERIAL | Status: DC | PRN
  Administered 2017-10-19: 300 ug via INTRA_ARTERIAL
  Administered 2017-10-19 (×2): 200 ug via INTRA_ARTERIAL
  Administered 2017-10-19: 200 ug via INTRACORONARY

## 2017-10-19 MED ORDER — SODIUM CHLORIDE 0.9% FLUSH
3.0000 mL | Freq: Two times a day (BID) | INTRAVENOUS | Status: DC
  Administered 2017-10-19 – 2017-10-20 (×3): 3 mL via INTRAVENOUS

## 2017-10-19 MED ORDER — ASPIRIN EC 81 MG PO TBEC
81.0000 mg | DELAYED_RELEASE_TABLET | Freq: Every day | ORAL | Status: DC
  Administered 2017-10-20 – 2017-10-21 (×2): 81 mg via ORAL
  Filled 2017-10-19 (×2): qty 1

## 2017-10-19 MED ORDER — AMLODIPINE BESYLATE 5 MG PO TABS
5.0000 mg | ORAL_TABLET | Freq: Every day | ORAL | Status: DC
  Administered 2017-10-20: 5 mg via ORAL
  Filled 2017-10-19: qty 1

## 2017-10-19 MED ORDER — FINASTERIDE 5 MG PO TABS
5.0000 mg | ORAL_TABLET | Freq: Every morning | ORAL | Status: DC
  Administered 2017-10-20 – 2017-10-21 (×2): 5 mg via ORAL
  Filled 2017-10-19 (×2): qty 1

## 2017-10-19 MED ORDER — TICAGRELOR 90 MG PO TABS
ORAL_TABLET | ORAL | Status: AC
  Filled 2017-10-19: qty 2

## 2017-10-19 MED ORDER — METOPROLOL TARTRATE 25 MG PO TABS
25.0000 mg | ORAL_TABLET | Freq: Two times a day (BID) | ORAL | Status: DC
  Administered 2017-10-20: 25 mg via ORAL
  Filled 2017-10-19 (×2): qty 1

## 2017-10-19 MED ORDER — ZOLPIDEM TARTRATE 5 MG PO TABS: 5.0000 mg | ORAL_TABLET | Freq: Every evening | ORAL | Status: DC | PRN

## 2017-10-19 MED ORDER — ASPIRIN 81 MG PO CHEW
324.0000 mg | CHEWABLE_TABLET | ORAL | Status: DC
  Filled 2017-10-19: qty 4

## 2017-10-19 MED ORDER — HEPARIN (PORCINE) IN NACL 1000-0.9 UT/500ML-% IV SOLN
INTRAVENOUS | Status: DC | PRN
  Administered 2017-10-19 (×3): 500 mL

## 2017-10-19 MED ORDER — TIROFIBAN (AGGRASTAT) BOLUS VIA INFUSION
INTRAVENOUS | Status: DC | PRN
  Administered 2017-10-19: 2625 ug via INTRAVENOUS

## 2017-10-19 MED ORDER — ALPRAZOLAM 0.25 MG PO TABS
0.2500 mg | ORAL_TABLET | Freq: Two times a day (BID) | ORAL | Status: DC | PRN
  Administered 2017-10-19: 0.25 mg via ORAL
  Filled 2017-10-19: qty 1

## 2017-10-19 MED ORDER — ATORVASTATIN CALCIUM 80 MG PO TABS: 80.0000 mg | ORAL_TABLET | Freq: Every day | ORAL | Status: DC

## 2017-10-19 MED ORDER — MORPHINE SULFATE (PF) 4 MG/ML IV SOLN
4.0000 mg | Freq: Once | INTRAVENOUS | Status: AC
  Administered 2017-10-19: 4 mg via INTRAVENOUS

## 2017-10-19 MED ORDER — EPINEPHRINE PF 1 MG/ML IJ SOLN
INTRAMUSCULAR | Status: AC
  Filled 2017-10-19: qty 1

## 2017-10-19 MED ORDER — FLUTICASONE PROPIONATE 50 MCG/ACT NA SUSP
2.0000 | Freq: Every day | NASAL | Status: DC
  Administered 2017-10-19 – 2017-10-20 (×2): 2 via NASAL
  Filled 2017-10-19: qty 16

## 2017-10-19 MED ORDER — LIDOCAINE HCL (PF) 1 % IJ SOLN
INTRAMUSCULAR | Status: AC
  Filled 2017-10-19: qty 60

## 2017-10-19 MED ORDER — HEPARIN SODIUM (PORCINE) 5000 UNIT/ML IJ SOLN
INTRAMUSCULAR | Status: AC
  Filled 2017-10-19: qty 1

## 2017-10-19 MED ORDER — MIDAZOLAM HCL 2 MG/2ML IJ SOLN
INTRAMUSCULAR | Status: DC | PRN
  Administered 2017-10-19: 1 mg via INTRAVENOUS

## 2017-10-19 MED ORDER — ATORVASTATIN CALCIUM 80 MG PO TABS
80.0000 mg | ORAL_TABLET | Freq: Every day | ORAL | Status: DC
  Administered 2017-10-19 – 2017-10-20 (×2): 80 mg via ORAL
  Filled 2017-10-19 (×2): qty 1

## 2017-10-19 MED ORDER — ONDANSETRON HCL 4 MG/2ML IJ SOLN: 4.0000 mg | Freq: Four times a day (QID) | INTRAMUSCULAR | Status: DC | PRN

## 2017-10-19 MED ORDER — ASPIRIN EC 325 MG PO TBEC: 325.0000 mg | DELAYED_RELEASE_TABLET | Freq: Once | ORAL | Status: DC

## 2017-10-19 MED ORDER — SODIUM CHLORIDE 0.9 % IV SOLN: INTRAVENOUS | Status: AC

## 2017-10-19 MED ORDER — MIDAZOLAM HCL 2 MG/2ML IJ SOLN
INTRAMUSCULAR | Status: AC
  Filled 2017-10-19: qty 2

## 2017-10-19 MED ORDER — SODIUM CHLORIDE 0.9 % IV SOLN: INTRAVENOUS | Status: DC

## 2017-10-19 MED ORDER — HEPARIN BOLUS VIA INFUSION: 4000.0000 [IU] | Freq: Once | INTRAVENOUS | Status: DC

## 2017-10-19 MED ORDER — HEPARIN SODIUM (PORCINE) 1000 UNIT/ML IJ SOLN
INTRAMUSCULAR | Status: DC | PRN
  Administered 2017-10-19: 6000 [IU] via INTRAVENOUS

## 2017-10-19 MED ORDER — VERAPAMIL HCL 2.5 MG/ML IV SOLN
INTRAVENOUS | Status: AC
  Filled 2017-10-19: qty 2

## 2017-10-19 MED ORDER — ASPIRIN 300 MG RE SUPP: 300.0000 mg | RECTAL | Status: DC

## 2017-10-19 MED ORDER — ASPIRIN 81 MG PO CHEW
CHEWABLE_TABLET | ORAL | Status: AC
  Administered 2017-10-19: 325 mg
  Filled 2017-10-19: qty 4

## 2017-10-19 MED ORDER — SODIUM CHLORIDE 0.9 % IV SOLN
INTRAVENOUS | Status: AC | PRN
  Administered 2017-10-19: 50 mL/h via INTRAVENOUS

## 2017-10-19 MED ORDER — LABETALOL HCL 5 MG/ML IV SOLN: 10.0000 mg | INTRAVENOUS | Status: AC | PRN

## 2017-10-19 MED ORDER — TIROFIBAN HCL IV 12.5 MG/250 ML
INTRAVENOUS | Status: AC
  Filled 2017-10-19: qty 250

## 2017-10-19 MED ORDER — MUSCLE RUB 10-15 % EX CREA
TOPICAL_CREAM | CUTANEOUS | Status: DC | PRN
  Filled 2017-10-19: qty 85

## 2017-10-19 MED ORDER — TIROFIBAN HCL IV 12.5 MG/250 ML
INTRAVENOUS | Status: DC | PRN
  Administered 2017-10-19: 0.15 ug/kg/min via INTRAVENOUS

## 2017-10-19 SURGICAL SUPPLY — 22 items
BALLN EMERGE MR 2.25X12 (BALLOONS) ×2
BALLN ~~LOC~~ EMERGE MR 3.0X8 (BALLOONS) ×2
BALLOON EMERGE MR 2.25X12 (BALLOONS) IMPLANT
BALLOON ~~LOC~~ EMERGE MR 3.0X8 (BALLOONS) IMPLANT
CATH 5FR JL3.5 JR4 ANG PIG MP (CATHETERS) ×1 IMPLANT
CATH LAUNCHER 6FR EBU3.5 (CATHETERS) ×1 IMPLANT
DEVICE RAD COMP TR BAND LRG (VASCULAR PRODUCTS) ×1 IMPLANT
ELECT DEFIB PAD ADLT CADENCE (PAD) ×1 IMPLANT
GLIDESHEATH SLEND SS 6F .021 (SHEATH) ×1 IMPLANT
GUIDEWIRE INQWIRE 1.5J.035X260 (WIRE) IMPLANT
HOVERMATT SINGLE USE (MISCELLANEOUS) ×1 IMPLANT
INQWIRE 1.5J .035X260CM (WIRE) ×2
KIT ENCORE 26 ADVANTAGE (KITS) ×1 IMPLANT
KIT HEART LEFT (KITS) ×2 IMPLANT
KIT HEMO VALVE WATCHDOG (MISCELLANEOUS) ×1 IMPLANT
PACK CARDIAC CATHETERIZATION (CUSTOM PROCEDURE TRAY) ×2 IMPLANT
STENT SYNERGY DES 2.75X16 (Permanent Stent) ×1 IMPLANT
SYR MEDRAD MARK V 150ML (SYRINGE) ×2 IMPLANT
TRANSDUCER W/STOPCOCK (MISCELLANEOUS) ×2 IMPLANT
TUBING CIL FLEX 10 FLL-RA (TUBING) ×2 IMPLANT
WIRE ASAHI PROWATER 180CM (WIRE) ×1 IMPLANT
WIRE HI TORQ BMW 190CM (WIRE) ×1 IMPLANT

## 2017-10-19 NOTE — Progress Notes (Signed)
Paged Cards MD on call about pt HR going as low as 37. HR has been in the 40's all day and patient is currently stable and asymptomatic.   RN waiting for MD response at this time. No new orders received.   [place]

## 2017-10-19 NOTE — Progress Notes (Signed)
ANTICOAGULATION CONSULT NOTE - Initial Consult  Pharmacy Consult for heparin Indication: chest pain/ACS  Allergies  Allergen Reactions  . Doxycycline Hyclate Rash  . Penicillins Itching and Rash  . Rifampin Rash    Patient Measurements: Heparin Dosing Weight: 96 kg  Vital Signs:    Labs: Recent Labs    10/19/17 1118  HGB 15.0  HCT 44.2  PLT 192     Medical History: Past Medical History:  Diagnosis Date  . Colon polyp   . Diverticular disease   . Erectile dysfunction   . GERD (gastroesophageal reflux disease)    NO Recent problems  . Hyperlipidemia   . Osteoarthritis   . Seborrheic keratosis      Assessment: 67 yo male admitted with chest pain with radiation to arm. Planning to start heparin infusion for rule out ACS. CBC wnl, Scr ip.   Goal of Therapy:  Heparin level 0.3-0.7 units/ml Monitor platelets by anticoagulation protocol: Yes    Plan:  -Heparin bolus 4000 units x1 then 1250 units/hr -Daily HL, CBC -Check level in 6 hours -Follow up current weight and heparin dosing weight   Alec Johnson, Alec Johnson 10/19/2017,11:34 AM

## 2017-10-19 NOTE — Progress Notes (Signed)
CRITICAL VALUE ALERT  Critical Value:  Troponin 7.22  Date & Time Notied:  10/19/2017  At [place]   Provider Notified: NP Phylliss Bob text page via [email or web address]  Orders Received/Actions taken: no new orders at this time as expected post cath lab value.  Discussed with NP Phylliss Bob patient's respiratory status and chest pain status.  RN to continue to monitor patient.

## 2017-10-19 NOTE — H&P (Addendum)
History & Physical    Patient ID: Alec Johnson MRN: [id number], DOB/AGE: 69-20-1952   Admit date: 10/19/2017   Primary Physician: Katherina Mires, MD Primary Cardiologist: Dr. Stanford Breed   Patient Profile    66 yo male with PMH of asymptomatic bradycardia, HL and GERD who presented to [facility] with several days of chest pain. Code STEMI called and transferred to [facility] for emergent cardiac cath.   Past Medical History   Past Medical History:  Diagnosis Date  . Colon polyp   . Diverticular disease   . Erectile dysfunction   . GERD (gastroesophageal reflux disease)    NO Recent problems  . Hyperlipidemia   . Osteoarthritis   . Seborrheic keratosis     Past Surgical History:  Procedure Laterality Date  . athroscopic knee surgery     BIL KNEES  . CORONARY/GRAFT ACUTE MI REVASCULARIZATION N/A 10/19/2017   Procedure: Coronary/Graft Acute MI Revascularization;  Surgeon: Jettie Booze, MD;  Location: [facility];  Service: Cardiovascular;  Laterality: N/A;  . LEFT HEART CATH AND CORONARY ANGIOGRAPHY N/A 10/19/2017   Procedure: LEFT HEART CATH AND CORONARY ANGIOGRAPHY;  Surgeon: Jettie Booze, MD;  Location: [facility];  Service: Cardiovascular;  Laterality: N/A;  . LEG SURGERY  1966   PINNING DUE TO INJURY  . NOSE SURGERY    . TOTAL KNEE ARTHROPLASTY Right 01/18/2014   Procedure: RIGHT TOTAL KNEE ARTHROPLASTY;  Surgeon: Tobi Bastos, MD;  Location: [facility];  Service: Orthopedics;  Laterality: Right;     Allergies  Allergies  Allergen Reactions  . Doxycycline Hyclate Rash  . Penicillins Itching and Rash  . Rifampin Rash    History of Present Illness    Alec Johnson is a 67 yo male with PMH of asymptomatic bradycardia, HL and GERD. He was seen by Dr. Stanford Breed back in 2013 in regards to bradycardia and underwent an ETT which was normal. Sees a PCP for chronic illnesses. Presented to [facility] today with chest pain that back on Friday and continued into  Saturday. Symptoms reoccurred today and he presented to [facility]. EKG there showed ST elevation in inferior leads with depression in septal leads. Given ongoing pain he was brought to the cath lab for emergent cardiac cath. Prior to leaving the department he did have brief episode of CPR with reported asystole. Reported to last around 10-20 seconds. Also given ASA and IV heparin prior to arrival.   Home Medications    Prior to Admission medications   Medication Sig Start Date End Date Taking? Authorizing Provider  acetaminophen (TYLENOL) 500 MG tablet Take 1,000 mg by mouth every 6 (six) hours as needed for mild pain or moderate pain.    [provider]  finasteride (PROSCAR) 5 MG tablet Take 5 mg by mouth every morning.    [provider]  fluticasone (FLONASE) 50 MCG/ACT nasal spray Place 2 sprays into both nostrils daily. 09/21/13 09/21/14  Jonathon Resides, MD  HYDROmorphone (DILAUDID) 2 MG tablet Take 1-2 tablets (2-4 mg total) by mouth every 4 (four) hours as needed for severe pain. Patient not taking: Reported on 03/08/2014 01/18/14   Ardeen Jourdain, PA-C  methocarbamol (ROBAXIN) 500 MG tablet Take 1 tablet (500 mg total) by mouth every 6 (six) hours as needed for muscle spasms. 01/18/14   Cecilio Asper, Amber, PA-C  rivaroxaban (XARELTO) 10 MG TABS tablet Take 1 tablet (10 mg total) by mouth daily with breakfast. 01/19/14   Ardeen Jourdain, PA-C  simvastatin (ZOCOR) 80 MG tablet Take 1/2 - 1 tab po QHS for cholesterol Patient taking differently: Take 40 mg by mouth at bedtime. Take 1/2 - 1 tab po QHS for cholesterol 09/21/13 09/21/14  Zanard, Bernadene Bell, MD    Family History    Family History  Problem Relation Age of Onset  . Coronary artery disease Brother        MI  . Heart attack Brother   . Stroke Mother   . Cancer Father        Lung Cancer  . Cancer Paternal Grandfather        Lung Cancer  . Heart attack Paternal Grandfather     Social History    Social History    Socioeconomic History  . Marital status: Married    Spouse name: Alec Johnson  . Number of children: 2  . Years of education: 50  . Highest education level: Not on file  Occupational History  . Occupation: RETIRED     Employer: [employer]  Social Needs  . Financial resource strain: Not on file  . Food insecurity:    Worry: Not on file    Inability: Not on file  . Transportation needs:    Medical: Not on file    Non-medical: Not on file  Tobacco Use  . Smoking status: Former Smoker    Packs/day: 2.00    Years: 45.00    Pack years: 90.00    Types: Cigarettes    Last attempt to quit: 01/11/2012    Years since quitting: 5.7  . Smokeless tobacco: Never Used  . Tobacco comment: He has quit smoking.    Substance and Sexual Activity  . Alcohol use: Yes    Comment: Occasional  . Drug use: No  . Sexual activity: Yes  Lifestyle  . Physical activity:    Days per week: Not on file    Minutes per session: Not on file  . Stress: Not on file  Relationships  . Social connections:    Talks on phone: Not on file    Gets together: Not on file    Attends religious service: Not on file    Active member of club or organization: Not on file    Attends meetings of clubs or organizations: Not on file    Relationship status: Not on file  . Intimate partner violence:    Fear of current or ex partner: Not on file    Emotionally abused: Not on file    Physically abused: Not on file    Forced sexual activity: Not on file  Other Topics Concern  . 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.     Education:  [employer]    Tobacco Use/Exposure: He has quit smoking.        Alcohol Use: Occasional    Drug Use:  None   Diet:  Regular   Exercise:  He was walking daily until he hurt his  hip.     Hobbies:  Fishing, Gardening, Golf      Review of Systems    See HPI  All other systems reviewed and are otherwise negative except as noted above.  Physical Exam    Blood pressure (!) 105/58, pulse (!) 48, temperature 97.8 F (36.6 C), temperature source  Oral, resp. rate 18, height 5\' 10"  (1.778 m), weight 106.6 kg, SpO2 97 %.   PHYSICAL EXAM PER MD  Labs    Troponin (Point of Care Test) No results for input(s): TROPIPOC in the last 72 hours. Recent Labs    10/19/17 1118  TROPONINI 1.01*   Lab Results  Component Value Date   WBC 7.6 10/19/2017   HGB 15.0 10/19/2017   HCT 44.2 10/19/2017   MCV 93.6 10/19/2017   PLT 192 10/19/2017    Recent Labs  Lab 10/19/17 1118  NA 140  K 4.0  CL 103  CO2 26  BUN 17  CREATININE 0.96  CALCIUM 9.3  GLUCOSE 135*   Lab Results  Component Value Date   CHOL 153 09/21/2012   HDL 49 09/21/2012   LDLCALC 90 09/21/2012   TRIG 71 09/21/2012   No results found for: [facility]   Radiology Studies    No results found.  ECG & Cardiac Imaging    EKG: SR with ST elevation inferior leads, and ST depression in septal leads.   Assessment & Plan    67 yo male with PMH of asymptomatic bradycardia, HL and GERD who presented to [facility] with several days of chest pain. Code STEMI called and transferred to [facility] for emergent cardiac cath.  1. STEMI: Pt reported several days of intermittent chest pain that started on Friday and became worse this morning. EKG with ST elevation in inferior leads with depression in septal leads. Given 324 asa and IV heparin prior to transfer. Further recommendations post cath.   2. HL: check lipids -- high dose statin  3. Bradycardia: Hx of the same but has been asymptomatic. HR stable in the lab. Will monitor on telemetry.   Severity of Illness: The appropriate patient status for this patient is INPATIENT. Inpatient status is judged to be reasonable and necessary in order to provide the required  intensity of service to ensure the patient's safety. The patient's presenting symptoms, physical exam findings, and initial radiographic and laboratory data in the context of their chronic comorbidities is felt to place them at high risk for further clinical deterioration. Furthermore, it is not anticipated that the patient will be medically stable for discharge from the hospital within 2 midnights of admission. The following factors support the patient status of inpatient.   " The patient's presenting symptoms include chest pain. " The worrisome physical exam findings include none. " The initial radiographic and laboratory data are worrisome because of EKG with ST elevation in inferior leads. " The chronic co-morbidities include HL.   * I certify that at the point of admission it is my clinical judgment that the patient will require inpatient hospital care spanning beyond 2 midnights from the point of admission due to high intensity of service, high risk for further deterioration and high frequency of surveillance required.*   Signed, Reino Bellis, NP-C Pager [phone] 10/19/2017, 2:12 PM  I have examined the patient and reviewed assessment and plan and discussed with patient.  Agree with above as stated.  Patient with inferior STEMI.  Had large ramus branch occluded.  THis was a tortuous vessel and succussfully stented. CP resolved.   Will need DAPT, statin and beta blocker.  BP has not been an issue for him.  EF was normal.  Continue aggressive secondary prevention.  Watch in ICU tonight.  Hopefully home in 2 days.    He will need counseling regarding diet and weight loss.  Check A1C as well.  Larae Grooms

## 2017-10-19 NOTE — ED Provider Notes (Signed)
[facility] Emergency Department Provider Note MRN:  [id number]  Arrival date & time: 10/19/17     Chief Complaint   Chest Pain   History of Present Illness   Alec Johnson is a 67 y.o. year-old male with a history of hyperlipidemia presenting to the ED with chief complaint of chest pain.  Pain located in the right side of the chest, nonradiating, began suddenly 2 hours ago, constant.  9 out of 10 in severity, associated with lightheadedness.  Denies shortness of breath, endorsing dull frontal headache.  Review of Systems  A complete 10 system review of systems was obtained and all systems are negative except as noted in the HPI and PMH.   Patient's Health History    Past Medical History:  Diagnosis Date  . Colon polyp   . Diverticular disease   . Erectile dysfunction   . GERD (gastroesophageal reflux disease)    NO Recent problems  . Hyperlipidemia   . Osteoarthritis   . Seborrheic keratosis     Past Surgical History:  Procedure Laterality Date  . athroscopic knee surgery     BIL KNEES  . LEG SURGERY  1966   PINNING DUE TO INJURY  . NOSE SURGERY    . 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 Coronary artery disease Brother        MI  . Heart attack Brother   . Stroke Mother   . Cancer Father        Lung Cancer  . Cancer Paternal Grandfather        Lung Cancer  . Heart attack Paternal Grandfather     Social History   Socioeconomic History  . Marital status: Married    Spouse name: [name]  . Number of children: 2  . Years of education: 35  . Highest education level: Not on file  Occupational History  . Occupation: RETIRED     Employer: [employer]  Social Needs  . Financial resource strain: Not on file  . Food insecurity:    Worry:  Not on file    Inability: Not on file  . Transportation needs:    Medical: Not on file    Non-medical: Not on file  Tobacco Use  . Smoking status: Former Smoker    Packs/day: 2.00    Years: 45.00    Pack years: 90.00    Types: Cigarettes    Last attempt to quit: 01/11/2012    Years since quitting: 5.7  . Smokeless tobacco: Never Used  . Tobacco comment: He has quit smoking.    Substance and Sexual Activity  . Alcohol use: Yes    Comment: Occasional  . Drug use: No  . Sexual activity: Yes  Lifestyle  . Physical activity:    Days per week: Not on file    Minutes per session: Not on file  . Stress: Not on file  Relationships  . Social connections:    Talks on phone: Not on file    Gets together: Not on file    Attends religious service: Not on file    Active member of club or organization: Not on file    Attends meetings of clubs or organizations: Not on file    Relationship status: Not on file  .  Intimate partner violence:    Fear of current or ex partner: Not on file    Emotionally abused: Not on file    Physically abused: Not on file    Forced sexual activity: Not on file  Other Topics Concern  . 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.     Education:  [employer]    Tobacco Use/Exposure: He has quit smoking.        Alcohol Use: Occasional    Drug Use:  None   Diet:  Regular   Exercise:  He was walking daily until he hurt his hip.     Hobbies:  Fishing, Gardening, Golf      Physical Exam  Vital Signs and Nursing Notes reviewed There were no vitals filed for this visit.  CONSTITUTIONAL:  ill-appearing, diaphoretic NEURO:  Alert and oriented x 3, no focal deficits EYES:  eyes equal and reactive ENT/NECK:  no LAD, no JVD CARDIO: Bradycardic rate,  well-perfused, normal S1 and S2 PULM:  CTAB no wheezing or rhonchi GI/GU:  normal bowel sounds, non-distended, non-tender MSK/SPINE:  No gross deformities, no edema SKIN:  no rash, atraumatic PSYCH:  Appropriate speech and behavior  Diagnostic and Interventional Summary    EKG Interpretation  Date/Time:    Ventricular Rate:    PR Interval:    QRS Duration:   QT Interval:    QTC Calculation:   R Axis:     Text Interpretation:        Labs Reviewed  BASIC METABOLIC PANEL - Abnormal; Notable for the following components:      Result Value   Glucose, Bld 135 (*)    All other components within normal limits  CBC  TROPONIN I    DG Chest 2 View    (Results Pending)    Medications  aspirin EC tablet 325 mg (has no administration in time range)  morphine 4 MG/ML injection (has no administration in time range)  heparin injection 60 Units/kg (has no administration in time range)  heparin ADULT infusion 100 units/mL (25000 units/259mL sodium chloride 0.45%) (has no administration in time range)  heparin bolus via infusion 4,000 Units (has no administration in time range)  heparin 5000 UNIT/ML injection (has no administration in time range)  aspirin 81 MG chewable tablet (325 mg  Given 10/19/17 1117)  morphine 4 MG/ML injection 4 mg (4 mg Intravenous Given 10/19/17 1122)     Procedures  Critical Care Critical Care Documentation Critical care time provided by me (excluding procedures): 35 minutes  Condition necessitating critical care: ST segment elevation myocardial infarction  Components of critical care management: reviewing of prior records, laboratory and imaging interpretation, frequent re-examination and reassessment of vital signs, administration of IV heparin bolus, IV fluid boluses    ED Course and Medical Decision Making  I have reviewed the triage vital signs and the nursing notes.  Pertinent labs & imaging results that were available during my care of the patient  were reviewed by me and considered in my medical decision making (see below for details).    Concerning chest pain presentation with EKG revealing elevation in lead III with reciprocal depressions, code STEMI protocol initiated.  Minutes later, patient became bradycardic, profusely diaphoretic, less responsive.  Pressure bag 1 L fluid started.  Loss  of pulses, CPR initiated.  After only 10 to 20 seconds, patient woke up, began talking.  Return of strong peripheral pulses.  No arrhythmia on the monitor.  Accepted for transfer to [facility] for PCI.  Heparin bolus given, second liter normal saline given.  Barth Kirks. Sedonia Small, MD [place] [email or web address]  Final Clinical Impressions(s) / ED Diagnoses     ICD-10-CM   1. ST elevation myocardial infarction (STEMI), unspecified artery (HCC) I21.3   2. Cardiac arrest [facility]) I46.9     ED Discharge Orders    None         Daxtin, Leiker, MD 10/19/17 1148

## 2017-10-19 NOTE — ED Triage Notes (Signed)
Pt c/o rt chest pain radiating to lt upper arm and headache x1hr; states had the same episode Friday morning and Saturday lasting 2hrs

## 2017-10-19 NOTE — ED Notes (Signed)
[facility] at bedside and pt in route to Cath lab .

## 2017-10-20 ENCOUNTER — Inpatient Hospital Stay (HOSPITAL_COMMUNITY): Payer: Medicare Other

## 2017-10-20 DIAGNOSIS — I2129 ST elevation (STEMI) myocardial infarction involving other sites: Secondary | ICD-10-CM

## 2017-10-20 DIAGNOSIS — I119 Hypertensive heart disease without heart failure: Secondary | ICD-10-CM

## 2017-10-20 LAB — CBC
HCT: 40.2 % (ref 39.0–52.0)
Hemoglobin: 13.1 g/dL (ref 13.0–17.0)
MCH: 31.7 pg (ref 26.0–34.0)
MCHC: 32.6 g/dL (ref 30.0–36.0)
MCV: 97.3 fL (ref 78.0–100.0)
Platelets: 172 10*3/uL (ref 150–400)
RBC: 4.13 MIL/uL — ABNORMAL LOW (ref 4.22–5.81)
RDW: 12.4 % (ref 11.5–15.5)
WBC: 8.7 10*3/uL (ref 4.0–10.5)

## 2017-10-20 LAB — TROPONIN I: Troponin I: 12.69 ng/mL (ref <0.03)

## 2017-10-20 LAB — LIPID PANEL
CHOLESTEROL: 127 mg/dL (ref 0–200)
HDL: 34 mg/dL — AB (ref >40)
LDL Cholesterol: 76 mg/dL (ref 0–99)
TRIGLYCERIDES: 87 mg/dL (ref <150)
Total CHOL/HDL Ratio: 3.7 RATIO
VLDL: 17 mg/dL (ref 0–40)

## 2017-10-20 LAB — BASIC METABOLIC PANEL
Anion gap: 7 (ref 5–15)
BUN: 9 mg/dL (ref 8–23)
CALCIUM: 8.7 mg/dL — AB (ref 8.9–10.3)
CO2: 28 mmol/L (ref 22–32)
CREATININE: 0.88 mg/dL (ref 0.61–1.24)
Chloride: 106 mmol/L (ref 98–111)
GFR calc Af Amer: >60 mL/min (ref >60)
GLUCOSE: 110 mg/dL — AB (ref 70–99)
Potassium: 3.8 mmol/L (ref 3.5–5.1)
SODIUM: 141 mmol/L (ref 135–145)

## 2017-10-20 LAB — HIV ANTIBODY (ROUTINE TESTING W REFLEX): HIV Screen 4th Generation wRfx: NONREACTIVE

## 2017-10-20 LAB — ECHOCARDIOGRAM COMPLETE
HEIGHTINCHES: 70 in
Weight: 3717.84 oz

## 2017-10-20 LAB — HEMOGLOBIN A1C
Hgb A1c MFr Bld: 5.8 % — ABNORMAL HIGH (ref 4.8–5.6)
Mean Plasma Glucose: 120 mg/dL

## 2017-10-20 MED ORDER — LISINOPRIL 10 MG PO TABS
10.0000 mg | ORAL_TABLET | Freq: Every day | ORAL | Status: DC
  Administered 2017-10-21: 10 mg via ORAL
  Filled 2017-10-20: qty 1

## 2017-10-20 MED ORDER — VITAMIN D 1000 UNITS PO TABS
5000.0000 [IU] | ORAL_TABLET | ORAL | Status: DC
  Administered 2017-10-21: 5000 [IU] via ORAL
  Filled 2017-10-20: qty 5

## 2017-10-20 NOTE — Progress Notes (Signed)
Report called to 6E to [employer]. Patient going to room 09. All belongings are accounted for.

## 2017-10-20 NOTE — Progress Notes (Addendum)
Progress Note  Patient Name: Alec Johnson Date of Encounter: 10/20/2017  Primary Cardiologist: No primary care provider on file.  Subjective   Some R Chest pain this AM, but reproducible on palpation, Likely 2/2 CPR yesterday. Denies other chest pain or SOB. R radial cath site looks good.  Inpatient Medications    Scheduled Meds: . amLODipine  5 mg Oral Daily  . aspirin EC  81 mg Oral Daily  . atorvastatin  80 mg Oral q1800  . cholecalciferol  5,000 Units Oral TID  . finasteride  5 mg Oral q morning - 10a  . fluticasone  2 spray Each Nare QHS  . metoprolol tartrate  25 mg Oral BID  . sodium chloride flush  3 mL Intravenous Q12H  . ticagrelor  90 mg Oral BID   Continuous Infusions: . sodium chloride    . sodium chloride     PRN Meds: sodium chloride, acetaminophen, acetaminophen, ALPRAZolam, MUSCLE RUB, nitroGLYCERIN, ondansetron (ZOFRAN) IV, sodium chloride flush, zolpidem   Vital Signs    Vitals:   10/20/17 0600 10/20/17 0720 10/20/17 0807 10/20/17 0900  BP: 108/61 131/60  130/65  Pulse: (!) 43 (!) 47  (!) 48  Resp: 15 18  (!) 21  Temp:   98.4 F (36.9 C)   TempSrc:   Oral   SpO2: 99% 100%  99%  Weight:      Height:        Intake/Output Summary (Last 24 hours) at 10/20/2017 1017 Last data filed at 10/20/2017 0928 Gross per 24 hour  Intake 1757.97 ml  Output 3276 ml  Net -1518.03 ml   Filed Weights   10/19/17 1200 10/19/17 1400 10/20/17 0500  Weight: 105 kg 106.6 kg 105.4 kg    Telemetry    Sinus Bradycardia, Rare PVCs  - Personally Reviewed  ECG    Sinus Bradycardia; T-wave inversion III, ST-elevation resolved - Personally Reviewed  Physical Exam   GEN: No acute distress.   Neck: No JVD Cardiac: RRR, no murmurs, rubs, or gallops.  Respiratory: Clear to auscultation bilaterally. GI: Soft, nontender, non-distended  MS: No edema; No deformity; R radial cath site clean and dry without hematoma; Right chest tender to palpation Neuro:  Nonfocal    Psych: Normal affect   Labs    Chemistry Recent Labs  Lab 10/19/17 1118 10/20/17 0232  NA 140 141  K 4.0 3.8  CL 103 106  CO2 26 28  GLUCOSE 135* 110*  BUN 17 9  CREATININE 0.96 0.88  CALCIUM 9.3 8.7*  GFRNONAA >60 >60  GFRAA >60 >60  ANIONGAP 11 7     Hematology Recent Labs  Lab 10/19/17 1118 10/20/17 0232  WBC 7.6 8.7  RBC 4.72 4.13*  HGB 15.0 13.1  HCT 44.2 40.2  MCV 93.6 97.3  MCH 31.8 31.7  MCHC 33.9 32.6  RDW 12.7 12.4  PLT 192 172    Cardiac Enzymes Recent Labs  Lab 10/19/17 1118 10/19/17 1635 10/19/17 2019 10/20/17 0232  TROPONINI 1.01* 7.22* 12.61* 12.69*   No results for input(s): TROPIPOC in the last 168 hours.   BNPNo results for input(s): BNP, PROBNP in the last 168 hours.   DDimer No results for input(s): DDIMER in the last 168 hours.   Radiology    Dg Chest Port 1 View  Result Date: 10/19/2017 CLINICAL DATA:  Right-sided chest pain and shortness of breath. History of MI with revascularization on October 19, 2017. EXAM: PORTABLE CHEST 1 VIEW COMPARISON:  CT  scan of the chest of November 24, 2016 for lung cancer screening. FINDINGS: The lungs are adequately inflated. The interstitial markings are coarse. The cardiac silhouette is enlarged. The pulmonary vascularity is mildly prominent centrally. The mediastinum is normal in width. There is no pleural effusion. IMPRESSION: Mild interstitial edema. Cardiomegaly with mild central pulmonary vascular congestion. Electronically Signed   By: David  Martinique M.D.   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 male Presenting as code STEMI from [facility], s/p 10-20 sec CPR for possible arrest in their ED.  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 S/P DES placement w/ 0% residual stenosis. Stable to transfer to telemetry. A1C 5.8. Will  modify medications for risk reduction. Discussed dietary changes, discussion will continue with cardiac rehab. Hopefully home tomorrow. - DAPT (ASA/Brilinta) - Atorvastatin 80mg  Daily - Stop metoprolol - Transition from Amlodipine to Lisinopril Daily  2) Asymptomatic Bradycardia: Remains in sinus brady without symtpoms; to 30s overnight (with BB having been held) w/ rare PVCs, no significant pauses or symptoms.  - Will discontinue BB given significant baseline bradycardia and no history of heart failure   For questions or updates, please contact [place] Please consult [email or web address] for contact info under      Signed, Neva Seat, MD  10/20/2017, 10:17 AM    I have examined the patient and reviewed assessment and plan and discussed with patient.  Agree with above as stated.  Holding metoprolol due to bradycardia.  COntineu DAPT.  DOIng well.  Move to tele.  Hopeful for discharge tomorrow.   Larae Grooms

## 2017-10-20 NOTE — Progress Notes (Signed)
CARDIAC REHAB PHASE I   PRE:  Rate/Rhythm: 50 SB    BP: sitting 125/62    SaO2: 99 RA  MODE:  Ambulation: 370 ft   POST:  Rate/Rhythm: 55 SB with bigeminy briefly after walk, 64 SR walking    BP: sitting 136/58     SaO2:    Pt tolerated well, no c/o. Had brief bigeminy PVCs after walk. Asx. Ed completed with pt and family. Good reception. Understands the importance of Brilinta/ASA. Will refer to [place]. He can walk more. [place], ACSM 10/20/2017 2:26 PM

## 2017-10-20 NOTE — Care Management Note (Addendum)
Case Management Note  Patient Details  Name: DESHONE LYSSY MRN: [id number] Date of Birth: March 17, 1950  Subjective/Objective:   STEMI s/p cath with PCI.                 Action/Plan: CM met with patient/son to discuss transitional needs. Patient is independent, lives at home with spouse with no DME in use. PCP verified as: Dr. Suzanna Obey; Pharmacy of choice: [facility], [place] with Prescription coverage: [employer]. CM consult for Brilinta; CM discussed [place] program "Meds to Bed" with patient interested in obtaining his Brilinta from the [facility] prior to his transition home. CM has updated the [facility] Pharmacist, but A prescription will need to be sent to the [facility] via e-scribe, phone or fax. Patient indicated family will provide transportation home. CM will continue to follow.   Expected Discharge Date:                  Expected Discharge Plan:  Home/Self Care  In-House Referral:  NA  Discharge planning Services  CM Consult(Brilinta)  Post Acute Care Choice:  NA Choice offered to:  NA  DME Arranged:  N/A DME Agency:  NA  HH Arranged:  NA HH Agency:  NA  Status of Service:  In process, will continue to follow  If discussed at Long Length of Stay Meetings, dates discussed:    Additional Comments:  Midge Minium RN, BSN, NCM-BC, ACM-RN [phone] 10/20/2017, 3:12 PM

## 2017-10-20 NOTE — Progress Notes (Signed)
  Echocardiogram 2D Echocardiogram has been performed.  Alec Johnson 10/20/2017, 3:54 PM

## 2017-10-20 NOTE — Care Management (Addendum)
10-20-17  BENEFITS CHECK :  # 1.  S/W [facility]  @ [facility]  #  [phone]  TICAGRELOR- NONE FORMULARY  BRILINTA   90 MG BID COVER- YES CO-PAY- $ 418.48  Q/L  TWO PILL PER  DAY TIER- 3 DRUG PRIOR APPROVAL- NO  DEDUCTIBLE: NOT MET  $ 373.48 + 45.00= $418.48  PREFERRED  PHARMACY : YES [facility] AND [facility]

## 2017-10-21 ENCOUNTER — Encounter (HOSPITAL_COMMUNITY): Payer: Self-pay | Admitting: Cardiology

## 2017-10-21 ENCOUNTER — Telehealth: Payer: Self-pay | Admitting: Cardiology

## 2017-10-21 DIAGNOSIS — E782 Mixed hyperlipidemia: Secondary | ICD-10-CM

## 2017-10-21 LAB — CBC
HCT: 40 % (ref 39.0–52.0)
Hemoglobin: 13.1 g/dL (ref 13.0–17.0)
MCH: 32 pg (ref 26.0–34.0)
MCHC: 32.8 g/dL (ref 30.0–36.0)
MCV: 97.6 fL (ref 78.0–100.0)
Platelets: 168 10*3/uL (ref 150–400)
RBC: 4.1 MIL/uL — AB (ref 4.22–5.81)
RDW: 12.4 % (ref 11.5–15.5)
WBC: 7.5 10*3/uL (ref 4.0–10.5)

## 2017-10-21 LAB — BASIC METABOLIC PANEL
ANION GAP: 5 (ref 5–15)
BUN: 12 mg/dL (ref 8–23)
CHLORIDE: 109 mmol/L (ref 98–111)
CO2: 25 mmol/L (ref 22–32)
Calcium: 8.2 mg/dL — ABNORMAL LOW (ref 8.9–10.3)
Creatinine, Ser: 0.88 mg/dL (ref 0.61–1.24)
GFR calc Af Amer: >60 mL/min (ref >60)
Glucose, Bld: 99 mg/dL (ref 70–99)
POTASSIUM: 3.6 mmol/L (ref 3.5–5.1)
Sodium: 139 mmol/L (ref 135–145)

## 2017-10-21 LAB — HEMOGLOBIN A1C
HEMOGLOBIN A1C: 5.8 % — AB (ref 4.8–5.6)
Mean Plasma Glucose: 120 mg/dL

## 2017-10-21 MED ORDER — NITROGLYCERIN 0.4 MG SL SUBL: 0.4000 mg | SUBLINGUAL_TABLET | SUBLINGUAL | 1 refills | Status: DC | PRN

## 2017-10-21 MED ORDER — ATORVASTATIN CALCIUM 80 MG PO TABS: 80.0000 mg | ORAL_TABLET | Freq: Every day | ORAL | 1 refills | Status: DC

## 2017-10-21 MED ORDER — TICAGRELOR 90 MG PO TABS: 90.0000 mg | ORAL_TABLET | Freq: Two times a day (BID) | ORAL | 0 refills | Status: DC

## 2017-10-21 MED ORDER — LISINOPRIL 10 MG PO TABS: 10.0000 mg | ORAL_TABLET | Freq: Every day | ORAL | 1 refills | Status: DC

## 2017-10-21 MED FILL — BRILINTA 90 MG TABLET: 90 | 30 days supply | Qty: 60 | Fill #0

## 2017-10-21 NOTE — Progress Notes (Signed)
Patient received discharge information and acknowledged understanding of it. Patient received medication from our pharmacy. Patient IVs were removed.

## 2017-10-21 NOTE — Progress Notes (Addendum)
Progress Note  Patient Name: Alec Johnson Date of Encounter: 10/21/2017  Primary Cardiologist: No primary care provider on file.  Subjective   Patient has mild right chest wall pain that is improving, reamains reproducible on palpation. Denies other chest pain or SOB. R radial cath site looks good.  Inpatient Medications    Scheduled Meds: . aspirin EC  81 mg Oral Daily  . atorvastatin  80 mg Oral q1800  . cholecalciferol  5,000 Units Oral Once per day on Mon Wed Fri  . finasteride  5 mg Oral q morning - 10a  . fluticasone  2 spray Each Nare QHS  . lisinopril  10 mg Oral Daily  . sodium chloride flush  3 mL Intravenous Q12H  . ticagrelor  90 mg Oral BID   Continuous Infusions: . sodium chloride    . sodium chloride     PRN Meds: sodium chloride, acetaminophen, acetaminophen, ALPRAZolam, MUSCLE RUB, nitroGLYCERIN, ondansetron (ZOFRAN) IV, sodium chloride flush, zolpidem   Vital Signs    Vitals:   10/20/17 1845 10/20/17 2052 10/20/17 2211 10/21/17 0532  BP: (!) 142/86 139/77 (!) 154/68 128/80  Pulse: (!) 47 (!) 51  60  Resp: 18 18 18 18   Temp: 97.9 F (36.6 C) 97.7 F (36.5 C) 97.7 F (36.5 C) 98.7 F (37.1 C)  TempSrc: Oral Oral Oral Oral  SpO2: 98% 95% 97% 90%  Weight: 105.6 kg   103.7 kg  Height: 5\' 10"  (1.778 m)       Intake/Output Summary (Last 24 hours) at 10/21/2017 0742 Last data filed at 10/20/2017 2211 Gross per 24 hour  Intake 720 ml  Output 1251 ml  Net -531 ml   Filed Weights   10/20/17 0500 10/20/17 1845 10/21/17 0532  Weight: 105.4 kg 105.6 kg 103.7 kg    Telemetry    Sinus Bradycardia, Rare PVCs  - Personally Reviewed  ECG    N/A - Personally Reviewed  Physical Exam   GEN: No acute distress.   Neck: No JVD Cardiac: RRR, no murmurs, rubs, or gallops.  Respiratory: Clear to auscultation bilaterally. GI: Soft, nontender, non-distended  MS: No edema; No deformity; R radial cath site clean and dry without hematoma; Right chest  mildly tender to palpation Neuro:  Nonfocal  Psych: Normal affect   Labs    Chemistry Recent Labs  Lab 10/19/17 1118 10/20/17 0232 10/21/17 0333  NA 140 141 139  K 4.0 3.8 3.6  CL 103 106 109  CO2 26 28 25   GLUCOSE 135* 110* 99  BUN 17 9 12   CREATININE 0.96 0.88 0.88  CALCIUM 9.3 8.7* 8.2*  GFRNONAA >60 >60 >60  GFRAA >60 >60 >60  ANIONGAP 11 7 5      Hematology Recent Labs  Lab 10/19/17 1118 10/20/17 0232 10/21/17 0333  WBC 7.6 8.7 7.5  RBC 4.72 4.13* 4.10*  HGB 15.0 13.1 13.1  HCT 44.2 40.2 40.0  MCV 93.6 97.3 97.6  MCH 31.8 31.7 32.0  MCHC 33.9 32.6 32.8  RDW 12.7 12.4 12.4  PLT 192 172 168    Cardiac Enzymes Recent Labs  Lab 10/19/17 1118 10/19/17 1635 10/19/17 2019 10/20/17 0232  TROPONINI 1.01* 7.22* 12.61* 12.69*   No results for input(s): TROPIPOC in the last 168 hours.   BNPNo results for input(s): BNP, PROBNP in the last 168 hours.   DDimer No results for input(s): DDIMER in the last 168 hours.   Radiology    Dg Chest [facility] 1 View  Result  Date: 10/19/2017 CLINICAL DATA:  Right-sided chest pain and shortness of breath. History of MI with revascularization on October 19, 2017. EXAM: PORTABLE CHEST 1 VIEW COMPARISON:  CT scan of the chest of November 24, 2016 for lung cancer screening. FINDINGS: The lungs are adequately inflated. The interstitial markings are coarse. The cardiac silhouette is enlarged. The pulmonary vascularity is mildly prominent centrally. The mediastinum is normal in width. There is no pleural effusion. IMPRESSION: Mild interstitial edema. Cardiomegaly with mild central pulmonary vascular congestion. Electronically Signed   By: David  Martinique M.D.   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 Wall thickness was   increased in a  pattern of mild LVH. Systolic function was   vigorous. The estimated ejection fraction was in the range of 65%   to 70%. Wall motion was normal; there were no regional wall   motion abnormalities. Features are consistent with a pseudonormal   left ventricular filling pattern, with concomitant abnormal   relaxation and increased filling pressure (grade 2 diastolic   dysfunction). - Left atrium: The atrium was moderately dilated. - Right atrium: The atrium was mildly dilated.  Patient Profile     67 y.o. male Presenting as code STEMI from [facility], s/p 10-20 sec CPR for possible arrest in their ED.  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 S/P DES placement w/ 0% residual stenosis. Doing well today, BB stopped yesterday due to baseline bradycardia. Hopefully home today. - DAPT (ASA/Brilinta) - Atorvastatin 80mg  Daily - Lisinopril 10 Daily  2) Asymptomatic Bradycardia: Remains in sinus brady without symtpoms; again to 30s overnight (with BB having been held) w/ rare PVCs, no significant pauses or symptoms.  - Beta Blocker discontinued  3) HTN: Previously on amlodipine. Switched to Lisinopril for risk reduction.  For questions or updates, please contact [place] Please consult [email or web address] for contact info under     Signed, Neva Seat, MD  10/21/2017, 7:42 AM    I have examined the patient and reviewed assessment and plan and discussed with patient.  Agree with above as stated.   Patient feels well.  He did well walking. Right radial site is intact.  Plan for discharge on aggresssive secondary prevention.   CTO of the RCA was not causing sx.  WOUld plan on continued medical therapy. No plans for intervention.    Larae Grooms

## 2017-10-21 NOTE — Progress Notes (Signed)
CARDIAC REHAB PHASE I   PRE:  Rate/Rhythm: 53 SB    BP: sitting 131/63    SaO2:   MODE:  Ambulation: 940 ft   POST:  Rate/Rhythm: 66 SR    BP: sitting 152/86     SaO2:   Tolerated well, no c/o. Reviewed ed. [id number]   [place], ACSM 10/21/2017 10:24 AM

## 2017-10-21 NOTE — Discharge Summary (Addendum)
Discharge Summary    Patient ID: Alec Johnson,  MRN: [id number], DOB/AGE: January 18, 1951 67 y.o.  Admit date: 10/19/2017 Discharge date: 10/21/2017  Primary Care Provider: Katherina Mires Primary Cardiologist: Dr. Stanford Breed   Discharge Diagnoses    Active Problems:   Hyperlipidemia   Bradycardia   STEMI (ST elevation myocardial infarction) ([place])   Allergies Allergies  Allergen Reactions  . Doxycycline Hyclate Rash  . Penicillins Itching and Rash  . Rifampin Rash    Diagnostic Studies/Procedures    Cath: 10/19/17   Prox Cx to Mid Cx lesion is 25% stenosed.  Mid RCA lesion is 100% stenosed. CTO with left to right collaterals. RCA difficult to engage from right radial approach.  The left ventricular systolic function is normal.  LV end diastolic pressure is normal.  The left ventricular ejection fraction is 55-65% by visual estimate.  There is no aortic valve stenosis.  Ost Ramus to Ramus lesion is 100% stenosed. Culprit for STEMI.  A drug-eluting stent was successfully placed using a STENT SYNERGY DES [id number].  Post intervention, there is a 0% residual stenosis.     Recommend uninterrupted dual antiplatelet therapy with Aspirin 81mg  daily and Ticagrelor 90mg  twice daily for a minimum of 12 months (ACS - Class I recommendation).   Watch in ICU. Start aggressive secondary prevention.   TTE: 10/20/17  Study Conclusions  - Left ventricle: The cavity size was normal. Wall thickness was   increased in a pattern of mild LVH. Systolic function was   vigorous. The estimated ejection fraction was in the range of 65%   to 70%. Wall motion was normal; there were no regional wall   motion abnormalities. Features are consistent with a pseudonormal   left ventricular filling pattern, with concomitant abnormal   relaxation and increased filling pressure (grade 2 diastolic   dysfunction). - Left atrium: The atrium was moderately dilated. - Right atrium: The atrium  was mildly dilated. _____________   History of Present Illness     Alec Johnson is a 67 yo male with PMH of asymptomatic bradycardia, HL and GERD. He was seen by Dr. Stanford Breed back in 2013 in regards to bradycardia and underwent an ETT which was normal. Sees a PCP for chronic illnesses. Presented to [facility] the day of admission with chest pain that started back on Friday and continued into Saturday. Symptoms reoccurred the day of admission and he presented to [facility]. EKG there showed ST elevation in inferior leads with depression in septal leads. Given ongoing pain he was brought to the cath lab for emergent cardiac cath. Prior to leaving the department he did have brief episode of CPR with reported asystole. Reported to last around 10-20 seconds. Also given ASA and IV heparin prior to arrival.   Hospital Course     Underwent cardiac cath noted above with PCI/DES x1 to the ramus, with CTO of the mRCA with left to right collaterals. EF noted at 55-65% on LV gram. Plan for DAPT with ASA/Brilinta for at least 12 months. Troponin peaked at 12.69. LDL noted at 76, and started on high dose statin. Unable to add BB given his low HR. Echo showed normal EF with WMA, and G2DD. He was started on lisinopril 10mg  daily with blood pressures stable. Home amlodipine was held. Worked well with cardiac rehab without recurrent chest pain.   Alec Johnson was seen by Dr. Irish Lack and determined stable for discharge home. Follow up in the office has been arranged. Medications  are listed below.   _____________  Discharge Vitals Blood pressure (!) 158/77, pulse 60, temperature 98.7 F (37.1 C), temperature source Oral, resp. rate 18, height 5\' 10"  (1.778 m), weight 103.7 kg, SpO2 90 %.  Filed Weights   10/20/17 0500 10/20/17 1845 10/21/17 0532  Weight: 105.4 kg 105.6 kg 103.7 kg    Labs & Radiologic Studies    CBC Recent Labs    10/20/17 0232 10/21/17 0333  WBC 8.7 7.5  HGB 13.1 13.1  HCT 40.2 40.0  MCV 97.3 97.6    PLT 172 831   Basic Metabolic Panel Recent Labs    10/19/17 1635 10/20/17 0232 10/21/17 0333  NA  --  141 139  K  --  3.8 3.6  CL  --  106 109  CO2  --  28 25  GLUCOSE  --  110* 99  BUN  --  9 12  CREATININE  --  0.88 0.88  CALCIUM  --  8.7* 8.2*  MG 2.0  --   --    Liver Function Tests No results for input(s): AST, ALT, ALKPHOS, BILITOT, PROT, ALBUMIN in the last 72 hours. No results for input(s): LIPASE, AMYLASE in the last 72 hours. Cardiac Enzymes Recent Labs    10/19/17 1635 10/19/17 2019 10/20/17 0232  TROPONINI 7.22* 12.61* 12.69*   BNP Invalid input(s): POCBNP D-Dimer No results for input(s): DDIMER in the last 72 hours. Hemoglobin A1C Recent Labs    10/20/17 0232  HGBA1C 5.8*   Fasting Lipid Panel Recent Labs    10/20/17 0232  CHOL 127  HDL 34*  LDLCALC 76  TRIG 87  CHOLHDL 3.7   Thyroid Function Tests Recent Labs    10/19/17 1635  TSH 0.633   _____________  Dg Chest Port 1 View  Result Date: 10/19/2017 CLINICAL DATA:  Right-sided chest pain and shortness of breath. History of MI with revascularization on October 19, 2017. EXAM: PORTABLE CHEST 1 VIEW COMPARISON:  CT scan of the chest of November 24, 2016 for lung cancer screening. FINDINGS: The lungs are adequately inflated. The interstitial markings are coarse. The cardiac silhouette is enlarged. The pulmonary vascularity is mildly prominent centrally. The mediastinum is normal in width. There is no pleural effusion. IMPRESSION: Mild interstitial edema. Cardiomegaly with mild central pulmonary vascular congestion. Electronically Signed   By: David  Martinique M.D.   On: 10/19/2017 14:50   Disposition   Pt is being discharged home today in good condition.  Follow-up Plans & Appointments    Follow-up Information    Erlene Quan, PA-C Follow up on 10/29/2017.   Specialties:  Cardiology, Radiology Why:  at 11am for your follow up appt.  Contact information: [address] [place] [address] [phone]          Discharge Instructions    Amb Referral to Cardiac Rehabilitation   Complete by:  As directed    Diagnosis:   Coronary Stents STEMI PTCA     Call MD for:  redness, tenderness, or signs of infection (pain, swelling, redness, odor or green/yellow discharge around incision site)   Complete by:  As directed    Diet - low sodium heart healthy   Complete by:  As directed    Discharge instructions   Complete by:  As directed    Radial Site Care Refer to this sheet in the next few weeks. These instructions provide you with information on caring for yourself after your procedure. Your caregiver may also  give you more specific instructions. Your treatment has been planned according to current medical practices, but problems sometimes occur. Call your caregiver if you have any problems or questions after your procedure. HOME CARE INSTRUCTIONS You may shower the day after the procedure.Remove the bandage (dressing) and gently wash the site with plain soap and water.Gently pat the site dry.  Do not apply powder or lotion to the site.  Do not submerge the affected site in water for 3 to 5 days.  Inspect the site at least twice daily.  Do not flex or bend the affected arm for 24 hours.  No lifting over 5 pounds (2.3 kg) for 10 days after your procedure.  Do not drive home if you are discharged the same day of the procedure. Have someone else drive you.  You may drive 24 hours after the procedure unless otherwise instructed by your caregiver.  What to expect: Any bruising will usually fade within 1 to 2 weeks.  Blood that collects in the tissue (hematoma) may be painful to the touch. It should usually decrease in size and tenderness within 1 to 2 weeks.  SEEK IMMEDIATE MEDICAL CARE IF: You have unusual pain at the radial site.  You have redness, warmth, swelling, or pain at the radial site.  You have drainage (other than a small amount of blood on  the dressing).  You have chills.  You have a fever or persistent symptoms for more than 72 hours.  You have a fever and your symptoms suddenly get worse.  Your arm becomes pale, cool, tingly, or numb.  You have heavy bleeding from the site. Hold pressure on the site.   PLEASE DO NOT MISS ANY DOSES OF YOUR BRILINTA!!!!! Also keep a log of you blood pressures and bring back to your follow up appt. Please call the office with any questions.   Patients taking blood thinners should generally stay away from medicines like ibuprofen, Advil, Motrin, naproxen, and Aleve due to risk of stomach bleeding. You may take Tylenol as directed or talk to your primary doctor about alternatives.   Increase activity slowly   Complete by:  As directed        Discharge Medications     Medication List    STOP taking these medications   amLODipine 5 MG tablet Commonly known as:  NORVASC   HYDROmorphone 2 MG tablet Commonly known as:  DILAUDID   meloxicam 15 MG tablet Commonly known as:  MOBIC   methocarbamol 500 MG tablet Commonly known as:  ROBAXIN   rivaroxaban 10 MG Tabs tablet Commonly known as:  XARELTO   simvastatin 80 MG tablet Commonly known as:  ZOCOR     TAKE these medications   acetaminophen 500 MG tablet Commonly known as:  TYLENOL Take 1,000 mg by mouth every 6 (six) hours as needed for mild pain or moderate pain.   aspirin EC 81 MG tablet Take 81 mg by mouth daily.   atorvastatin 80 MG tablet Commonly known as:  LIPITOR Take 1 tablet (80 mg total) by mouth daily at 6 PM. What changed:    medication strength  how much to take  when to take this   finasteride 5 MG tablet Commonly known as:  PROSCAR Take 5 mg by mouth every morning.   fluticasone 50 MCG/ACT nasal spray Commonly known as:  FLONASE Place 2 sprays into both nostrils daily.   lisinopril 10 MG tablet Commonly known as:  PRINIVIL,ZESTRIL Take 1 tablet (10 mg  total) by mouth daily. Start taking on:   10/22/2017   MUSCLE RUB 10-15 % Crea Apply 1 application topically as needed for muscle pain (back pain).   nitroGLYCERIN 0.4 MG SL tablet Commonly known as:  NITROSTAT Place 1 tablet (0.4 mg total) under the tongue every 5 (five) minutes x 3 doses as needed for chest pain.   ticagrelor 90 MG Tabs tablet Commonly known as:  BRILINTA Take 1 tablet (90 mg total) by mouth 2 (two) times daily.   Vitamin D3 5000 units Tabs Take 5,000 Units by mouth 3 (three) times a week.       Acute coronary syndrome (MI, NSTEMI, STEMI, etc) this admission?: Yes.     AHA/ACC Clinical Performance & Quality Measures: 1. Aspirin prescribed? - Yes 2. ADP Receptor Inhibitor (Plavix/Clopidogrel, Brilinta/Ticagrelor or Effient/Prasugrel) prescribed (includes medically managed patients)? - Yes 3. Beta Blocker prescribed? - No - bradycardic 4. High Intensity Statin (Lipitor 40-80mg  or Crestor 20-40mg ) prescribed? - Yes 5. EF assessed during THIS hospitalization? - Yes 6. For EF <40%, was ACEI/ARB prescribed? - Yes 7. For EF <40%, Aldosterone Antagonist (Spironolactone or Eplerenone) prescribed? - Not Applicable (EF >/= 60%) 8. Cardiac Rehab Phase II ordered (Included Medically managed Patients)? - Yes    Outstanding Labs/Studies   FLP/LFTs in 6 weeks if tolerating statin.   Duration of Discharge Encounter   Greater than 30 minutes including physician time.  Signed, Reino Bellis NP-C 10/21/2017, 11:03 AM  I have examined the patient and reviewed assessment and plan and discussed with patient.  Agree with above as stated.   Patient feels well.  He did well walking. Right radial site is intact.  Plan for discharge on aggresssive secondary prevention.   CTO of the RCA was not causing sx.  WOUld plan on continued medical therapy. No plans for intervention.    No beta blolcker due to bradycardia.  HTN med changed to Lisinopril.   Alec Johnson

## 2017-10-21 NOTE — Telephone Encounter (Signed)
Pt c/o medication issue:  1. Name of Medication: MELOXICAM  15mg    2. How are you currently taking this medication (dosage and times per day)? Per discharge Stop taking this pt has a question about this  3. Are you having a reaction (difficulty breathing--STAT)? NOME  4. What is your medication issue? Pt stated has question about this medication

## 2017-10-21 NOTE — Telephone Encounter (Signed)
Returned call to patient-patient states he got home from hospital and he is trying to get him medications straight.  He noticed that it said to stop taking his meloxicam.   He was not aware of this and has to take this for his arthritis.   He has been on this for years.     Advised this medications is not advised as he is on Brilinta and ASA.   He has been advised to contact his rheumatologist to discuss alternatives.  He is aware and verbalized understanding.

## 2017-10-22 ENCOUNTER — Telehealth (HOSPITAL_COMMUNITY): Payer: Self-pay

## 2017-10-22 NOTE — Telephone Encounter (Signed)
Made initial call to patient in regards to Cardiac Rehab - Patient stated he is interested. Explained scheduling process, went over insurance, and patient verbalized understanding. Will contact patient for scheduling once f/u appt has been completed.

## 2017-10-22 NOTE — Telephone Encounter (Signed)
Patients insurance is active and benefits verified through Medicare A/B - No co-pay, deductible amount of $185.00/$185.00 has been met, no out of pocket, 20% co-insurance, and no pre-authorization is required. Passport/reference [id number]  Patients insurance is active and benefits verified through [place] [employer] [place] - No co-pay, no deductible, no out of pocket, no co-insurance, and no pre-authorization is required. Passport/reference [id number] Patient is covered at 100%.  Will contact patient in regards to Cardiac Rehab. If patient is interested in the program, patient will need to complete follow up appt. Once completed, patient will be contacted for scheduling.

## 2017-10-29 ENCOUNTER — Ambulatory Visit (INDEPENDENT_AMBULATORY_CARE_PROVIDER_SITE_OTHER): Payer: Medicare Other | Admitting: Cardiology

## 2017-10-29 ENCOUNTER — Telehealth (HOSPITAL_COMMUNITY): Payer: Self-pay | Admitting: *Deleted

## 2017-10-29 ENCOUNTER — Encounter: Payer: Self-pay | Admitting: Cardiology

## 2017-10-29 DIAGNOSIS — I10 Essential (primary) hypertension: Secondary | ICD-10-CM

## 2017-10-29 DIAGNOSIS — E785 Hyperlipidemia, unspecified: Secondary | ICD-10-CM

## 2017-10-29 DIAGNOSIS — R001 Bradycardia, unspecified: Secondary | ICD-10-CM

## 2017-10-29 DIAGNOSIS — I2129 ST elevation (STEMI) myocardial infarction involving other sites: Secondary | ICD-10-CM

## 2017-10-29 DIAGNOSIS — I2510 Atherosclerotic heart disease of native coronary artery without angina pectoris: Secondary | ICD-10-CM

## 2017-10-29 DIAGNOSIS — Z9861 Coronary angioplasty status: Secondary | ICD-10-CM

## 2017-10-29 NOTE — Assessment & Plan Note (Signed)
RI PCI in the setting of STEMI 10/19/17. Residual occluded mRCA with collaterals

## 2017-10-29 NOTE — Progress Notes (Signed)
10/29/2017 Alec Johnson   03-Dec-1950  [id number]  Primary Physician Doreene Nest, Jannifer Rodney, MD Primary Cardiologist: Dr Stanford Breed ([place] 2013)  HPI:   67 yo male with PMH of asymptomatic bradycardia, seen in 2013 by Dr Stanford Breed. The pt had a negative GXT then. Other problems include a FM Hx of CAD (bro had an MI) HL, DJD, and GERD who presented to [facility] 10/19/17 with 48 hours of intermittent chest pain. Code STEMI called and transferred to [facility] for emergent cardiac cath. Cath revealed the culprit vessel to be an occluded RI which was treated with a DES. The pt did have breif CPR-less than 30 seconds. The pt also had an occluded mRCA with collaterals from the CFX and LAD. His LVF was 55%. Echo showed LVH with grade 2DD. He has baseline bradycardia and was not discharged on a beta blocker. His LFL was 76. He is in the office today for follow up. He has been doing well, no further chest pain. He is concerned he may not be able to afford Brilinta as he is in the "donut hole" and it will cost him 350 dollars for a month until Jan.    Current Outpatient Medications  Medication Sig Dispense Refill  . acetaminophen (TYLENOL) 500 MG tablet Take 1,000 mg by mouth every 6 (six) hours as needed for mild pain or moderate pain.    Marland Kitchen aspirin EC 81 MG tablet Take 81 mg by mouth daily.    Marland Kitchen atorvastatin (LIPITOR) 80 MG tablet Take 1 tablet (80 mg total) by mouth daily at 6 PM. 90 tablet 1  . Cholecalciferol (VITAMIN D3) 5000 units TABS Take 5,000 Units by mouth 3 (three) times a week.     Marland Kitchen lisinopril (PRINIVIL,ZESTRIL) 10 MG tablet Take 1 tablet (10 mg total) by mouth daily. 30 tablet 1  . ticagrelor (BRILINTA) 90 MG TABS tablet Take 1 tablet (90 mg total) by mouth 2 (two) times daily. 60 tablet 0  . fluticasone (FLONASE) 50 MCG/ACT nasal spray Place 2 sprays into both nostrils daily. 48 g 0  . nitroGLYCERIN (NITROSTAT) 0.4 MG SL tablet Place 1 tablet (0.4 mg total) under the tongue every 5 (five) minutes x 3 doses as  needed for chest pain. (Patient not taking: Reported on 10/29/2017) 25 tablet 1   No current facility-administered medications for this visit.     Allergies  Allergen Reactions  . Doxycycline Hyclate Rash  . Penicillins Itching and Rash  . Rifampin Rash    Past Medical History:  Diagnosis Date  . Colon polyp   . Diverticular disease   . Erectile dysfunction   . GERD (gastroesophageal reflux disease)    NO Recent problems  . Hyperlipidemia   . Hyperlipidemia   . Osteoarthritis   . Seborrheic keratosis   . STEMI (ST elevation myocardial infarction) ([place])    10/19/17 PCI/DES to ramus with CTO of the mRCA with collaterals, normal EF    Social History   Socioeconomic History  . Marital status: Married    Spouse name: Alec Johnson  . Number of children: 2  . Years of education: 64  . Highest education level: Not on file  Occupational History  . Occupation: RETIRED     Employer: [employer]  Social Needs  . Financial resource strain: Not on file  . Food insecurity:    Worry: Not on file    Inability: Not on file  . Transportation needs:    Medical: Not on file  Non-medical: Not on file  Tobacco Use  . Smoking status: Former Smoker    Packs/day: 2.00    Years: 45.00    Pack years: 90.00    Types: Cigarettes    Last attempt to quit: 01/11/2012    Years since quitting: 5.8  . Smokeless tobacco: Never Used  . Tobacco comment: He has quit smoking.    Substance and Sexual Activity  . Alcohol use: Yes    Comment: Occasional  . Drug use: No  . Sexual activity: Yes  Lifestyle  . Physical activity:    Days per week: Not on file    Minutes per session: Not on file  . Stress: Not on file  Relationships  . Social connections:    Talks on phone: Not on file    Gets together: Not on file    Attends religious service: Not on file    Active member of club or organization: Not on file    Attends meetings of clubs or organizations: Not on file    Relationship status: Not  on file  . Intimate partner violence:    Fear of current or ex partner: Not on file    Emotionally abused: Not on file    Physically abused: Not on file    Forced sexual activity: Not on file  Other Topics Concern  . 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.     Education:  [employer]    Tobacco Use/Exposure: He has quit smoking.        Alcohol Use: Occasional    Drug Use:  None   Diet:  Regular   Exercise:  He was walking daily until he hurt his hip.     Hobbies:  Fishing, Gardening, Golf      Family History  Problem Relation Age of Onset  . Coronary artery disease Brother        MI  . Heart attack Brother   . Stroke Mother   . Cancer Father        Lung Cancer  . Cancer Paternal Grandfather        Lung Cancer  . Heart attack Paternal Grandfather      Review of Systems: General: negative for chills, fever, night sweats or weight changes.  Cardiovascular: negative for chest pain, dyspnea on exertion, edema, orthopnea, palpitations, paroxysmal nocturnal dyspnea or shortness of breath Dermatological: negative for rash Respiratory: negative for cough or wheezing Urologic: negative for hematuria Abdominal: negative for nausea, vomiting, diarrhea, bright red blood per rectum, melena, or hematemesis Neurologic: negative for visual changes, syncope, or dizziness All other systems reviewed and are otherwise negative except as noted above.    Blood pressure 118/70, pulse (!) 49, height 5\' 10"  (1.778 m), weight 225 lb 12.8 oz (102.4 kg).  General appearance: alert, cooperative, no distress and mildly obese Neck: no carotid bruit and no JVD Lungs: clear to auscultation bilaterally Heart: regular rate and rhythm Extremities: no edema, mild ecchymosis Rt radial site Skin:  Skin color, texture, turgor normal. No rashes or lesions Neurologic: Grossly normal  EKG NSR-HR 49  ASSESSMENT AND PLAN:   STEMI (ST elevation myocardial infarction) ([place]) Presented with STEMI 10/19/17  CAD S/P RI PCI RI PCI in the setting of STEMI 10/19/17. Residual occluded mRCA with collaterals  Essential hypertension EF 65-70% with mild LVH and grade 2 DD by echo Sept 2019 Norvasc changed to lisinopril after MI  Dyslipidemia, goal LDL below 70 LDL 76 Oct 2019   PLAN  Same Rx. I told him if his B/P runs less than 820 systolic he can try taking his Lisinopril Q HS. No strenuous activity for another two weeks.   Kerin Ransom PA-C 10/29/2017 11:35 AM

## 2017-10-29 NOTE — Assessment & Plan Note (Signed)
EF 65-70% with mild LVH and grade 2 DD by echo Sept 2019 Norvasc changed to lisinopril after MI

## 2017-10-29 NOTE — Telephone Encounter (Signed)
Clinical review of pt follow up appt on 10/29/17 cardiologist office note with Kerin Ransom, PA. Pt is making the expected progress in recovery.  Pt appropriate for scheduling for cardiac rehab.  Will forward to support staff for scheduling with pt  consent.  [name]

## 2017-10-29 NOTE — Assessment & Plan Note (Signed)
LDL 76 Oct 2019

## 2017-10-29 NOTE — Assessment & Plan Note (Signed)
Presented with STEMI 10/19/17

## 2017-10-29 NOTE — Patient Instructions (Signed)
Medication Instructions:  Kerin Ransom, PA-C recommends that you continue on your current medications as directed. Please refer to the Current Medication list given to you today.  If you need a refill on your cardiac medications before your next appointment, please call your pharmacy.   Lab work: NONE ORDERED  If you have labs (blood work) drawn today and your tests are completely normal, you will receive your results only by: Marland Kitchen MyChart Message (if you have MyChart) OR . A paper copy in the mail If you have any lab test that is abnormal or we need to change your treatment, we will call you to review the results.  Testing/Procedures: NONE ORDERED  Follow-Up: At [facility], you and your health needs are our priority.  As part of our continuing mission to provide you with exceptional heart care, we have created designated Provider Care Teams.  These Care Teams include your primary Cardiologist (physician) and Advanced Practice Providers (APPs -  Physician Assistants and Nurse Practitioners) who all work together to provide you with the care you need, when you need it. You will need a follow up appointment in 3 months.  Please call our office 2 months in advance to schedule this appointment.  You may see Kirk Ruths, MD or one of the following Advanced Practice Providers on your designated Care Team:   Kerin Ransom, PA-C Roby Lofts, [place] . Sande Rives, PA-C

## 2017-10-30 ENCOUNTER — Telehealth (HOSPITAL_COMMUNITY): Payer: Self-pay

## 2017-10-30 NOTE — Telephone Encounter (Signed)
Called and spoke with patient in regards to Cardiac Rehab - Scheduled orientation on 12/29/17 at 8:30am. Patient will attend the 2:45pm exc class. Mailed packet.

## 2017-11-02 NOTE — Addendum Note (Signed)
Addended by: Crissie Reese on: 11/02/2017 12:00 PM   Modules accepted: Orders

## 2017-11-13 ENCOUNTER — Telehealth: Payer: Self-pay | Admitting: Cardiology

## 2017-11-13 NOTE — Telephone Encounter (Signed)
Spoke with pt who states since starting Brilinta he has been SOB. He states he has an appointment with Kerin Ransom, PA on 11/26/17 to discuss transition to Plavix. Pt states he was told to stay on it for 45 days and then will be able to switch. Pt states he will continue to take until appointment with [facility].

## 2017-11-13 NOTE — Telephone Encounter (Signed)
(  smartphrases not working)  Patient called stating he has a little SOB, while taking ticagrelor (BRILINTA). He stated it's more for a few hours after taking the medication.  Patient has appt with Kerin Ransom on 11/7 as he requested to discuss his medication.

## 2017-11-26 ENCOUNTER — Encounter: Payer: Self-pay | Admitting: Cardiology

## 2017-11-26 ENCOUNTER — Ambulatory Visit (INDEPENDENT_AMBULATORY_CARE_PROVIDER_SITE_OTHER): Payer: Medicare Other | Admitting: Cardiology

## 2017-11-26 ENCOUNTER — Ambulatory Visit (HOSPITAL_BASED_OUTPATIENT_CLINIC_OR_DEPARTMENT_OTHER)
Admission: RE | Admit: 2017-11-26 | Discharge: 2017-11-26 | Disposition: A | Discharge status: Home / Self Care | Payer: Medicare Other | Source: Ambulatory Visit | Attending: Acute Care | Admitting: Acute Care

## 2017-11-26 VITALS — BP 153/80 | HR 65 | Resp 16 | Ht 70.0 in | Wt 231.8 lb

## 2017-11-26 DIAGNOSIS — J439 Emphysema, unspecified: Secondary | ICD-10-CM | POA: Diagnosis not present

## 2017-11-26 DIAGNOSIS — I700 Atherosclerosis of aorta: Secondary | ICD-10-CM | POA: Insufficient documentation

## 2017-11-26 DIAGNOSIS — Z122 Encounter for screening for malignant neoplasm of respiratory organs: Secondary | ICD-10-CM | POA: Insufficient documentation

## 2017-11-26 DIAGNOSIS — I10 Essential (primary) hypertension: Secondary | ICD-10-CM

## 2017-11-26 DIAGNOSIS — I2510 Atherosclerotic heart disease of native coronary artery without angina pectoris: Secondary | ICD-10-CM | POA: Diagnosis not present

## 2017-11-26 DIAGNOSIS — E785 Hyperlipidemia, unspecified: Secondary | ICD-10-CM | POA: Diagnosis not present

## 2017-11-26 DIAGNOSIS — Z9861 Coronary angioplasty status: Secondary | ICD-10-CM | POA: Diagnosis not present

## 2017-11-26 DIAGNOSIS — Z87891 Personal history of nicotine dependence: Secondary | ICD-10-CM

## 2017-11-26 DIAGNOSIS — R001 Bradycardia, unspecified: Secondary | ICD-10-CM

## 2017-11-26 DIAGNOSIS — I252 Old myocardial infarction: Secondary | ICD-10-CM

## 2017-11-26 DIAGNOSIS — R0600 Dyspnea, unspecified: Secondary | ICD-10-CM | POA: Insufficient documentation

## 2017-11-26 IMAGING — CT CT CHEST LUNG CANCER SCREENING LOW DOSE W/O CM
2 of 4 series · 15 of 40 positions shown, 18 images · non-contrast
Comparison: [DATE] screening chest CT.

CLINICAL DATA: 67-year-old asymptomatic male former smoker with 92
pack-year smoking history.

EXAM:
CT CHEST WITHOUT CONTRAST LOW-DOSE FOR LUNG CANCER SCREENING
TECHNIQUE: Multidetector CT imaging of the chest was performed following the
standard protocol without IV contrast.

[Series 2: axial st · axial · 0.84mm/px · z∈[-309,-14]mm · 12 of 65 slices shown, 15 images]
[im 3/65  mediastinal]
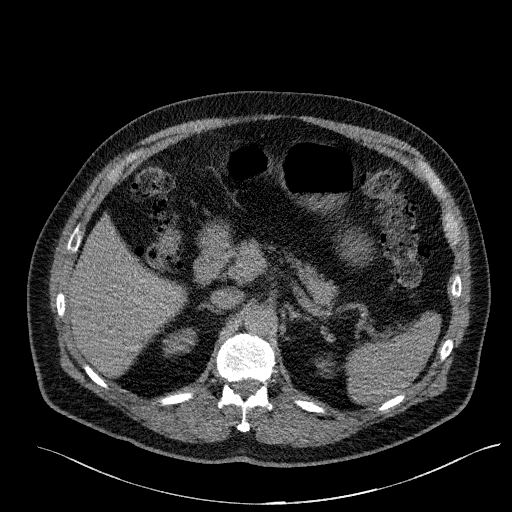
[im 3/65  lung]
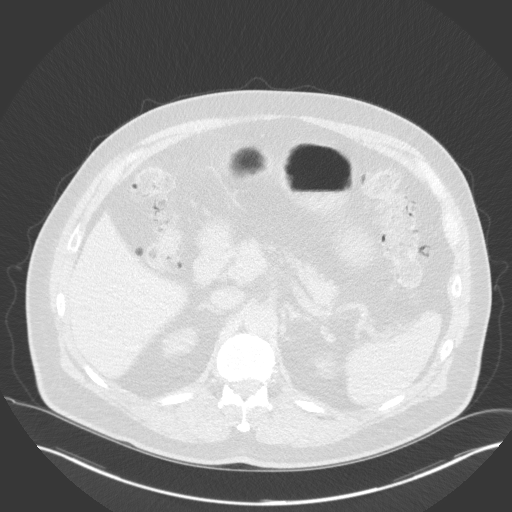
[im 9/65  lung]
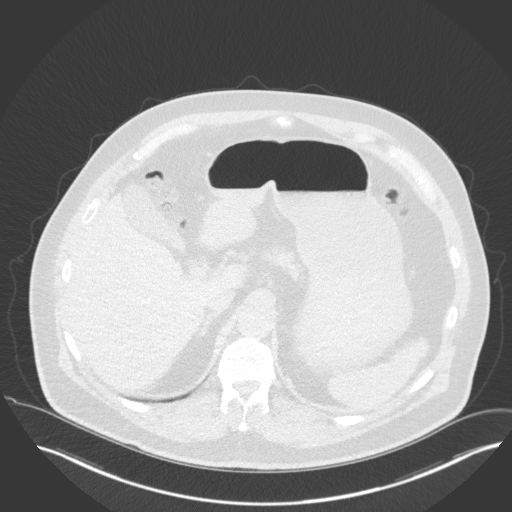
[im 14/65  lung]
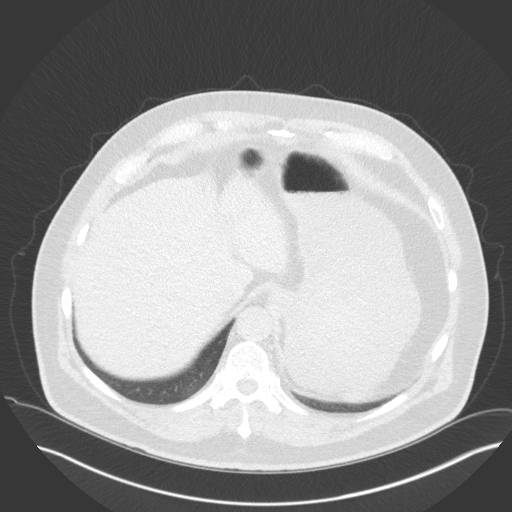
[im 20/65  lung]
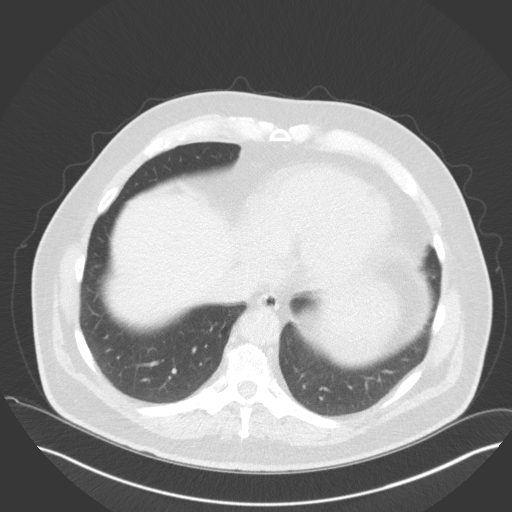
[im 26/65  mediastinal]
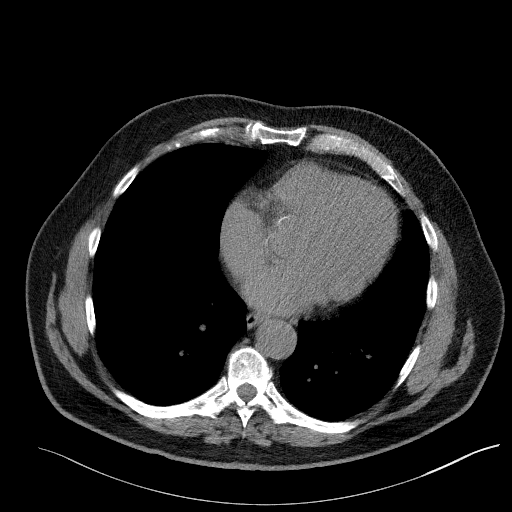
[im 26/65  lung]
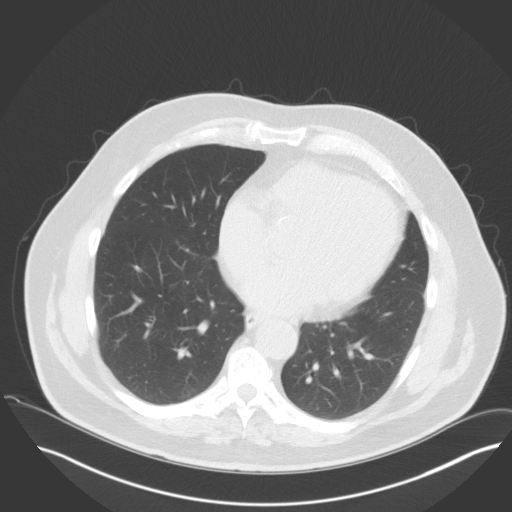
[im 31/65  lung]
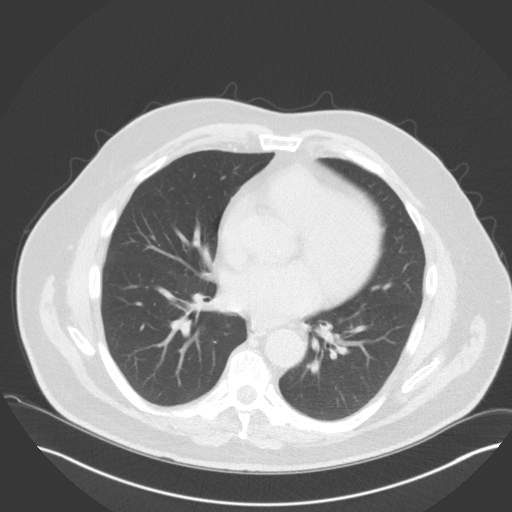
[im 34/65  lung]
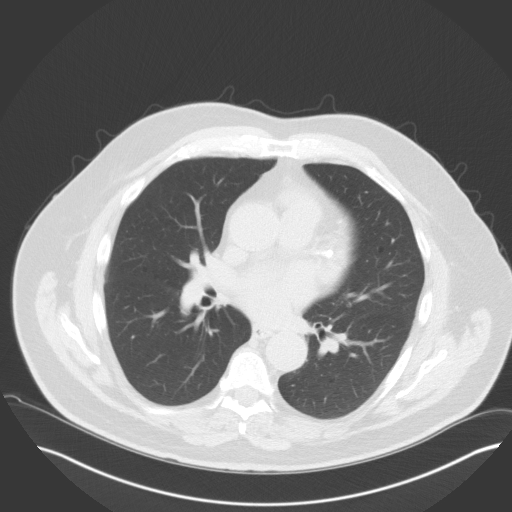
[im 39/65  lung]
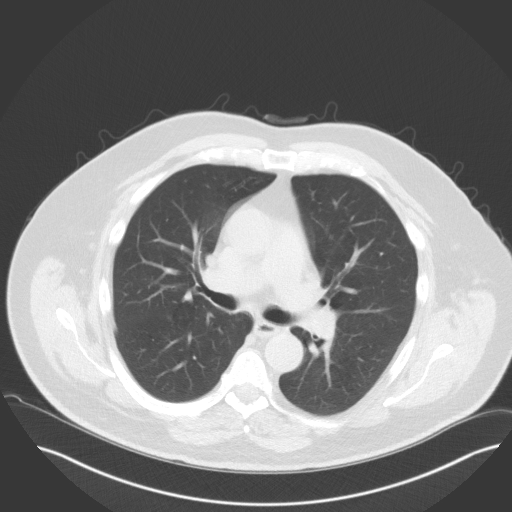
[im 45/65  mediastinal]
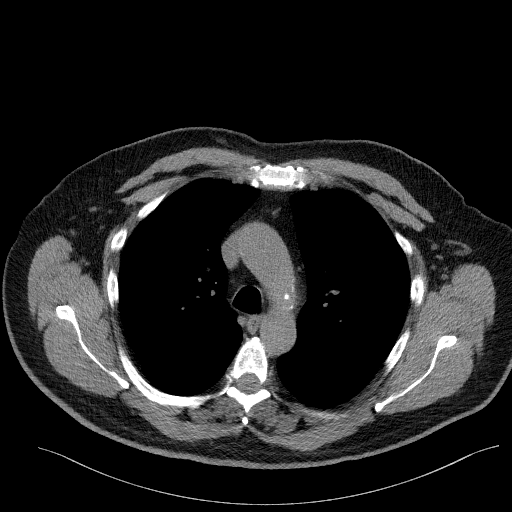
[im 45/65  lung]
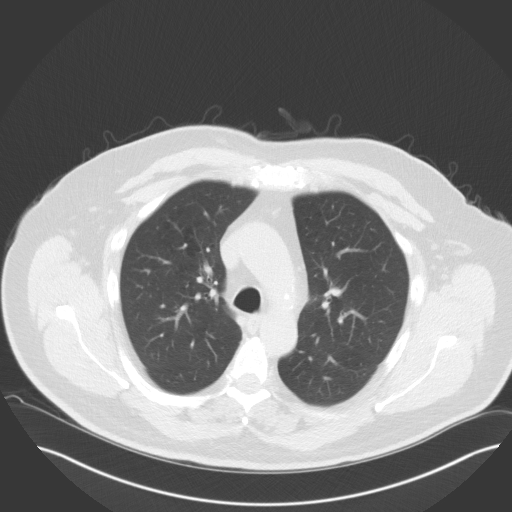
[im 51/65  lung]
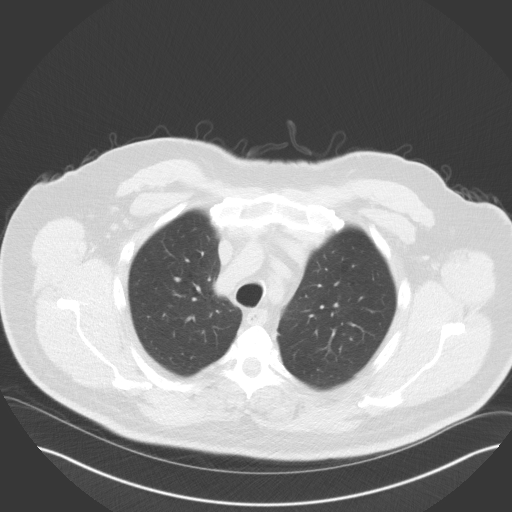
[im 56/65  lung]
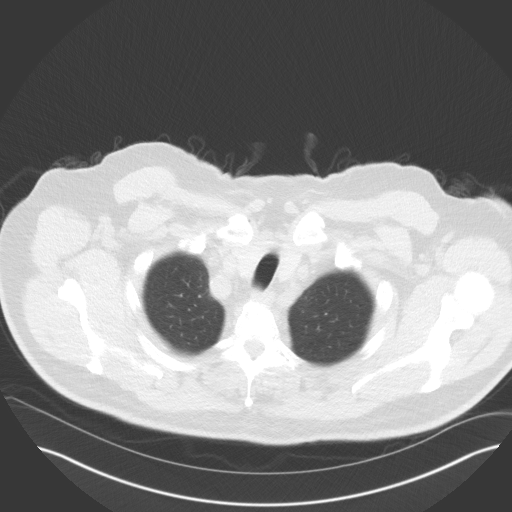
[im 62/65  lung]
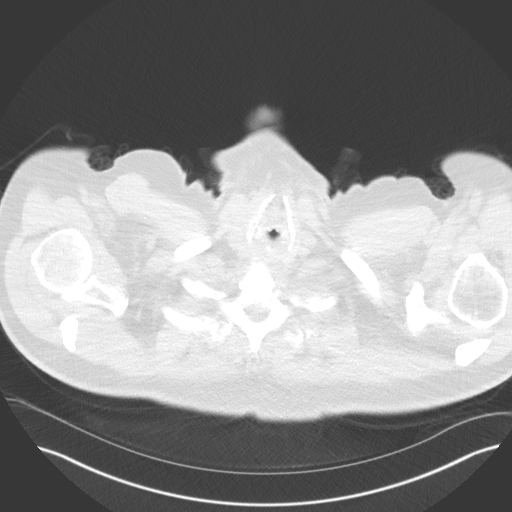

[Series 4: coronal · coronal · 0.67mm/px · 3 of 317 slices shown]
[im 64/317  lung]
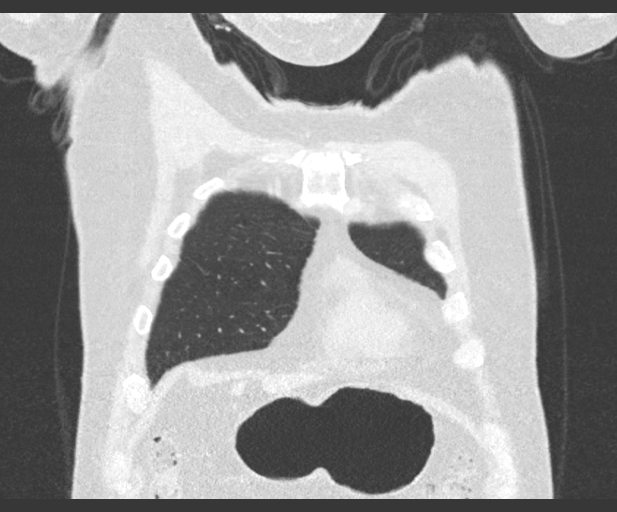
[im 127/317  lung]
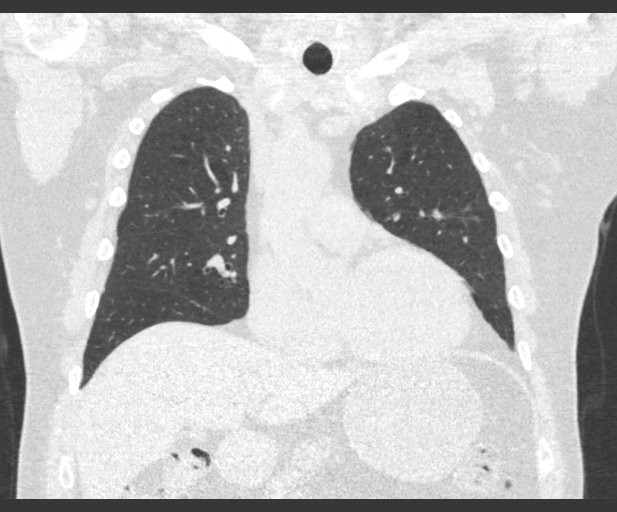
[im 190/317  lung]
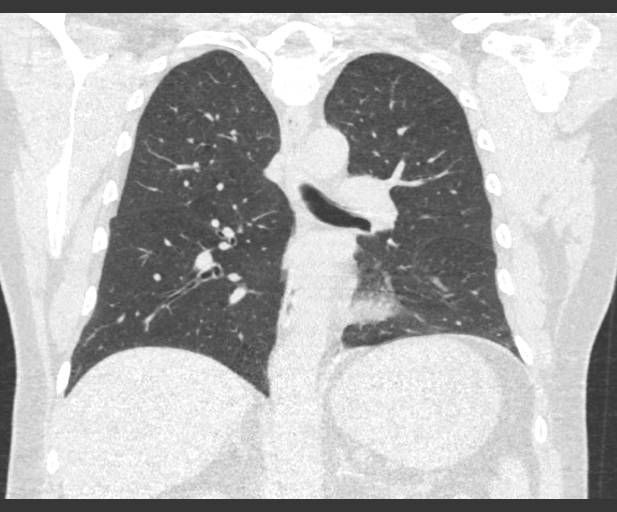

[15 of 40 positions shown; findings below may reference images not displayed]

FINDINGS: Cardiovascular: Normal heart size. No significant pericardial
effusion/thickening. Left anterior descending and right coronary
atherosclerosis. Atherosclerotic nonaneurysmal thoracic aorta.
Mildly dilated main pulmonary artery (3.5 cm diameter), stable.

Mediastinum/Nodes: No discrete thyroid nodules. Unremarkable
esophagus. No pathologically enlarged axillary, mediastinal or hilar
lymph nodes, noting limited sensitivity for the detection of hilar
adenopathy on this noncontrast study.

Lungs/Pleura: No pneumothorax. No pleural effusion. Mild
centrilobular emphysema with no acute consolidative airspace disease
or lung masses. No growth of solitary small solid pulmonary nodule
in the right lung. No new significant pulmonary nodules.

Upper abdomen: Colonic diverticulosis.

Musculoskeletal: No aggressive appearing focal osseous lesions. Mild
thoracic spondylosis.
IMPRESSION: 1. Lung-RADS 2, benign appearance or behavior. Continue annual
screening with low-dose chest CT without contrast in 12 months.
2. Stable mildly dilated main pulmonary artery, suggesting chronic
pulmonary arterial hypertension.
3. Two-vessel coronary atherosclerosis.

Aortic Atherosclerosis ([KV]-[KV]) and Emphysema ([KV]-[KV]).

## 2017-11-26 MED ORDER — CLOPIDOGREL BISULFATE 75 MG PO TABS: 75.0000 mg | ORAL_TABLET | Freq: Every day | ORAL | 3 refills | Status: DC

## 2017-11-26 MED ORDER — LISINOPRIL 10 MG PO TABS: 10.0000 mg | ORAL_TABLET | Freq: Every day | ORAL | 0 refills | Status: DC

## 2017-11-26 NOTE — Progress Notes (Signed)
11/26/2017 Alec Johnson   1950/09/04  [id number]  Primary Physician Doreene Nest, Jannifer Rodney, MD Primary Cardiologist: Dr Stanford Breed  HPI:  67 yo male with PMH of asymptomatic bradycardia, seen in 2013 by Dr Stanford Breed. The pt presented to [facility] 10/19/17 with 48 hours of intermittent chest pain. Code STEMI called and transferred to [facility] for emergent cardiac cath.Cath revealed the culprit vessel to be an occluded RI which was treated with a DES. The pt did have brief CPR-less than 30 seconds. The pt also had an occluded mRCA with collaterals from the CFX and LAD. His LVF was 55%. Echo showed LVH with grade 2DD. He has baseline bradycardia and was not discharged on a beta blocker. His LDL was 76. He was seen in the office 10/29/17.  His B/P was running a little low and I suggested he take his Lisinopril Q HS.  He called the office complaining of SOB which he attributed to [facility] and he would like to come off this if he can.     Current Outpatient Medications  Medication Sig Dispense Refill  . acetaminophen (TYLENOL) 500 MG tablet Take 1,000 mg by mouth every 6 (six) hours as needed for mild pain or moderate pain.    Marland Kitchen aspirin EC 81 MG tablet Take 81 mg by mouth daily.    Marland Kitchen atorvastatin (LIPITOR) 80 MG tablet Take 1 tablet (80 mg total) by mouth daily at 6 PM. 90 tablet 1  . Cholecalciferol (VITAMIN D3) 5000 units TABS Take 5,000 Units by mouth 3 (three) times a week.     Marland Kitchen lisinopril (PRINIVIL,ZESTRIL) 10 MG tablet Take 1 tablet (10 mg total) by mouth daily. 90 tablet 0  . nitroGLYCERIN (NITROSTAT) 0.4 MG SL tablet Place 1 tablet (0.4 mg total) under the tongue every 5 (five) minutes x 3 doses as needed for chest pain. 25 tablet 1  . clopidogrel (PLAVIX) 75 MG tablet Take 1 tablet (75 mg total) by mouth daily. 300Mg (4TAB) ON THE FIRST DAY THEN 75MG  DAILY THEREAFTER 34 tablet 3  . fluticasone (FLONASE) 50 MCG/ACT nasal spray Place 2 sprays into both nostrils daily. 48 g 0   No current  facility-administered medications for this visit.     Allergies  Allergen Reactions  . Doxycycline Hyclate Rash  . Penicillins Itching and Rash  . Rifampin Rash    Past Medical History:  Diagnosis Date  . Colon polyp   . Diverticular disease   . Erectile dysfunction   . GERD (gastroesophageal reflux disease)    NO Recent problems  . Hyperlipidemia   . Hyperlipidemia   . Osteoarthritis   . Seborrheic keratosis   . STEMI (ST elevation myocardial infarction) ([place])    10/19/17 PCI/DES to ramus with CTO of the mRCA with collaterals, normal EF    Social History   Socioeconomic History  . Marital status: Married    Spouse name: [name]  . Number of children: 2  . Years of education: 109  . Highest education level: Not on file  Occupational History  . Occupation: RETIRED     Employer: [employer]  Social Needs  . Financial resource strain: Not on file  . Food insecurity:    Worry: Not on file    Inability: Not on file  . Transportation needs:    Medical: Not on file    Non-medical: Not on file  Tobacco Use  . Smoking status: Former Smoker    Packs/day: 2.00    Years: 45.00  Pack years: 90.00    Types: Cigarettes    Last attempt to quit: 01/11/2012    Years since quitting: 5.8  . Smokeless tobacco: Never Used  . Tobacco comment: He has quit smoking.    Substance and Sexual Activity  . Alcohol use: Yes    Comment: Occasional  . Drug use: No  . Sexual activity: Yes  Lifestyle  . Physical activity:    Days per week: Not on file    Minutes per session: Not on file  . Stress: Not on file  Relationships  . Social connections:    Talks on phone: Not on file    Gets together: Not on file    Attends religious service: Not on file    Active member of club or organization: Not on file    Attends meetings of clubs or organizations: Not on file    Relationship status: Not on file  . Intimate partner violence:    Fear of current or ex partner: Not on file     Emotionally abused: Not on file    Physically abused: Not on file    Forced sexual activity: Not on file  Other Topics Concern  . 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.     Education:  [employer]    Tobacco Use/Exposure: He has quit smoking.        Alcohol Use: Occasional    Drug Use:  None   Diet:  Regular   Exercise:  He was walking daily until he hurt his hip.     Hobbies:  Fishing, Gardening, Golf      Family History  Problem Relation Age of Onset  . Coronary artery disease Brother        MI  . Heart attack Brother   . Stroke Mother   . Cancer Father        Lung Cancer  . Cancer Paternal Grandfather        Lung Cancer  . Heart attack Paternal Grandfather      Review of Systems: General: negative for chills, fever, night sweats or weight changes.  Cardiovascular: negative for chest pain, dyspnea on exertion, edema, orthopnea, palpitations, paroxysmal nocturnal dyspnea or shortness of breath Dermatological: negative for rash Respiratory: negative for cough or wheezing Urologic: negative for hematuria Abdominal: negative for nausea, vomiting, diarrhea, bright red blood per rectum, melena, or hematemesis Neurologic: negative for visual changes, syncope, or dizziness All other systems reviewed and are otherwise negative except as noted above.    Blood pressure (!) 153/80, pulse 65, resp. rate 16, height 5\' 10"  (1.778 m), weight 231 lb 12.8 oz (105.1 kg), SpO2 94 %.  General appearance: alert, cooperative and no distress Neck: no JVD Skin: Skin color, texture, turgor normal. No rashes or lesions Neurologic: Grossly normal  Repeat B/P by me 124/72  ASSESSMENT AND PLAN:   Dyspnea The patient attributes this to Brilinta and I have no reason to doubt him.    STEMI (ST elevation myocardial infarction) [facility]) Presented with STEMI 10/19/17  CAD S/P RI PCI RI PCI in the setting of STEMI 10/19/17. Residual occluded mRCA with collaterals  Essential hypertension EF 65-70% with mild LVH and grade 2 DD by echo Sept 2019 Norvasc changed to lisinopril after MI  Dyslipidemia, goal  LDL below 70 LDL 76 Oct 2019   PLAN  Discussed with dr Ellyn Hack in the office- take PM dose of Brilinta, next morning stop Brilinta and take 300 mg of Plavix x 1.  The next day start Plavix 75 mg daily.   Kerin Ransom PA-C 11/26/2017 4:59 PM

## 2017-11-26 NOTE — Patient Instructions (Signed)
Medication Instructions:  STOP BRILINTA  START PLAVIX 75MG  TAKE 300MG  (4TABS) ON THE FIRST DAY THEN 75MG  DAILY THEREAFTER   If you need a refill on your cardiac medications before your next appointment, please call your pharmacy.  Labwork: If you have labs (blood work) drawn today and your tests are completely normal, you will receive your results only by: Marland Kitchen MyChart Message (if you have MyChart) OR . A paper copy in the mail If you have any lab test that is abnormal or we need to change your treatment, we will call you to review the results.  Follow-Up: You will need a follow up appointment in [place]. You may see  DR Stanford Breed,  or one of the following Advanced Practice Providers on your designated Care Team:  Kerin Ransom, [place]  At [facility], you and your health needs are our priority.  As part of our continuing mission to provide you with exceptional heart care, we have created designated Provider Care Teams.  These Care Teams include your primary Cardiologist (physician) and Advanced Practice Providers (APPs -  Physician Assistants and Nurse Practitioners) who all work together to provide you with the care you need, when you need it.  Thank you for choosing [facility] at [place]!!   Yeiden Frenkel, LPN

## 2017-11-30 ENCOUNTER — Telehealth: Payer: Self-pay | Admitting: Acute Care

## 2017-11-30 DIAGNOSIS — Z87891 Personal history of nicotine dependence: Secondary | ICD-10-CM

## 2017-11-30 DIAGNOSIS — Z122 Encounter for screening for malignant neoplasm of respiratory organs: Secondary | ICD-10-CM

## 2017-11-30 NOTE — Telephone Encounter (Signed)
Called and spoke with Patient.  He stated that Langley Gauss had called for him.  Explained that she was gone for today, but we would send her a message, and she will call him later this week.  Will route to [place], Therapist, sports

## 2017-12-03 NOTE — Telephone Encounter (Signed)
Pt informed of CT results per Sarah Groce, NP.  PT verbalized understanding.  Copy sent to PCP.  Order placed for 1 yr f/u CT.  

## 2017-12-22 ENCOUNTER — Telehealth (HOSPITAL_COMMUNITY): Payer: Self-pay

## 2017-12-22 NOTE — Telephone Encounter (Signed)
Contacted pt and he states that he left the cardiac rehab office a message and will not be participating. He states he has started exercising regularly and has a new job where he is out of town often. I encouraged him to reconsider his decision and explained that cardiac rehab is an opportunity to receive medication management and discuss side effects from medications. I also encouraged him to notify his cardiologist of his decision. Of note, he states he is doing much better since switching from ticagrelor to clopidogrel.   Alec Johnson, PharmD PGY1 Pharmacy Resident Phone [phone] 12/22/2017 5:13 PM

## 2017-12-23 ENCOUNTER — Telehealth (HOSPITAL_COMMUNITY): Payer: Self-pay

## 2017-12-23 NOTE — Telephone Encounter (Signed)
Pt called and stated he has started working out at the gym and now has a part time job, not able to participate in CR.  Closed referral

## 2017-12-29 ENCOUNTER — Inpatient Hospital Stay (HOSPITAL_COMMUNITY): Admission: RE | Admit: 2017-12-29 | Payer: Medicare Other | Source: Ambulatory Visit

## 2018-01-04 ENCOUNTER — Ambulatory Visit (HOSPITAL_COMMUNITY): Payer: Medicare Other

## 2018-01-06 ENCOUNTER — Ambulatory Visit (HOSPITAL_COMMUNITY): Payer: Medicare Other

## 2018-01-08 ENCOUNTER — Ambulatory Visit (HOSPITAL_COMMUNITY): Payer: Medicare Other

## 2018-01-11 ENCOUNTER — Ambulatory Visit (HOSPITAL_COMMUNITY): Payer: Medicare Other

## 2018-01-15 ENCOUNTER — Ambulatory Visit (HOSPITAL_COMMUNITY): Payer: Medicare Other

## 2018-01-18 ENCOUNTER — Ambulatory Visit (HOSPITAL_COMMUNITY): Payer: Medicare Other

## 2018-01-18 NOTE — Progress Notes (Signed)
HPI: Follow-up coronary artery disease.  Admitted September 2019 with acute infarct.  Cardiac catheterization revealed an occluded RCA with left to right collaterals; 100% ostial ramus; normal LV function; PCI of ramus with drug-eluting stent.  Echocardiogram October 2019 showed vigorous LV function, grade 2 diastolic dysfunction and biatrial enlargement.  Since last seen the patient denies any dyspnea on exertion, orthopnea, PND, pedal edema, palpitations, syncope or chest pain.   Current Outpatient Medications  Medication Sig Dispense Refill  . acetaminophen (TYLENOL) 500 MG tablet Take 1,000 mg by mouth every 6 (six) hours as needed for mild pain or moderate pain.    Marland Kitchen aspirin EC 81 MG tablet Take 81 mg by mouth daily.    Marland Kitchen atorvastatin (LIPITOR) 80 MG tablet Take 1 tablet (80 mg total) by mouth daily at 6 PM. 90 tablet 1  . celecoxib (CELEBREX) 200 MG capsule Take 200 mg by mouth at bedtime.    . Cholecalciferol (VITAMIN D3) 5000 units TABS Take 5,000 Units by mouth 3 (three) times a week.     . clopidogrel (PLAVIX) 75 MG tablet Take 1 tablet (75 mg total) by mouth daily. 300Mg (4TAB) ON THE FIRST DAY THEN 75MG  DAILY THEREAFTER 34 tablet 3  . lisinopril (PRINIVIL,ZESTRIL) 10 MG tablet Take 1 tablet (10 mg total) by mouth daily. 90 tablet 0  . nitroGLYCERIN (NITROSTAT) 0.4 MG SL tablet Place 1 tablet (0.4 mg total) under the tongue every 5 (five) minutes x 3 doses as needed for chest pain. 25 tablet 1  . fluticasone (FLONASE) 50 MCG/ACT nasal spray Place 2 sprays into both nostrils daily. 48 g 0   No current facility-administered medications for this visit.      Past Medical History:  Diagnosis Date  . Colon polyp   . Diverticular disease   . Erectile dysfunction   . GERD (gastroesophageal reflux disease)    NO Recent problems  . Hyperlipidemia   . Hyperlipidemia   . Osteoarthritis   . Seborrheic keratosis   . STEMI (ST elevation myocardial infarction) ([place])    10/19/17  PCI/DES to ramus with CTO of the mRCA with collaterals, normal EF    Past Surgical History:  Procedure Laterality Date  . athroscopic knee surgery     BIL KNEES  . CORONARY/GRAFT ACUTE MI REVASCULARIZATION N/A 10/19/2017   Procedure: Coronary/Graft Acute MI Revascularization;  Surgeon: Jettie Booze, MD;  Location: [facility];  Service: Cardiovascular;  Laterality: N/A;  . LEFT HEART CATH AND CORONARY ANGIOGRAPHY N/A 10/19/2017   Procedure: LEFT HEART CATH AND CORONARY ANGIOGRAPHY;  Surgeon: Jettie Booze, MD;  Location: [facility];  Service: Cardiovascular;  Laterality: N/A;  . LEG SURGERY  1966   PINNING DUE TO INJURY  . NOSE SURGERY    . TOTAL KNEE ARTHROPLASTY Right 01/18/2014   Procedure: RIGHT TOTAL KNEE ARTHROPLASTY;  Surgeon: Tobi Bastos, MD;  Location: [facility];  Service: Orthopedics;  Laterality: Right;    Social History   Socioeconomic History  . Marital status: Married    Spouse name: [name]  . Number of children: 2  . Years of education: 22  . Highest education level: Not on file  Occupational History  . Occupation: RETIRED     Employer: [employer]  Social Needs  . Financial resource strain: Not on file  . Food insecurity:    Worry: Not on file    Inability: Not on file  . Transportation needs:  Medical: Not on file    Non-medical: Not on file  Tobacco Use  . Smoking status: Former Smoker    Packs/day: 2.00    Years: 45.00    Pack years: 90.00    Types: Cigarettes    Last attempt to quit: 01/11/2012    Years since quitting: 6.0  . Smokeless tobacco: Never Used  . Tobacco comment: He has quit smoking.    Substance and Sexual Activity  . Alcohol use: Yes    Comment: Occasional  . Drug use: No  . Sexual activity: Yes  Lifestyle  . Physical activity:    Days per week: Not on file    Minutes per session: Not on file  . Stress: Not on file  Relationships  . Social connections:    Talks on phone: Not on file     Gets together: Not on file    Attends religious service: Not on file    Active member of club or organization: Not on file    Attends meetings of clubs or organizations: Not on file    Relationship status: Not on file  . Intimate partner violence:    Fear of current or ex partner: Not on file    Emotionally abused: Not on file    Physically abused: Not on file    Forced sexual activity: Not on file  Other Topics Concern  . 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.     Education:  [employer]    Tobacco Use/Exposure: He has quit smoking.        Alcohol Use: Occasional    Drug Use:  None   Diet:  Regular   Exercise:  He was walking daily until he hurt his hip.     Hobbies:  Fishing, Gardening, Golf     Family History  Problem Relation Age of Onset  . Coronary artery disease Brother        MI  . Heart attack Brother   . Stroke Mother   . Cancer Father        Lung Cancer  . Cancer Paternal Grandfather        Lung Cancer  . Heart attack Paternal Grandfather     ROS: no fevers or chills, productive cough, hemoptysis, dysphasia, odynophagia, melena, hematochezia, dysuria, hematuria, rash, seizure activity, orthopnea, PND, pedal edema, claudication. Remaining systems are negative.  Physical Exam: Well-developed well-nourished in no acute distress.  Skin is warm and dry.  HEENT is normal.  Neck is supple.  Chest is clear to auscultation with normal expansion.  Cardiovascular exam is regular rate and rhythm.  Abdominal exam nontender or distended. No masses palpated. Positive bruit Extremities show no edema. neuro grossly intact  A/P  1 coronary artery disease-patient doing well with no chest pain.  Plan to continue medical therapy with aspirin, Plavix and statin.  Note  Brilinta discontinued previously because of dyspnea.  Patient is not on a beta-blocker because of history of bradycardia.  2 hypertension-patient's blood pressure is controlled.  Continue present medications and follow.  3 hyperlipidemia-continue statin.  Check lipids and liver.  4 bruit-schedule ultrasound to exclude aneurysm.  Kirk Ruths, MD

## 2018-01-22 ENCOUNTER — Ambulatory Visit (HOSPITAL_COMMUNITY): Payer: Medicare Other

## 2018-01-25 ENCOUNTER — Ambulatory Visit (HOSPITAL_COMMUNITY): Payer: Medicare Other

## 2018-01-27 ENCOUNTER — Ambulatory Visit (HOSPITAL_COMMUNITY): Payer: Medicare Other

## 2018-01-29 ENCOUNTER — Ambulatory Visit (HOSPITAL_COMMUNITY): Payer: Medicare Other

## 2018-02-01 ENCOUNTER — Ambulatory Visit (HOSPITAL_COMMUNITY): Payer: Medicare Other

## 2018-02-01 ENCOUNTER — Encounter (INDEPENDENT_AMBULATORY_CARE_PROVIDER_SITE_OTHER): Payer: Self-pay

## 2018-02-01 ENCOUNTER — Ambulatory Visit (INDEPENDENT_AMBULATORY_CARE_PROVIDER_SITE_OTHER): Payer: Medicare Other | Admitting: Cardiology

## 2018-02-01 ENCOUNTER — Encounter: Payer: Self-pay | Admitting: Cardiology

## 2018-02-01 VITALS — BP 108/60 | HR 50 | Ht 70.0 in | Wt 232.0 lb

## 2018-02-01 DIAGNOSIS — I2510 Atherosclerotic heart disease of native coronary artery without angina pectoris: Secondary | ICD-10-CM | POA: Diagnosis not present

## 2018-02-01 DIAGNOSIS — E785 Hyperlipidemia, unspecified: Secondary | ICD-10-CM

## 2018-02-01 DIAGNOSIS — Z87891 Personal history of nicotine dependence: Secondary | ICD-10-CM | POA: Diagnosis not present

## 2018-02-01 DIAGNOSIS — R0989 Other specified symptoms and signs involving the circulatory and respiratory systems: Secondary | ICD-10-CM

## 2018-02-01 LAB — HEPATIC FUNCTION PANEL
ALT: 31 IU/L (ref 0–44)
AST: 23 IU/L (ref 0–40)
Albumin: 4.2 g/dL (ref 3.6–4.8)
Alkaline Phosphatase: 90 IU/L (ref 39–117)
Bilirubin Total: 0.5 mg/dL (ref 0.0–1.2)
Bilirubin, Direct: 0.15 mg/dL (ref 0.00–0.40)
TOTAL PROTEIN: 6.1 g/dL (ref 6.0–8.5)

## 2018-02-01 LAB — LIPID PANEL
CHOL/HDL RATIO: 3.4 ratio (ref 0.0–5.0)
Cholesterol, Total: 132 mg/dL (ref 100–199)
HDL: 39 mg/dL — AB (ref >39)
LDL Calculated: 84 mg/dL (ref 0–99)
Triglycerides: 47 mg/dL (ref 0–149)
VLDL CHOLESTEROL CAL: 9 mg/dL (ref 5–40)

## 2018-02-01 NOTE — Patient Instructions (Signed)
Medication Instructions:  NO CHANGE If you need a refill on your cardiac medications before your next appointment, please call your pharmacy.   Lab work: Your physician recommends that you HAVE LAB WORK TODAY If you have labs (blood work) drawn today and your tests are completely normal, you will receive your results only by: Marland Kitchen MyChart Message (if you have MyChart) OR . A paper copy in the mail If you have any lab test that is abnormal or we need to change your treatment, we will call you to review the results.  Testing/Procedures: Your physician has requested that you have an abdominal aorta duplex. During this test, an ultrasound is used to evaluate the aorta. Allow 30 minutes for this exam. Do not eat after midnight the day before and avoid carbonated beverages SCHEDULE AT THE [place] OFFICE  Follow-Up: At [facility], you and your health needs are our priority.  As part of our continuing mission to provide you with exceptional heart care, we have created designated Provider Care Teams.  These Care Teams include your primary Cardiologist (physician) and Advanced Practice Providers (APPs -  Physician Assistants and Nurse Practitioners) who all work together to provide you with the care you need, when you need it.  Your physician recommends that you schedule a follow-up appointment in: [place]

## 2018-02-02 ENCOUNTER — Telehealth: Payer: Self-pay | Admitting: *Deleted

## 2018-02-02 DIAGNOSIS — E785 Hyperlipidemia, unspecified: Secondary | ICD-10-CM

## 2018-02-02 MED ORDER — EZETIMIBE 10 MG PO TABS: 10.0000 mg | ORAL_TABLET | Freq: Every day | ORAL | 3 refills | Status: DC

## 2018-02-02 NOTE — Telephone Encounter (Signed)
pt aware of results  New script sent to the pharmacy and Lab orders mailed to the pt

## 2018-02-02 NOTE — Telephone Encounter (Addendum)
Left message for pt to call   ----- Message from Lelon Perla, MD sent at 02/01/2018  4:52 PM EST ----- Add zetia 10 mg daily; lipids and liver 8 weeks Kirk Ruths

## 2018-02-03 ENCOUNTER — Ambulatory Visit (HOSPITAL_COMMUNITY): Payer: Medicare Other

## 2018-02-05 ENCOUNTER — Ambulatory Visit (HOSPITAL_COMMUNITY): Payer: Medicare Other

## 2018-02-08 ENCOUNTER — Ambulatory Visit (HOSPITAL_COMMUNITY): Payer: Medicare Other

## 2018-02-09 ENCOUNTER — Ambulatory Visit (HOSPITAL_BASED_OUTPATIENT_CLINIC_OR_DEPARTMENT_OTHER)
Admission: RE | Admit: 2018-02-09 | Discharge: 2018-02-09 | Disposition: A | Discharge status: Home / Self Care | Payer: Medicare Other | Source: Ambulatory Visit | Attending: Cardiology | Admitting: Cardiology

## 2018-02-09 DIAGNOSIS — Z87891 Personal history of nicotine dependence: Secondary | ICD-10-CM | POA: Diagnosis not present

## 2018-02-09 DIAGNOSIS — R0989 Other specified symptoms and signs involving the circulatory and respiratory systems: Secondary | ICD-10-CM | POA: Diagnosis not present

## 2018-02-09 NOTE — Progress Notes (Signed)
Abdominal Aorta Ultrasound Duplex   No evidence of an abdominal aortic aneurysm was visualized. The largest aortic measurement is 2.6 cm. No evidence of an abdominal aortic obstruction was visualized.   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     02/09/18 Cardell Peach RDCS,RVT

## 2018-02-10 ENCOUNTER — Ambulatory Visit (HOSPITAL_COMMUNITY): Payer: Medicare Other

## 2018-02-12 ENCOUNTER — Ambulatory Visit (HOSPITAL_COMMUNITY): Payer: Medicare Other

## 2018-02-15 ENCOUNTER — Ambulatory Visit (HOSPITAL_COMMUNITY): Payer: Medicare Other

## 2018-02-17 ENCOUNTER — Ambulatory Visit (HOSPITAL_COMMUNITY): Payer: Medicare Other

## 2018-02-19 ENCOUNTER — Ambulatory Visit (HOSPITAL_COMMUNITY): Payer: Medicare Other

## 2018-02-22 ENCOUNTER — Ambulatory Visit (HOSPITAL_COMMUNITY): Payer: Medicare Other

## 2018-02-24 ENCOUNTER — Ambulatory Visit (HOSPITAL_COMMUNITY): Payer: Medicare Other

## 2018-02-26 ENCOUNTER — Ambulatory Visit (HOSPITAL_COMMUNITY): Payer: Medicare Other

## 2018-03-01 ENCOUNTER — Ambulatory Visit (HOSPITAL_COMMUNITY): Payer: Medicare Other

## 2018-03-03 ENCOUNTER — Ambulatory Visit (HOSPITAL_COMMUNITY): Payer: Medicare Other

## 2018-03-05 ENCOUNTER — Ambulatory Visit (HOSPITAL_COMMUNITY): Payer: Medicare Other

## 2018-03-07 ENCOUNTER — Other Ambulatory Visit: Payer: Self-pay | Admitting: Cardiology

## 2018-03-08 ENCOUNTER — Ambulatory Visit (HOSPITAL_COMMUNITY): Payer: Medicare Other

## 2018-03-08 ENCOUNTER — Other Ambulatory Visit: Payer: Self-pay | Admitting: Cardiology

## 2018-03-10 ENCOUNTER — Ambulatory Visit (HOSPITAL_COMMUNITY): Payer: Medicare Other

## 2018-03-12 ENCOUNTER — Ambulatory Visit (HOSPITAL_COMMUNITY): Payer: Medicare Other

## 2018-03-15 ENCOUNTER — Ambulatory Visit (HOSPITAL_COMMUNITY): Payer: Medicare Other

## 2018-03-17 ENCOUNTER — Ambulatory Visit (HOSPITAL_COMMUNITY): Payer: Medicare Other

## 2018-03-19 ENCOUNTER — Ambulatory Visit (HOSPITAL_COMMUNITY): Payer: Medicare Other

## 2018-03-22 ENCOUNTER — Ambulatory Visit (HOSPITAL_COMMUNITY): Payer: Medicare Other

## 2018-03-24 ENCOUNTER — Ambulatory Visit (HOSPITAL_COMMUNITY): Payer: Medicare Other

## 2018-03-26 ENCOUNTER — Ambulatory Visit (HOSPITAL_COMMUNITY): Payer: Medicare Other

## 2018-03-29 ENCOUNTER — Ambulatory Visit (HOSPITAL_COMMUNITY): Payer: Medicare Other

## 2018-03-31 ENCOUNTER — Ambulatory Visit (HOSPITAL_COMMUNITY): Payer: Medicare Other

## 2018-04-01 LAB — HEPATIC FUNCTION PANEL
ALT: 39 IU/L (ref 0–44)
AST: 25 IU/L (ref 0–40)
Albumin: 4.5 g/dL (ref 3.8–4.8)
Alkaline Phosphatase: 85 IU/L (ref 39–117)
Bilirubin Total: 0.7 mg/dL (ref 0.0–1.2)
Bilirubin, Direct: 0.21 mg/dL (ref 0.00–0.40)
Total Protein: 6.4 g/dL (ref 6.0–8.5)

## 2018-04-01 LAB — LIPID PANEL
CHOL/HDL RATIO: 3 ratio (ref 0.0–5.0)
Cholesterol, Total: 126 mg/dL (ref 100–199)
HDL: 42 mg/dL (ref >39)
LDL CALC: 71 mg/dL (ref 0–99)
TRIGLYCERIDES: 66 mg/dL (ref 0–149)
VLDL Cholesterol Cal: 13 mg/dL (ref 5–40)

## 2018-04-02 ENCOUNTER — Ambulatory Visit (HOSPITAL_COMMUNITY): Payer: Medicare Other

## 2018-04-05 ENCOUNTER — Encounter: Payer: Self-pay | Admitting: *Deleted

## 2018-04-05 ENCOUNTER — Ambulatory Visit (HOSPITAL_COMMUNITY): Payer: Medicare Other

## 2018-04-07 ENCOUNTER — Ambulatory Visit (HOSPITAL_COMMUNITY): Payer: Medicare Other

## 2018-04-29 ENCOUNTER — Other Ambulatory Visit: Payer: Self-pay | Admitting: Cardiology

## 2018-04-29 MED ORDER — ATORVASTATIN CALCIUM 80 MG PO TABS: 80.0000 mg | ORAL_TABLET | Freq: Every day | ORAL | 1 refills | Status: DC

## 2018-04-29 NOTE — Telephone Encounter (Signed)
Pt calling requesting a refill on atorvastatin sent to [facility]. Please address

## 2018-09-10 ENCOUNTER — Other Ambulatory Visit: Payer: Self-pay | Admitting: Cardiology

## 2018-09-28 NOTE — Progress Notes (Signed)
HPI: Follow-up coronary artery disease.  Admitted September 2019 with acute infarct.  Cardiac catheterization revealed an occluded RCA with left to right collaterals; 100% ostial ramus; normal LV function; PCI of ramus with drug-eluting stent.  Echocardiogram October 2019 showed vigorous LV function, grade 2 diastolic dysfunction and biatrial enlargement.    Abdominal ultrasound January 2020 showed no aneurysm.  Since last seen patient has dyspnea with more vigorous activities but not routine activities.  No orthopnea, PND, pedal edema or syncope.  He has vague chest tightness in the left breast area unlike his previous infarct pain.  It is not exertional and he only feels it when he is "quiet".  It can last hours at a time.  He does not have exertional chest pain or symptoms similar to his infarct pain.  Current Outpatient Medications  Medication Sig Dispense Refill  . acetaminophen (TYLENOL) 500 MG tablet Take 1,000 mg by mouth every 6 (six) hours as needed for mild pain or moderate pain.    Marland Kitchen aspirin EC 81 MG tablet Take 81 mg by mouth daily.    Marland Kitchen atorvastatin (LIPITOR) 80 MG tablet Take 1 tablet (80 mg total) by mouth daily at 6 PM. 90 tablet 1  . celecoxib (CELEBREX) 200 MG capsule Take 200 mg by mouth at bedtime.    . Cholecalciferol (VITAMIN D3) 5000 units TABS Take 5,000 Units by mouth 3 (three) times a week.     . clopidogrel (PLAVIX) 75 MG tablet Take 1 tablet (75 mg total) by mouth daily. 90 tablet 1  . ezetimibe (ZETIA) 10 MG tablet Take 1 tablet (10 mg total) by mouth daily. 90 tablet 3  . fluticasone (FLONASE) 50 MCG/ACT nasal spray Place 2 sprays into both nostrils daily. 48 g 0  . Glucosamine HCl (GLUCOSAMINE PO) Take 500 mg by mouth daily.    Marland Kitchen lisinopril (ZESTRIL) 10 MG tablet TAKE 1 TABLET(10 MG) BY MOUTH DAILY 90 tablet 0  . nitroGLYCERIN (NITROSTAT) 0.4 MG SL tablet Place 1 tablet (0.4 mg total) under the tongue every 5 (five) minutes x 3 doses as needed for chest pain.  25 tablet 1   No current facility-administered medications for this visit.      Past Medical History:  Diagnosis Date  . Colon polyp   . Diverticular disease   . Erectile dysfunction   . GERD (gastroesophageal reflux disease)    NO Recent problems  . Hyperlipidemia   . Hyperlipidemia   . Osteoarthritis   . Seborrheic keratosis   . STEMI (ST elevation myocardial infarction) ([place])    10/19/17 PCI/DES to ramus with CTO of the mRCA with collaterals, normal EF    Past Surgical History:  Procedure Laterality Date  . athroscopic knee surgery     BIL KNEES  . CORONARY/GRAFT ACUTE MI REVASCULARIZATION N/A 10/19/2017   Procedure: Coronary/Graft Acute MI Revascularization;  Surgeon: Jettie Booze, MD;  Location: [facility];  Service: Cardiovascular;  Laterality: N/A;  . LEFT HEART CATH AND CORONARY ANGIOGRAPHY N/A 10/19/2017   Procedure: LEFT HEART CATH AND CORONARY ANGIOGRAPHY;  Surgeon: Jettie Booze, MD;  Location: [facility];  Service: Cardiovascular;  Laterality: N/A;  . LEG SURGERY  1966   PINNING DUE TO INJURY  . NOSE SURGERY    . TOTAL KNEE ARTHROPLASTY Right 01/18/2014   Procedure: RIGHT TOTAL KNEE ARTHROPLASTY;  Surgeon: Tobi Bastos, MD;  Location: [facility];  Service: Orthopedics;  Laterality: Right;  Social History   Socioeconomic History  . Marital status: Married    Spouse name: [name]  . Number of children: 2  . Years of education: 11  . Highest education level: Not on file  Occupational History  . Occupation: RETIRED     Employer: [employer]  Social Needs  . Financial resource strain: Not on file  . Food insecurity    Worry: Not on file    Inability: Not on file  . Transportation needs    Medical: Not on file    Non-medical: Not on file  Tobacco Use  . Smoking status: Former Smoker    Packs/day: 2.00    Years: 45.00    Pack years: 90.00    Types: Cigarettes    Quit date: 01/11/2012    Years since quitting: 6.7  .  Smokeless tobacco: Never Used  . Tobacco comment: He has quit smoking.    Substance and Sexual Activity  . Alcohol use: Yes    Comment: Occasional  . Drug use: No  . Sexual activity: Yes  Lifestyle  . Physical activity    Days per week: Not on file    Minutes per session: Not on file  . Stress: Not on file  Relationships  . Social Herbalist on phone: Not on file    Gets together: Not on file    Attends religious service: Not on file    Active member of club or organization: Not on file    Attends meetings of clubs or organizations: Not on file    Relationship status: Not on file  . Intimate partner violence    Fear of current or ex partner: Not on file    Emotionally abused: Not on file    Physically abused: Not on file    Forced sexual activity: Not on file  Other Topics Concern  . 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.     Education:  [employer]    Tobacco Use/Exposure: He has quit smoking.        Alcohol Use: Occasional    Drug Use:  None   Diet:  Regular   Exercise:  He was walking daily until he hurt his hip.     Hobbies:  Fishing, Gardening, Golf     Family History  Problem Relation Age of Onset  . Coronary artery disease Brother        MI  . Heart attack Brother   . Stroke Mother   . Cancer Father        Lung Cancer  . Cancer Paternal Grandfather        Lung Cancer  . Heart attack Paternal Grandfather     ROS: no fevers or chills, productive cough, hemoptysis, dysphasia, odynophagia, melena, hematochezia, dysuria, hematuria, rash, seizure activity, orthopnea, PND, pedal edema, claudication. Remaining systems are negative.  Physical Exam: Well-developed well-nourished in no acute distress.  Skin is warm and dry.  HEENT is normal.   Neck is supple.  Chest is clear to auscultation with normal expansion.  Cardiovascular exam is regular and bradycardic.  Abdominal exam nontender or distended. No masses palpated. Extremities show no edema. neuro grossly intact  ECG-sinus bradycardia at a rate of 42, left ventricular hypertrophy.  Personally reviewed  A/P  1 coronary artery disease-patient has had no recurrent chest pain similar to his infarct pain.  He has atypical vague chest tightness that does not sound cardiac.  Electrocardiogram shows no ST changes.  Continue aspirin and statin.  We will discontinue Plavix as it has been 1 year since last PCI.  2 hypertension-blood pressure controlled.  Continue present medications and follow.  3 hyperlipidemia-continue statin and zetia.  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  He has not had syncope or other symptoms.  Avoid AV nodal blocking agents.  Kirk Ruths, MD

## 2018-09-29 ENCOUNTER — Other Ambulatory Visit: Payer: Self-pay

## 2018-09-29 ENCOUNTER — Encounter: Payer: Self-pay | Admitting: Cardiology

## 2018-09-29 ENCOUNTER — Ambulatory Visit (INDEPENDENT_AMBULATORY_CARE_PROVIDER_SITE_OTHER): Payer: Medicare Other | Admitting: Cardiology

## 2018-09-29 VITALS — BP 124/60 | HR 43 | Ht 70.0 in | Wt 232.0 lb

## 2018-09-29 DIAGNOSIS — E785 Hyperlipidemia, unspecified: Secondary | ICD-10-CM | POA: Diagnosis not present

## 2018-09-29 DIAGNOSIS — I2510 Atherosclerotic heart disease of native coronary artery without angina pectoris: Secondary | ICD-10-CM

## 2018-09-29 DIAGNOSIS — Z9861 Coronary angioplasty status: Secondary | ICD-10-CM

## 2018-09-29 DIAGNOSIS — I10 Essential (primary) hypertension: Secondary | ICD-10-CM | POA: Diagnosis not present

## 2018-09-29 NOTE — Patient Instructions (Signed)
Your physician has recommended you make the following change in your medication: STOP PLAVIX  Your physician wants you to follow-up in: [place] will receive a reminder letter in the mail two months in advance. If you don't receive a letter, please call our office to schedule the follow-up appointment.

## 2018-10-28 ENCOUNTER — Other Ambulatory Visit: Payer: Self-pay | Admitting: Cardiology

## 2018-10-28 MED ORDER — ATORVASTATIN CALCIUM 80 MG PO TABS: 80.0000 mg | ORAL_TABLET | Freq: Every day | ORAL | 1 refills | Status: DC

## 2018-10-28 NOTE — Telephone Encounter (Signed)
New Message   *STAT* If patient is at the pharmacy, call can be transferred to refill team.   1. Which medications need to be refilled? (please list name of each medication and dose if known) atorvastatin (LIPITOR) 80 MG tablet  2. Which pharmacy/location (including street and city if local pharmacy) is medication to be sent to? [id number] - [place], [place] - [address]  3. Do they need a 30 day or 90 day supply? 90 day

## 2018-10-28 NOTE — Telephone Encounter (Signed)
Rx request sent to pharmacy.  

## 2018-11-29 ENCOUNTER — Other Ambulatory Visit: Payer: Self-pay

## 2018-11-29 ENCOUNTER — Ambulatory Visit (HOSPITAL_BASED_OUTPATIENT_CLINIC_OR_DEPARTMENT_OTHER)
Admission: RE | Admit: 2018-11-29 | Discharge: 2018-11-29 | Disposition: A | Discharge status: Home / Self Care | Payer: Medicare Other | Source: Ambulatory Visit | Attending: Acute Care | Admitting: Acute Care

## 2018-11-29 DIAGNOSIS — Z87891 Personal history of nicotine dependence: Secondary | ICD-10-CM | POA: Insufficient documentation

## 2018-11-29 DIAGNOSIS — Z122 Encounter for screening for malignant neoplasm of respiratory organs: Secondary | ICD-10-CM

## 2018-11-29 IMAGING — CT CT CHEST LUNG CANCER SCREENING LOW DOSE W/O CM
2 of 4 series · 15 of 40 positions shown, 18 images · non-contrast
Comparison: [DATE]

CLINICAL DATA: 68-year-old former smoker with 92 pack-year history.
Lung cancer screening.

EXAM:
CT CHEST WITHOUT CONTRAST LOW-DOSE FOR LUNG CANCER SCREENING
TECHNIQUE: Multidetector CT imaging of the chest was performed following the
standard protocol without IV contrast.

[Series 2: axial st · axial · 0.83mm/px · z∈[-292,-12]mm · 12 of 62 slices shown, 15 images]
[im 3/62  mediastinal]
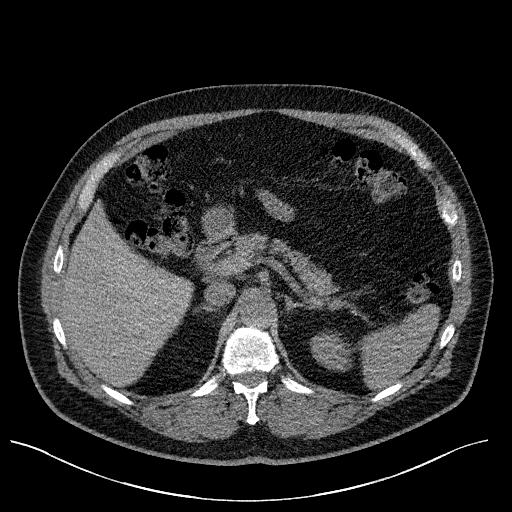
[im 3/62  lung]
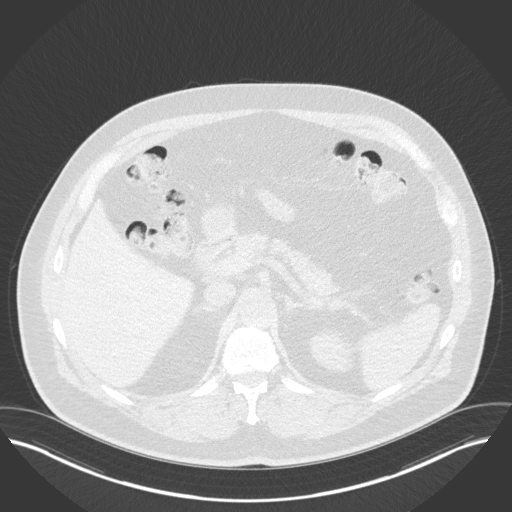
[im 8/62  lung]
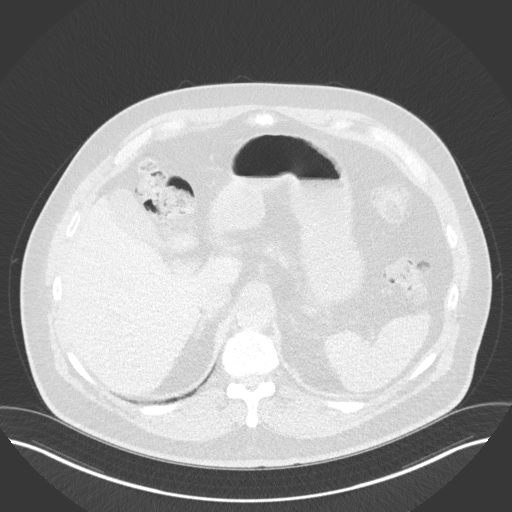
[im 14/62  lung]
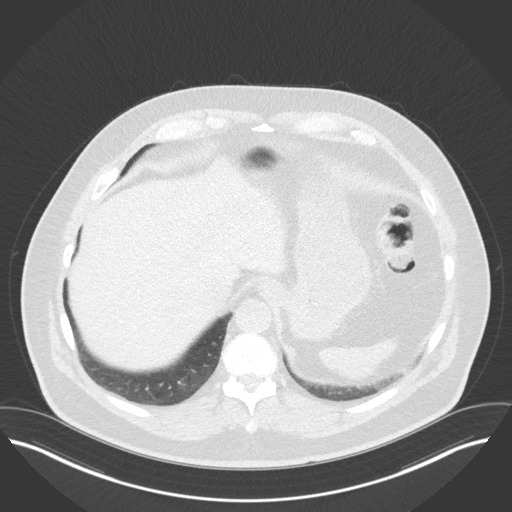
[im 19/62  lung]
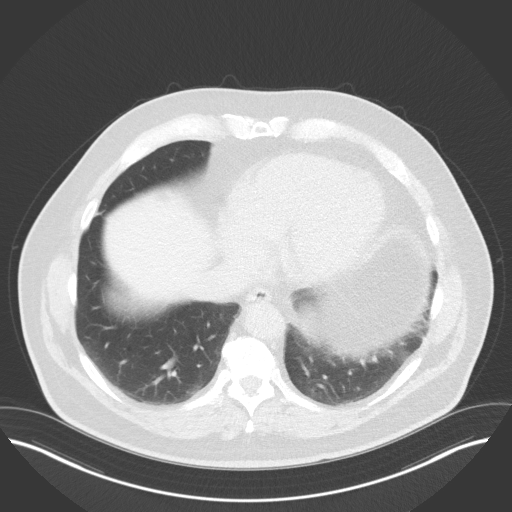
[im 24/62  mediastinal]
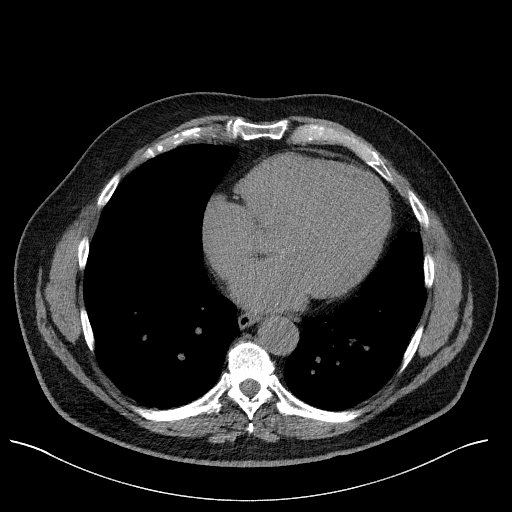
[im 24/62  lung]
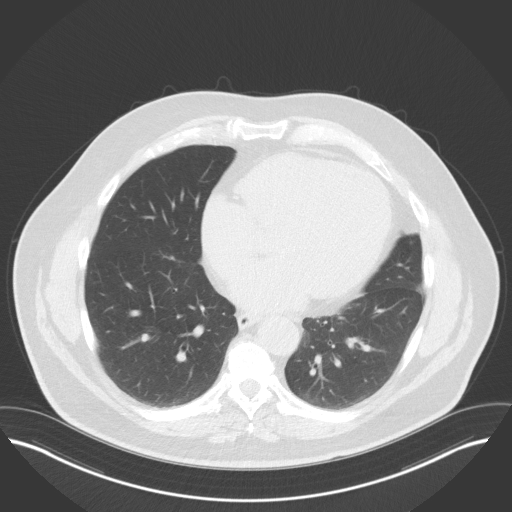
[im 30/62  lung]
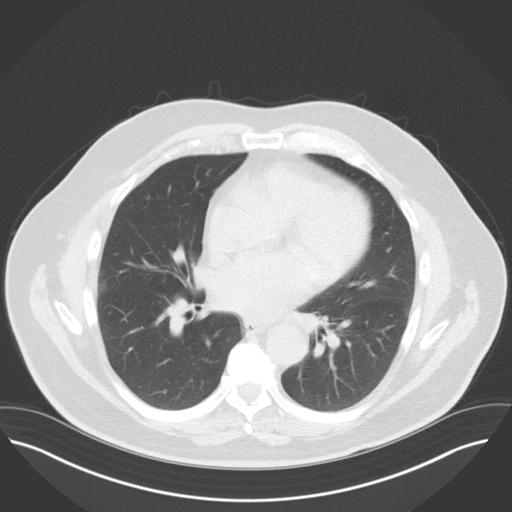
[im 32/62  lung]
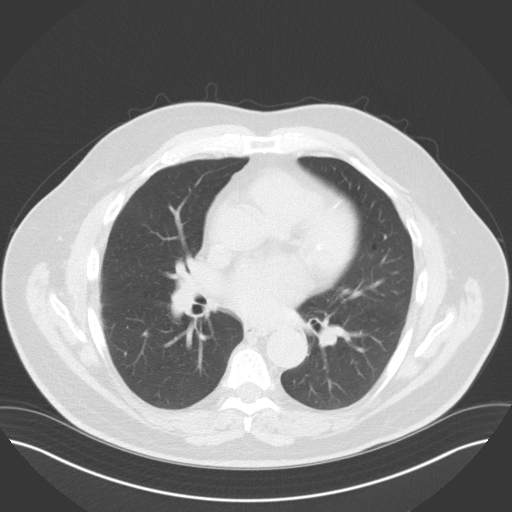
[im 38/62  lung]
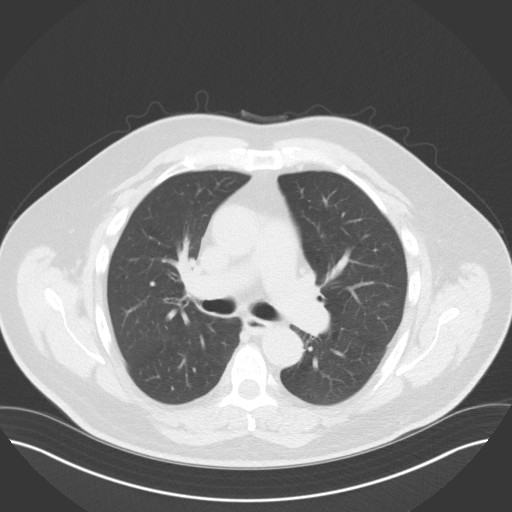
[im 43/62  mediastinal]
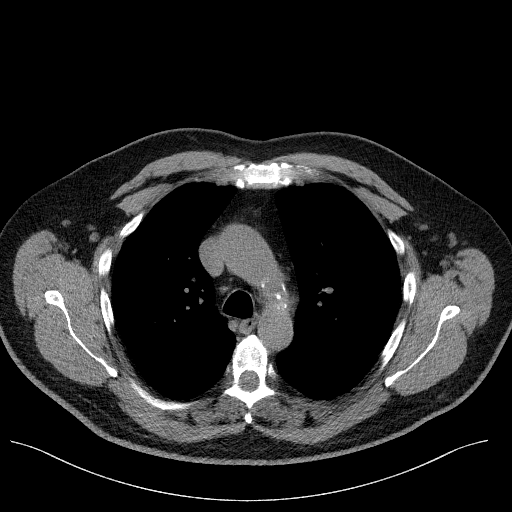
[im 43/62  lung]
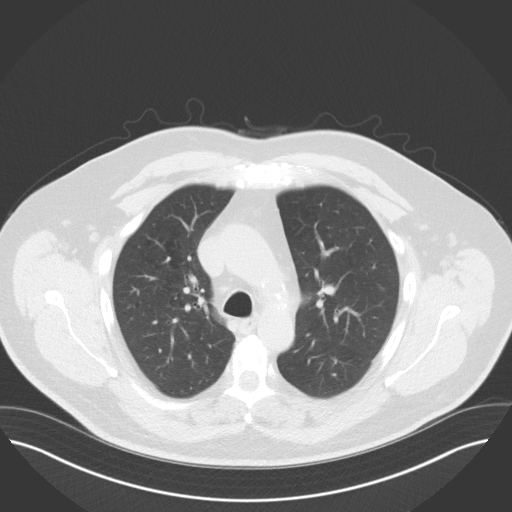
[im 48/62  lung]
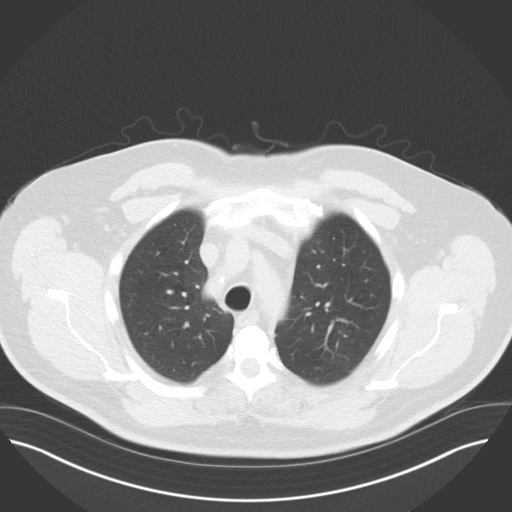
[im 54/62  lung]
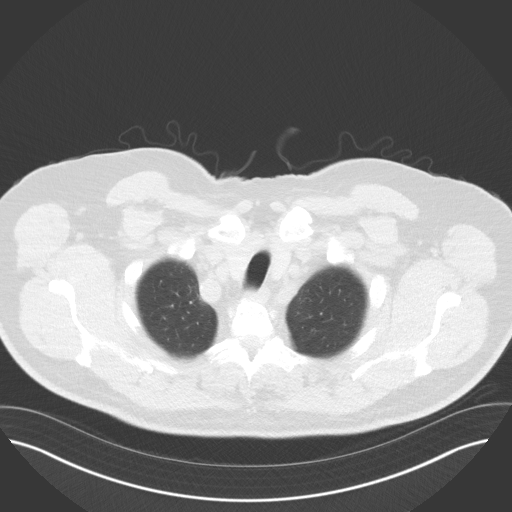
[im 59/62  lung]
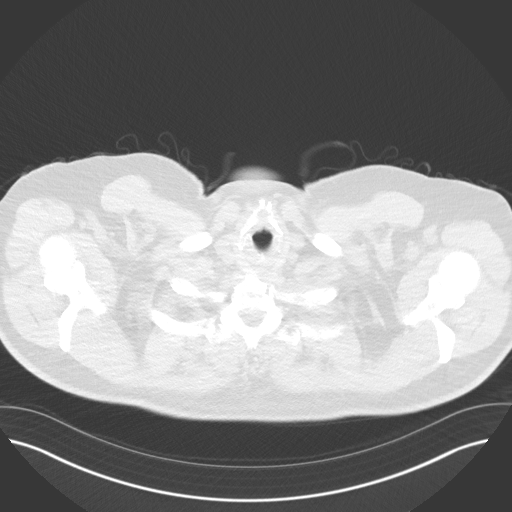

[Series 4: coronal · coronal · 0.62mm/px · 3 of 314 slices shown]
[im 63/314  lung]
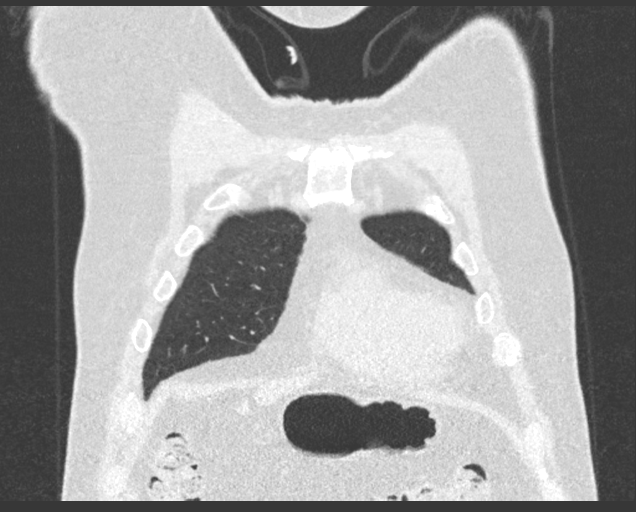
[im 126/314  lung]
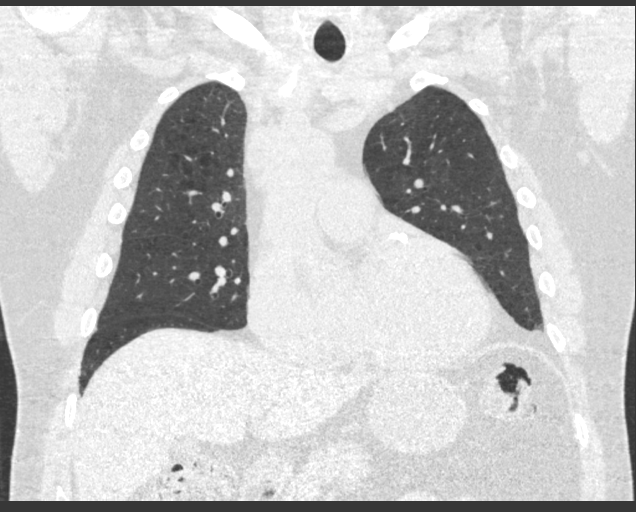
[im 188/314  lung]
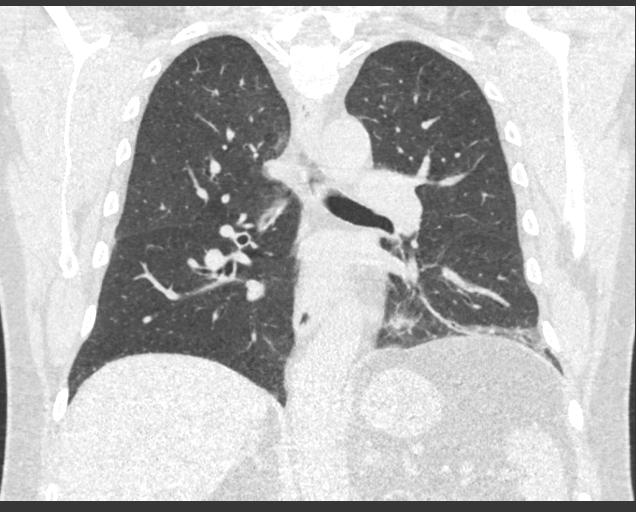

[15 of 40 positions shown; findings below may reference images not displayed]

FINDINGS: Cardiovascular: Heart size upper normal. Coronary artery
calcification is evident. Atherosclerotic calcification is noted in
the wall of the thoracic aorta. Main pulmonary artery is mildly
prominent.

Mediastinum/Nodes: No mediastinal lymphadenopathy. No evidence for
gross hilar lymphadenopathy although assessment is limited by the
lack of intravenous contrast on today's study. The esophagus has
normal imaging features. There is no axillary lymphadenopathy.

Lungs/Pleura: Centrilobular and paraseptal emphysema evident. Stable
tiny pulmonary nodules. No new suspicious nodule or mass. No focal
airspace consolidation. No pleural effusion.

Upper Abdomen: Unremarkable.

Musculoskeletal: No worrisome lytic or sclerotic osseous
abnormality.
IMPRESSION: 1. Lung-RADS 2, benign appearance or behavior. Continue annual
screening with low-dose chest CT without contrast in 12 months.
2. Mild prominence main pulmonary artery. Pulmonary arterial
hypertension not excluded.
3.  Emphysema. ([QS]-[QS])
4.  Aortic Atherosclerois ([QS]-170.0)

## 2018-12-09 ENCOUNTER — Other Ambulatory Visit: Payer: Self-pay | Admitting: Cardiology

## 2018-12-10 ENCOUNTER — Telehealth: Payer: Self-pay

## 2018-12-10 DIAGNOSIS — Z122 Encounter for screening for malignant neoplasm of respiratory organs: Secondary | ICD-10-CM

## 2018-12-10 DIAGNOSIS — Z87891 Personal history of nicotine dependence: Secondary | ICD-10-CM

## 2018-12-13 NOTE — Telephone Encounter (Signed)
Pt returning call and would like a call back.

## 2018-12-13 NOTE — Telephone Encounter (Signed)
LMTC x 1  

## 2018-12-13 NOTE — Telephone Encounter (Signed)
Pt informed of CT results per Sarah Groce, NP.  PT verbalized understanding.  Copy sent to PCP.  Order placed for 1 yr f/u CT.  

## 2019-01-17 ENCOUNTER — Other Ambulatory Visit: Payer: Self-pay

## 2019-01-17 DIAGNOSIS — E785 Hyperlipidemia, unspecified: Secondary | ICD-10-CM

## 2019-01-17 MED ORDER — EZETIMIBE 10 MG PO TABS: 10.0000 mg | ORAL_TABLET | Freq: Every day | ORAL | 3 refills | Status: DC

## 2019-03-02 ENCOUNTER — Other Ambulatory Visit: Payer: Self-pay | Admitting: Cardiology

## 2019-03-02 MED ORDER — LISINOPRIL 10 MG PO TABS: ORAL_TABLET | ORAL | 1 refills | Status: DC

## 2019-03-02 NOTE — Telephone Encounter (Signed)
*  STAT* If patient is at the pharmacy, call can be transferred to refill team.   1. Which medications need to be refilled? (please list name of each medication and dose if known) lisinopril (ZESTRIL) 10 MG tablet  2. Which pharmacy/location (including street and city if local pharmacy) is medication being sent to? [facility]  3. Do they need a 30 day or 90 day supply? 90 day supply

## 2019-03-22 NOTE — Progress Notes (Signed)
HPI: Follow-up CAD. Admitted September 2019 with acute infarct. Cardiac catheterization revealed an occluded RCA with left to right collaterals; 100% ostial ramus;normal LV function; PCI of ramus with drug-eluting stent. Echocardiogram October 2019 showed vigorous LV function, grade 2 diastolic dysfunction and biatrial enlargement. Abdominal ultrasound January 2020 showed no aneurysm. Since last seen  patient has had occasional episodes of dyspnea but denies dyspnea on exertion, orthopnea, PND, pedal edema, palpitations, syncope or chest pain.  Current Outpatient Medications  Medication Sig Dispense Refill  . acetaminophen (TYLENOL) 500 MG tablet Take 1,000 mg by mouth every 6 (six) hours as needed for mild pain or moderate pain.    Marland Kitchen aspirin EC 81 MG tablet Take 81 mg by mouth daily.    Marland Kitchen atorvastatin (LIPITOR) 80 MG tablet Take 1 tablet (80 mg total) by mouth daily at 6 PM. 90 tablet 1  . Cholecalciferol (VITAMIN D3) 5000 units TABS Take 5,000 Units by mouth 3 (three) times a week.     . ezetimibe (ZETIA) 10 MG tablet Take 1 tablet (10 mg total) by mouth daily. 90 tablet 3  . fluticasone (FLONASE) 50 MCG/ACT nasal spray Place 2 sprays into both nostrils daily. 48 g 0  . Glucosamine HCl (GLUCOSAMINE PO) Take 500 mg by mouth daily.    Marland Kitchen lisinopril (ZESTRIL) 10 MG tablet TAKE 1 TABLET(10 MG) BY MOUTH DAILY 90 tablet 1  . meloxicam (MOBIC) 15 MG tablet Take 15 mg by mouth daily.    . nitroGLYCERIN (NITROSTAT) 0.4 MG SL tablet Place 1 tablet (0.4 mg total) under the tongue every 5 (five) minutes x 3 doses as needed for chest pain. 25 tablet 1   No current facility-administered medications for this visit.     Past Medical History:  Diagnosis Date  . Colon polyp   . Diverticular disease   . Erectile dysfunction   . GERD (gastroesophageal reflux disease)    NO Recent problems  . Hyperlipidemia   . Hyperlipidemia   . Osteoarthritis   . Seborrheic keratosis   . STEMI (ST elevation  myocardial infarction) ([place])    10/19/17 PCI/DES to ramus with CTO of the mRCA with collaterals, normal EF    Past Surgical History:  Procedure Laterality Date  . athroscopic knee surgery     BIL KNEES  . CORONARY/GRAFT ACUTE MI REVASCULARIZATION N/A 10/19/2017   Procedure: Coronary/Graft Acute MI Revascularization;  Surgeon: Jettie Booze, MD;  Location: [facility];  Service: Cardiovascular;  Laterality: N/A;  . LEFT HEART CATH AND CORONARY ANGIOGRAPHY N/A 10/19/2017   Procedure: LEFT HEART CATH AND CORONARY ANGIOGRAPHY;  Surgeon: Jettie Booze, MD;  Location: [facility];  Service: Cardiovascular;  Laterality: N/A;  . LEG SURGERY  1966   PINNING DUE TO INJURY  . NOSE SURGERY    . TOTAL KNEE ARTHROPLASTY Right 01/18/2014   Procedure: RIGHT TOTAL KNEE ARTHROPLASTY;  Surgeon: Tobi Bastos, MD;  Location: [facility];  Service: Orthopedics;  Laterality: Right;    Social History   Socioeconomic History  . Marital status: Married    Spouse name: [name]  . Number of children: 2  . Years of education: 59  . Highest education level: Not on file  Occupational History  . Occupation: RETIRED     Employer: [employer]  Tobacco Use  . Smoking status: Former Smoker    Packs/day: 2.00    Years: 45.00    Pack years: 90.00    Types: Cigarettes  Quit date: 01/11/2012    Years since quitting: 7.2  . Smokeless tobacco: Never Used  . Tobacco comment: He has quit smoking.    Substance and Sexual Activity  . Alcohol use: Yes    Comment: Occasional  . Drug use: No  . Sexual activity: Yes  Other Topics Concern  . 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.     Education:  [employer]    Tobacco Use/Exposure: He has quit smoking.        Alcohol Use:  Occasional    Drug Use:  None   Diet:  Regular   Exercise:  He was walking daily until he hurt his hip.     Hobbies:  Fishing, Environmental education officer, Golf    Social Determinants of Health   Financial Resource Strain:   . Difficulty of Paying Living Expenses: Not on file  Food Insecurity:   . Worried About Charity fundraiser in the Last Year: Not on file  . Ran Out of Food in the Last Year: Not on file  Transportation Needs:   . Lack of Transportation (Medical): Not on file  . Lack of Transportation (Non-Medical): Not on file  Physical Activity:   . Days of Exercise per Week: Not on file  . Minutes of Exercise per Session: Not on file  Stress:   . Feeling of Stress : Not on file  Social Connections:   . Frequency of Communication with Friends and Family: Not on file  . Frequency of Social Gatherings with Friends and Family: Not on file  . Attends Religious Services: Not on file  . Active Member of Clubs or Organizations: Not on file  . Attends Archivist Meetings: Not on file  . Marital Status: Not on file  Intimate Partner Violence:   . Fear of Current or Ex-Partner: Not on file  . Emotionally Abused: Not on file  . Physically Abused: Not on file  . Sexually Abused: Not on file    Family History  Problem Relation Age of Onset  . Coronary artery disease Brother        MI  . Heart attack Brother   . Stroke Mother   . Cancer Father        Lung Cancer  . Cancer Paternal Grandfather        Lung Cancer  . Heart attack Paternal Grandfather     ROS: no fevers or chills, productive cough, hemoptysis, dysphasia, odynophagia, melena, hematochezia, dysuria, hematuria, rash, seizure activity, orthopnea, PND, pedal edema, claudication. Remaining systems are negative.  Physical Exam: Well-developed well-nourished in no acute distress.  Skin is warm and dry.  HEENT is normal.  Neck is supple.  Chest is clear to auscultation with normal expansion.  Cardiovascular exam is regular  rate and rhythm.  Abdominal exam nontender or distended. No masses palpated. Extremities show no edema. neuro grossly intact  A/P  1 coronary artery disease-patient is in has not had recurrent chest pain. Plan to continue medical therapy with aspirin and statin.  2 hypertension-blood pressure controlled. Continue present medical regimen.  Check potassium and renal function.  3 hyperlipidemia-continue present medications including Zetia and statin.  Check lipids and liver.  4 history of bradycardia-this has been longstanding. He has not had syncope. Avoid AV nodal  blocking agents.  Kirk Ruths, MD

## 2019-03-30 ENCOUNTER — Encounter: Payer: Self-pay | Admitting: Cardiology

## 2019-03-30 ENCOUNTER — Other Ambulatory Visit: Payer: Self-pay

## 2019-03-30 ENCOUNTER — Ambulatory Visit: Payer: Medicare Other | Admitting: Cardiology

## 2019-03-30 VITALS — BP 120/66 | HR 56 | Ht 70.0 in | Wt 240.1 lb

## 2019-03-30 DIAGNOSIS — I2510 Atherosclerotic heart disease of native coronary artery without angina pectoris: Secondary | ICD-10-CM | POA: Diagnosis not present

## 2019-03-30 DIAGNOSIS — R001 Bradycardia, unspecified: Secondary | ICD-10-CM

## 2019-03-30 DIAGNOSIS — E785 Hyperlipidemia, unspecified: Secondary | ICD-10-CM | POA: Diagnosis not present

## 2019-03-30 DIAGNOSIS — I10 Essential (primary) hypertension: Secondary | ICD-10-CM | POA: Diagnosis not present

## 2019-03-30 DIAGNOSIS — Z9861 Coronary angioplasty status: Secondary | ICD-10-CM

## 2019-03-30 MED ORDER — LISINOPRIL 10 MG PO TABS: ORAL_TABLET | ORAL | 1 refills | Status: DC

## 2019-03-30 MED ORDER — ATORVASTATIN CALCIUM 80 MG PO TABS: 80.0000 mg | ORAL_TABLET | Freq: Every day | ORAL | 1 refills | Status: DC

## 2019-03-30 MED ORDER — EZETIMIBE 10 MG PO TABS: 10.0000 mg | ORAL_TABLET | Freq: Every day | ORAL | 3 refills | Status: DC

## 2019-03-30 NOTE — Patient Instructions (Signed)
Medication Instructions:  NO CHANGE *If you need a refill on your cardiac medications before your next appointment, please call your pharmacy*   Lab Work: Your physician recommends that you HAVE LAB WORK TODAY If you have labs (blood work) drawn today and your tests are completely normal, you will receive your results only by: Marland Kitchen MyChart Message (if you have MyChart) OR . A paper copy in the mail If you have any lab test that is abnormal or we need to change your treatment, we will call you to review the results.   Follow-Up: At [facility], you and your health needs are our priority.  As part of our continuing mission to provide you with exceptional heart care, we have created designated Provider Care Teams.  These Care Teams include your primary Cardiologist (physician) and Advanced Practice Providers (APPs -  Physician Assistants and Nurse Practitioners) who all work together to provide you with the care you need, when you need it.  We recommend signing up for the patient portal called "MyChart".  Sign up information is provided on this After Visit Summary.  MyChart is used to connect with patients for Virtual Visits (Telemedicine).  Patients are able to view lab/test results, encounter notes, upcoming appointments, etc.  Non-urgent messages can be sent to your provider as well.   To learn more about what you can do with MyChart, go to [email or web address].    Your next appointment:   12 month(s)  The format for your next appointment:   Either In Person or Virtual  Provider:   Kirk Ruths, MD

## 2019-03-31 ENCOUNTER — Encounter: Payer: Self-pay | Admitting: *Deleted

## 2019-03-31 LAB — LIPID PANEL
Chol/HDL Ratio: 2.5 ratio (ref 0.0–5.0)
Cholesterol, Total: 100 mg/dL (ref 100–199)
HDL: 40 mg/dL (ref >39)
LDL Chol Calc (NIH): 50 mg/dL (ref 0–99)
Triglycerides: 36 mg/dL (ref 0–149)
VLDL Cholesterol Cal: 10 mg/dL (ref 5–40)

## 2019-03-31 LAB — COMPREHENSIVE METABOLIC PANEL
ALT: 24 IU/L (ref 0–44)
AST: 20 IU/L (ref 0–40)
Albumin/Globulin Ratio: 2.3 — ABNORMAL HIGH (ref 1.2–2.2)
Albumin: 4.3 g/dL (ref 3.8–4.8)
Alkaline Phosphatase: 91 IU/L (ref 39–117)
BUN/Creatinine Ratio: 14 (ref 10–24)
BUN: 13 mg/dL (ref 8–27)
Bilirubin Total: 0.6 mg/dL (ref 0.0–1.2)
CO2: 23 mmol/L (ref 20–29)
Calcium: 8.8 mg/dL (ref 8.6–10.2)
Chloride: 106 mmol/L (ref 96–106)
Creatinine, Ser: 0.95 mg/dL (ref 0.76–1.27)
GFR calc Af Amer: 93 mL/min/{1.73_m2} (ref >59)
GFR calc non Af Amer: 81 mL/min/{1.73_m2} (ref >59)
Globulin, Total: 1.9 g/dL (ref 1.5–4.5)
Glucose: 116 mg/dL — ABNORMAL HIGH (ref 65–99)
Potassium: 4.5 mmol/L (ref 3.5–5.2)
Sodium: 142 mmol/L (ref 134–144)
Total Protein: 6.2 g/dL (ref 6.0–8.5)

## 2019-08-20 ENCOUNTER — Other Ambulatory Visit: Payer: Self-pay | Admitting: Cardiology

## 2019-09-18 ENCOUNTER — Other Ambulatory Visit: Payer: Self-pay | Admitting: Cardiology

## 2019-10-10 NOTE — Progress Notes (Signed)
HPI: Follow-up CAD. Admitted September 2019 with acute infarct. Cardiac catheterization revealed an occluded RCA with left to right collaterals; 100% ostial ramus;normal LV function; PCI of ramus with drug-eluting stent. Echocardiogram October 2019 showed vigorous LV function, grade 2 diastolic dysfunction and biatrial enlargement.Abdominal ultrasound January 2020 showed no aneurysm. Since last seenpatient denies dyspnea, chest pain, palpitations or syncope.  Current Outpatient Medications  Medication Sig Dispense Refill  . acetaminophen (TYLENOL) 500 MG tablet Take 1,000 mg by mouth every 6 (six) hours as needed for mild pain or moderate pain.    Marland Kitchen aspirin EC 81 MG tablet Take 81 mg by mouth daily.    Marland Kitchen atorvastatin (LIPITOR) 80 MG tablet TAKE 1 TABLET BY MOUTH  DAILY AT 6 PM 90 tablet 2  . Cholecalciferol (VITAMIN D3) 5000 units TABS Take 5,000 Units by mouth 3 (three) times a week.     . ezetimibe (ZETIA) 10 MG tablet Take 1 tablet (10 mg total) by mouth daily. 90 tablet 3  . fluticasone (FLONASE) 50 MCG/ACT nasal spray Place 2 sprays into both nostrils daily. 48 g 0  . Glucosamine HCl (GLUCOSAMINE PO) Take 500 mg by mouth daily.    Marland Kitchen lisinopril (ZESTRIL) 10 MG tablet TAKE 1 TABLET BY MOUTH  DAILY 30 tablet 0  . meloxicam (MOBIC) 15 MG tablet Take 15 mg by mouth daily.    . nitroGLYCERIN (NITROSTAT) 0.4 MG SL tablet Place 1 tablet (0.4 mg total) under the tongue every 5 (five) minutes x 3 doses as needed for chest pain. 25 tablet 1   No current facility-administered medications for this visit.     Past Medical History:  Diagnosis Date  . Colon polyp   . Diverticular disease   . Erectile dysfunction   . GERD (gastroesophageal reflux disease)    NO Recent problems  . Hyperlipidemia   . Hyperlipidemia   . Osteoarthritis   . Seborrheic keratosis   . STEMI (ST elevation myocardial infarction) ([place])    10/19/17 PCI/DES to ramus with CTO of the mRCA with collaterals, normal EF      Past Surgical History:  Procedure Laterality Date  . athroscopic knee surgery     BIL KNEES  . CORONARY/GRAFT ACUTE MI REVASCULARIZATION N/A 10/19/2017   Procedure: Coronary/Graft Acute MI Revascularization;  Surgeon: Jettie Booze, MD;  Location: [facility];  Service: Cardiovascular;  Laterality: N/A;  . LEFT HEART CATH AND CORONARY ANGIOGRAPHY N/A 10/19/2017   Procedure: LEFT HEART CATH AND CORONARY ANGIOGRAPHY;  Surgeon: Jettie Booze, MD;  Location: [facility];  Service: Cardiovascular;  Laterality: N/A;  . LEG SURGERY  1966   PINNING DUE TO INJURY  . NOSE SURGERY    . TOTAL KNEE ARTHROPLASTY Right 01/18/2014   Procedure: RIGHT TOTAL KNEE ARTHROPLASTY;  Surgeon: Tobi Bastos, MD;  Location: [facility];  Service: Orthopedics;  Laterality: Right;    Social History   Socioeconomic History  . Marital status: Married    Spouse name: [name]  . Number of children: 2  . Years of education: 20  . Highest education level: Not on file  Occupational History  . Occupation: RETIRED     Employer: [employer]  Tobacco Use  . Smoking status: Former Smoker    Packs/day: 2.00    Years: 45.00    Pack years: 90.00    Types: Cigarettes    Quit date: 01/11/2012    Years since quitting: 7.7  . Smokeless tobacco:  Never Used  . Tobacco comment: He has quit smoking.    Substance and Sexual Activity  . Alcohol use: Yes    Comment: Occasional  . Drug use: No  . Sexual activity: Yes  Other Topics Concern  . 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.     Education:  [employer]    Tobacco Use/Exposure: He has quit smoking.        Alcohol Use: Occasional    Drug Use:  None   Diet:  Regular   Exercise:  He was walking daily until he hurt his hip.      Hobbies:  Fishing, Environmental education officer, Golf    Social Determinants of Health   Financial Resource Strain:   . Difficulty of Paying Living Expenses: Not on file  Food Insecurity:   . Worried About Charity fundraiser in the Last Year: Not on file  . Ran Out of Food in the Last Year: Not on file  Transportation Needs:   . Lack of Transportation (Medical): Not on file  . Lack of Transportation (Non-Medical): Not on file  Physical Activity:   . Days of Exercise per Week: Not on file  . Minutes of Exercise per Session: Not on file  Stress:   . Feeling of Stress : Not on file  Social Connections:   . Frequency of Communication with Friends and Family: Not on file  . Frequency of Social Gatherings with Friends and Family: Not on file  . Attends Religious Services: Not on file  . Active Member of Clubs or Organizations: Not on file  . Attends Archivist Meetings: Not on file  . Marital Status: Not on file  Intimate Partner Violence:   . Fear of Current or Ex-Partner: Not on file  . Emotionally Abused: Not on file  . Physically Abused: Not on file  . Sexually Abused: Not on file    Family History  Problem Relation Age of Onset  . Coronary artery disease Brother        MI  . Heart attack Brother   . Stroke Mother   . Cancer Father        Lung Cancer  . Cancer Paternal Grandfather        Lung Cancer  . Heart attack Paternal Grandfather     ROS: no fevers or chills, productive cough, hemoptysis, dysphasia, odynophagia, melena, hematochezia, dysuria, hematuria, rash, seizure activity, orthopnea, PND, pedal edema, claudication. Remaining systems are negative.  Physical Exam: Well-developed well-nourished in no acute distress.  Skin is warm and dry.  HEENT is normal.  Neck is supple.  Chest is clear to auscultation with normal expansion.  Cardiovascular exam is regular rate and rhythm.  Abdominal exam nontender or distended. No masses palpated. Extremities show no  edema. neuro grossly intact  ECG-sinus bradycardia with occasional PVC, left ventricular hypertrophy.  Personally reviewed  A/P  1 CAD-no recurrent symptoms. Continue ASA and statin.  2 Hypertension-BP controlled; continue present meds and follow.  3 hyperlipidemia-continue statin and zetia.   4 Bradycardia-longstanding; we will avoid AV nodal blocking agents.

## 2019-10-19 ENCOUNTER — Ambulatory Visit: Payer: Medicare Other | Admitting: Cardiology

## 2019-10-19 ENCOUNTER — Other Ambulatory Visit: Payer: Self-pay

## 2019-10-19 ENCOUNTER — Encounter: Payer: Self-pay | Admitting: Cardiology

## 2019-10-19 VITALS — BP 128/62 | HR 49 | Ht 70.0 in | Wt 233.8 lb

## 2019-10-19 DIAGNOSIS — Z9861 Coronary angioplasty status: Secondary | ICD-10-CM | POA: Diagnosis not present

## 2019-10-19 DIAGNOSIS — I2510 Atherosclerotic heart disease of native coronary artery without angina pectoris: Secondary | ICD-10-CM | POA: Diagnosis not present

## 2019-10-19 DIAGNOSIS — I10 Essential (primary) hypertension: Secondary | ICD-10-CM | POA: Diagnosis not present

## 2019-10-19 DIAGNOSIS — E785 Hyperlipidemia, unspecified: Secondary | ICD-10-CM

## 2019-10-19 NOTE — Patient Instructions (Signed)
°  Follow-Up: At [facility], you and your health needs are our priority.  As part of our continuing mission to provide you with exceptional heart care, we have created designated Provider Care Teams.  These Care Teams include your primary Cardiologist (physician) and Advanced Practice Providers (APPs -  Physician Assistants and Nurse Practitioners) who all work together to provide you with the care you need, when you need it.  We recommend signing up for the patient portal called "MyChart".  Sign up information is provided on this After Visit Summary.  MyChart is used to connect with patients for Virtual Visits (Telemedicine).  Patients are able to view lab/test results, encounter notes, upcoming appointments, etc.  Non-urgent messages can be sent to your provider as well.   To learn more about what you can do with MyChart, go to [email or web address].    Your next appointment:   12 month(s)  The format for your next appointment:   In Person  Provider:   BRIAN CRENSHAW MD     

## 2019-11-17 ENCOUNTER — Other Ambulatory Visit: Payer: Self-pay | Admitting: *Deleted

## 2019-11-17 ENCOUNTER — Other Ambulatory Visit: Payer: Self-pay | Admitting: Cardiology

## 2019-11-17 DIAGNOSIS — E785 Hyperlipidemia, unspecified: Secondary | ICD-10-CM

## 2019-11-17 MED ORDER — LISINOPRIL 10 MG PO TABS
ORAL_TABLET | ORAL | 3 refills | Status: DC
Start: 2019-11-17 — End: 2020-10-05

## 2019-11-17 NOTE — Telephone Encounter (Signed)
Good Morning,   I have a patient Alec Johnson ([id number]) here in the office in [facility] asking about a refill on his Lisinopril prescription. He says that he's been trying to get a refill for [facility] since mid September.   Do you have any information on this patient by chance? [facility] is telling him they've been trying to contact us and haven't heard anything.

## 2019-11-17 NOTE — Telephone Encounter (Signed)
Refill Alec Johnson  

## 2019-12-01 ENCOUNTER — Ambulatory Visit (HOSPITAL_BASED_OUTPATIENT_CLINIC_OR_DEPARTMENT_OTHER)
Admission: RE | Admit: 2019-12-01 | Discharge: 2019-12-01 | Disposition: A | Discharge status: Home / Self Care | Payer: Medicare Other | Source: Ambulatory Visit | Attending: Acute Care | Admitting: Acute Care

## 2019-12-01 ENCOUNTER — Other Ambulatory Visit: Payer: Self-pay

## 2019-12-01 DIAGNOSIS — Z87891 Personal history of nicotine dependence: Secondary | ICD-10-CM | POA: Diagnosis present

## 2019-12-01 DIAGNOSIS — Z122 Encounter for screening for malignant neoplasm of respiratory organs: Secondary | ICD-10-CM | POA: Insufficient documentation

## 2019-12-01 IMAGING — CT CT CHEST LUNG CANCER SCREENING LOW DOSE W/O CM
2 of 5 series · 15 of 40 positions shown, 18 images · non-contrast
Comparison: Chest CT [DATE].

CLINICAL DATA: 69-year-old male former smoker (quit 8 years ago)
with 92 pack-year history of smoking. Lung cancer screening
examination.

EXAM:
CT CHEST WITHOUT CONTRAST LOW-DOSE FOR LUNG CANCER SCREENING
TECHNIQUE: Multidetector CT imaging of the chest was performed following the
standard protocol without IV contrast.

[Series 3: lungs · axial · 0.87mm/px · z∈[-323,-20]mm · 12 of 335 slices shown, 15 images]
[im 16/335  mediastinal]
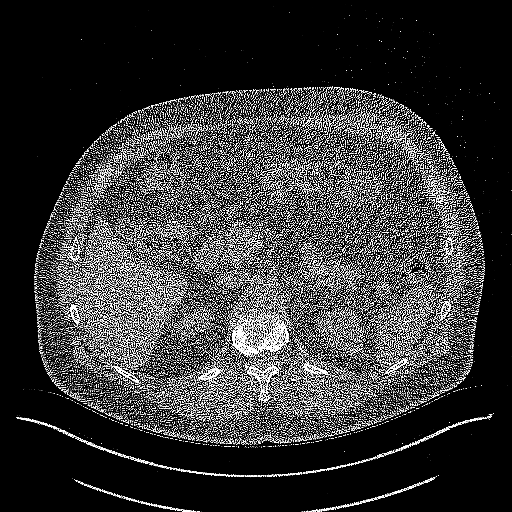
[im 16/335  lung]
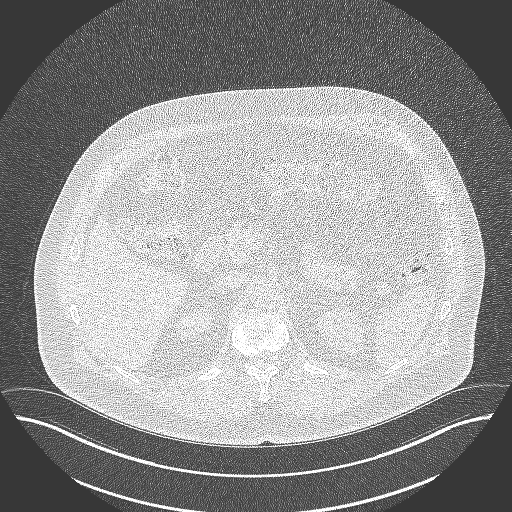
[im 48/335  lung]
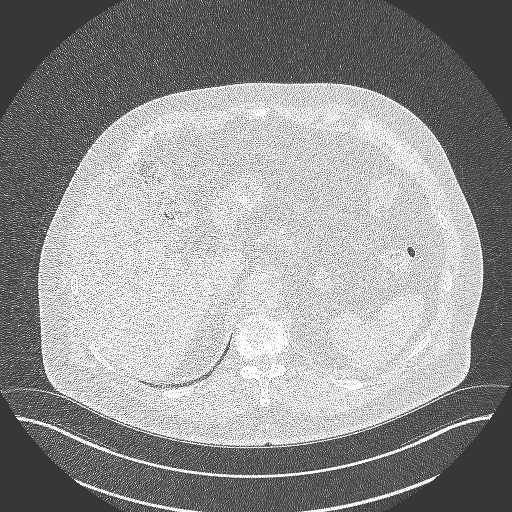
[im 80/335  lung]
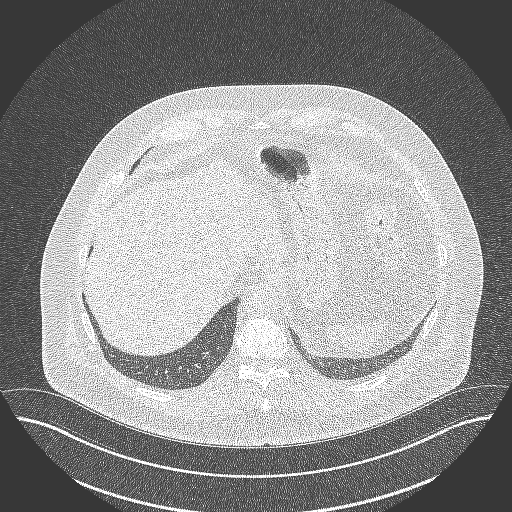
[im 96/335  lung]
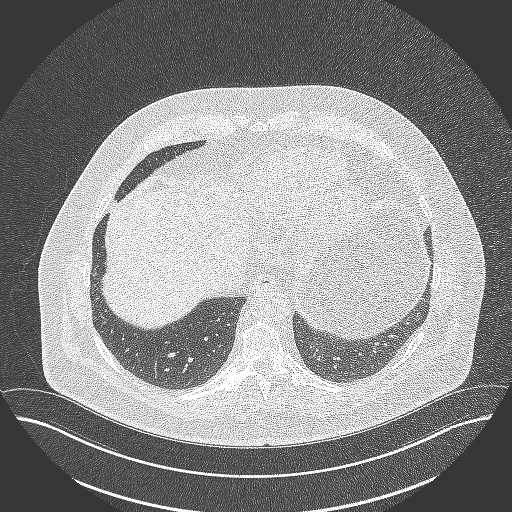
[im 128/335  mediastinal]
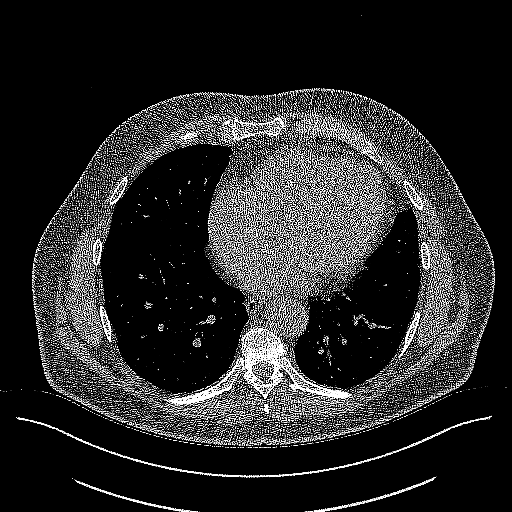
[im 128/335  lung]
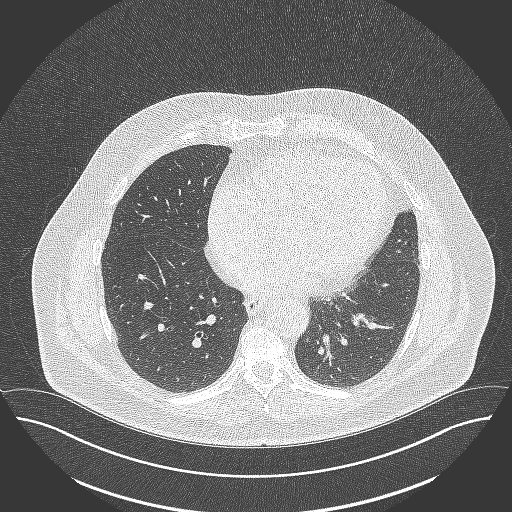
[im 160/335  lung]
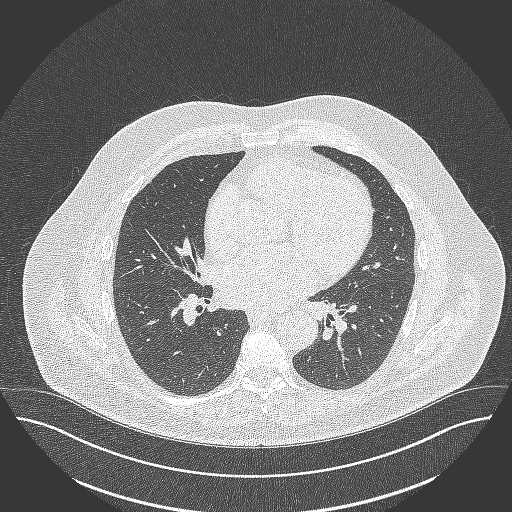
[im 175/335  lung]
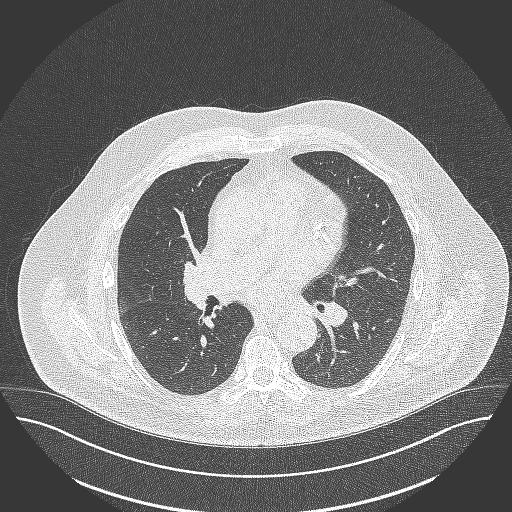
[im 207/335  lung]
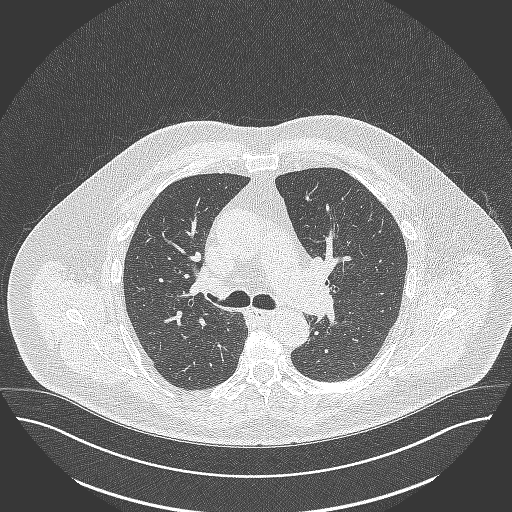
[im 239/335  mediastinal]
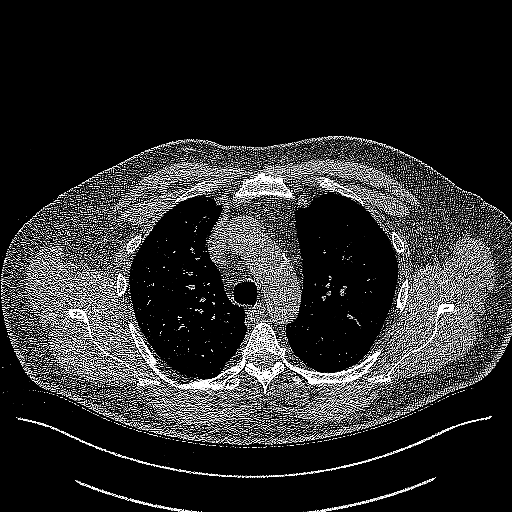
[im 239/335  lung]
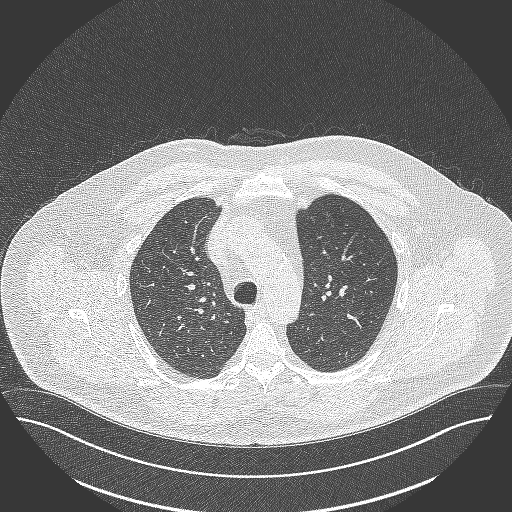
[im 255/335  lung]
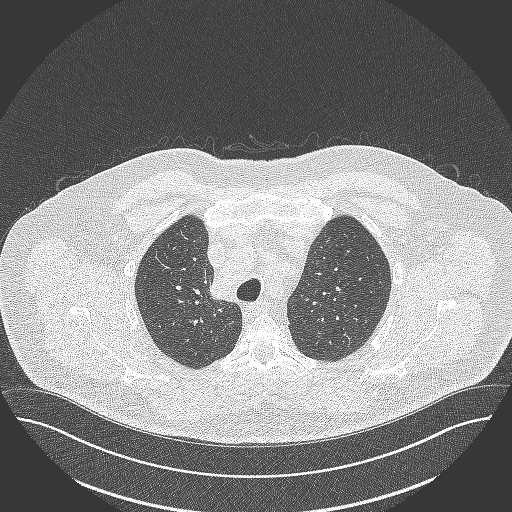
[im 287/335  lung]
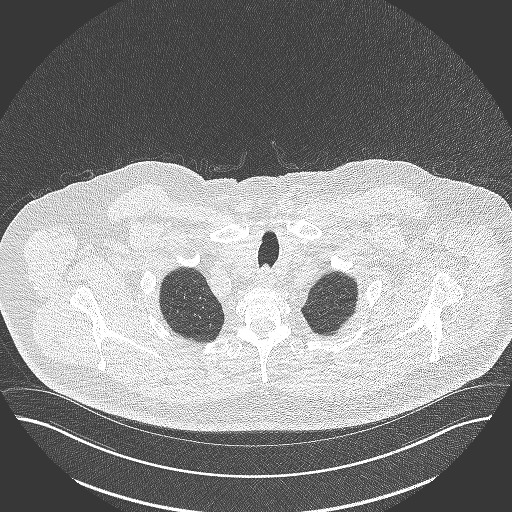
[im 319/335  lung]
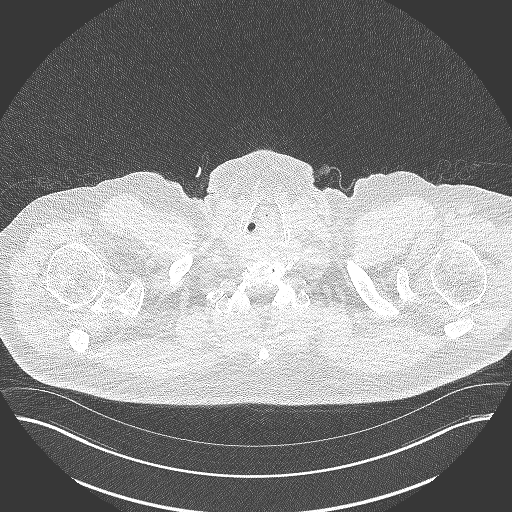

[Series 4: coronal · coronal · 0.68mm/px · 3 of 312 slices shown]
[im 63/312  lung]
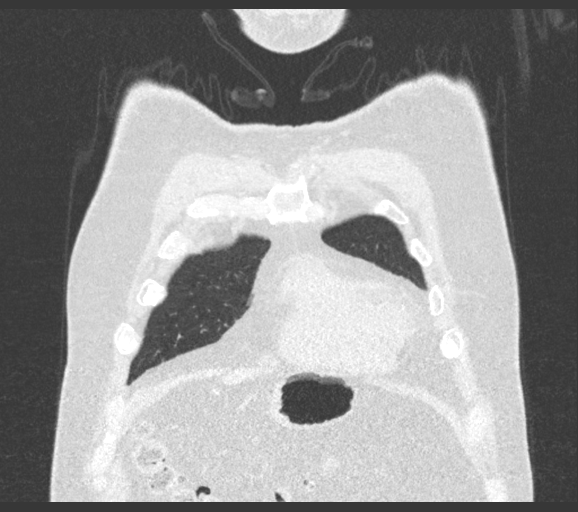
[im 125/312  lung]
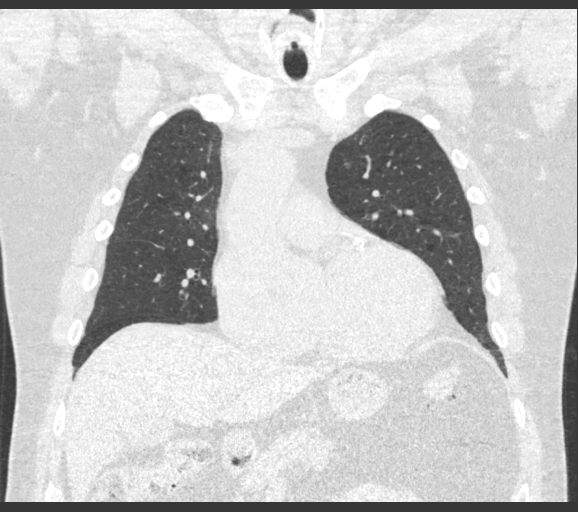
[im 187/312  lung]
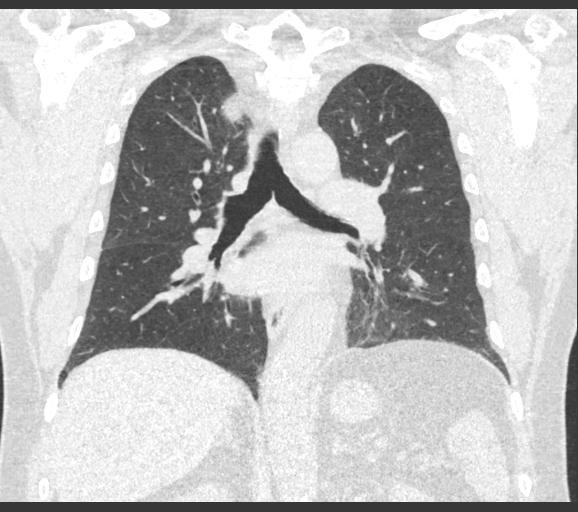

[15 of 40 positions shown; findings below may reference images not displayed]

FINDINGS: Cardiovascular: Heart size is normal. There is no significant
pericardial fluid, thickening or pericardial calcification. There is
aortic atherosclerosis, as well as atherosclerosis of the great
vessels of the mediastinum and the coronary arteries, including
calcified atherosclerotic plaque in the left anterior descending and
right coronary arteries.

Mediastinum/Nodes: No pathologically enlarged mediastinal or hilar
lymph nodes. Please note that accurate exclusion of hilar adenopathy
is limited on noncontrast CT scans. Esophagus is unremarkable in
appearance. No axillary lymphadenopathy.

Lungs/Pleura: Multiple tiny pulmonary nodules are noted throughout
the lungs bilaterally, largest of which is in the right middle lobe
(axial image 169 of series 3), with a volume derived mean diameter
of 4.6 mm. No other larger more suspicious appearing pulmonary
nodules or masses are noted. No acute consolidative airspace
disease. No pleural effusions. Diffuse bronchial wall thickening
with mild centrilobular and paraseptal emphysema.

Upper Abdomen: Unremarkable.

Musculoskeletal: There are no aggressive appearing lytic or blastic
lesions noted in the visualized portions of the skeleton.
IMPRESSION: 1. Lung-RADS 2S, benign appearance or behavior. Continue annual
screening with low-dose chest CT without contrast in 12 months.
2. The "S" modifier above refers to potentially clinically
significant non lung cancer related findings. Specifically, there is
aortic atherosclerosis, in addition to two vessel coronary artery
disease. Please note that although the presence of coronary artery
calcium documents the presence of coronary artery disease, the
severity of this disease and any potential stenosis cannot be
assessed on this non-gated CT examination. Assessment for potential
risk factor modification, dietary therapy or pharmacologic therapy
may be warranted, if clinically indicated.
3. Mild diffuse bronchial wall thickening with mild centrilobular
and paraseptal emphysema; imaging findings suggestive of underlying
COPD.

Aortic Atherosclerosis ([DO]-[DO]) and Emphysema ([DO]-[DO]).

## 2019-12-05 NOTE — Progress Notes (Signed)
Alec Johnson, this patient is followed by cardiology and he had an echo in 2019.  Thanks so much

## 2019-12-05 NOTE — Progress Notes (Signed)

## 2019-12-06 ENCOUNTER — Other Ambulatory Visit: Payer: Self-pay | Admitting: *Deleted

## 2019-12-06 DIAGNOSIS — Z87891 Personal history of nicotine dependence: Secondary | ICD-10-CM

## 2019-12-26 ENCOUNTER — Telehealth: Payer: Self-pay | Admitting: Cardiology

## 2019-12-26 NOTE — Telephone Encounter (Signed)
Spoke with pt, he reports since Thursday last week he has not felt right. He reports new SOB when walking behind the lawn mower that goes away with rest. He reports  Right sided chest pain that is a 3 to 4 on scale 1-10. There is nothing that makes the chest pain better or worse. It is similar to his pain prior to his MI but not as bad. His bp is running from 170/80 to 105/64. He reports the pain in the chest has been consistent for the last 6 hours. He took Rolaids with no change. He has not taken NTG. He has changed his diet recently and he has lost 9 lbs in the last week. He is belching some but no change in pain. He will try Pepto  To see if that helps. He will see dr Harrell Gave the DOD tomorrow morning. Patient voiced understanding to take NTG and go to the ER if the discomfort worsens.

## 2019-12-26 NOTE — Telephone Encounter (Signed)
Pt c/o BP issue: STAT if pt c/o blurred vision, one-sided weakness or slurred speech  1. What are your last 5 BP readings? Now 168/85  2. Are you having any other symptoms (ex. Dizziness, headache, blurred vision, passed out)? Dizzy headaches   3. What is your BP issue? It keeps going up and down

## 2019-12-27 ENCOUNTER — Telehealth: Payer: Self-pay | Admitting: Cardiology

## 2019-12-27 ENCOUNTER — Encounter (HOSPITAL_COMMUNITY): Payer: Self-pay | Admitting: Cardiology

## 2019-12-27 ENCOUNTER — Encounter: Payer: Self-pay | Admitting: Cardiology

## 2019-12-27 ENCOUNTER — Observation Stay (HOSPITAL_COMMUNITY)
Admission: EM | Admit: 2019-12-27 | Discharge: 2019-12-28 | Disposition: A | Discharge status: Home / Self Care | Payer: Medicare Other | Attending: Cardiology | Admitting: Cardiology

## 2019-12-27 ENCOUNTER — Other Ambulatory Visit: Payer: Self-pay

## 2019-12-27 ENCOUNTER — Emergency Department (HOSPITAL_COMMUNITY): Payer: Medicare Other

## 2019-12-27 ENCOUNTER — Ambulatory Visit: Payer: Medicare Other | Admitting: Cardiology

## 2019-12-27 VITALS — BP 134/72 | HR 51 | Ht 70.0 in | Wt 226.0 lb

## 2019-12-27 DIAGNOSIS — R079 Chest pain, unspecified: Secondary | ICD-10-CM

## 2019-12-27 DIAGNOSIS — I2510 Atherosclerotic heart disease of native coronary artery without angina pectoris: Secondary | ICD-10-CM

## 2019-12-27 DIAGNOSIS — Z9861 Coronary angioplasty status: Secondary | ICD-10-CM

## 2019-12-27 DIAGNOSIS — R072 Precordial pain: Secondary | ICD-10-CM

## 2019-12-27 DIAGNOSIS — Z20822 Contact with and (suspected) exposure to covid-19: Secondary | ICD-10-CM | POA: Diagnosis not present

## 2019-12-27 DIAGNOSIS — I10 Essential (primary) hypertension: Secondary | ICD-10-CM | POA: Diagnosis not present

## 2019-12-27 DIAGNOSIS — Z720 Tobacco use: Secondary | ICD-10-CM | POA: Diagnosis present

## 2019-12-27 DIAGNOSIS — Z7982 Long term (current) use of aspirin: Secondary | ICD-10-CM | POA: Diagnosis not present

## 2019-12-27 DIAGNOSIS — E785 Hyperlipidemia, unspecified: Secondary | ICD-10-CM | POA: Diagnosis present

## 2019-12-27 DIAGNOSIS — R0600 Dyspnea, unspecified: Secondary | ICD-10-CM | POA: Diagnosis present

## 2019-12-27 DIAGNOSIS — K219 Gastro-esophageal reflux disease without esophagitis: Secondary | ICD-10-CM | POA: Diagnosis present

## 2019-12-27 DIAGNOSIS — Z955 Presence of coronary angioplasty implant and graft: Secondary | ICD-10-CM | POA: Diagnosis not present

## 2019-12-27 DIAGNOSIS — R9431 Abnormal electrocardiogram [ECG] [EKG]: Secondary | ICD-10-CM

## 2019-12-27 DIAGNOSIS — R0789 Other chest pain: Principal | ICD-10-CM | POA: Insufficient documentation

## 2019-12-27 DIAGNOSIS — Z87891 Personal history of nicotine dependence: Secondary | ICD-10-CM | POA: Diagnosis not present

## 2019-12-27 DIAGNOSIS — I252 Old myocardial infarction: Secondary | ICD-10-CM

## 2019-12-27 LAB — BASIC METABOLIC PANEL
Anion gap: 7 (ref 5–15)
BUN: 25 mg/dL — ABNORMAL HIGH (ref 8–23)
CO2: 29 mmol/L (ref 22–32)
Calcium: 9.4 mg/dL (ref 8.9–10.3)
Chloride: 103 mmol/L (ref 98–111)
Creatinine, Ser: 1.06 mg/dL (ref 0.61–1.24)
GFR, Estimated: >60 mL/min (ref >60)
Glucose, Bld: 115 mg/dL — ABNORMAL HIGH (ref 70–99)
Potassium: 4.8 mmol/L (ref 3.5–5.1)
Sodium: 139 mmol/L (ref 135–145)

## 2019-12-27 LAB — CBC
HCT: 47.8 % (ref 39.0–52.0)
HCT: 46.2 % (ref 39.0–52.0)
Hemoglobin: 15.8 g/dL (ref 13.0–17.0)
Hemoglobin: 14.7 g/dL (ref 13.0–17.0)
MCH: 32.2 pg (ref 26.0–34.0)
MCH: 31.5 pg (ref 26.0–34.0)
MCHC: 33.1 g/dL (ref 30.0–36.0)
MCHC: 31.8 g/dL (ref 30.0–36.0)
MCV: 97.4 fL (ref 80.0–100.0)
MCV: 98.9 fL (ref 80.0–100.0)
Platelets: 196 10*3/uL (ref 150–400)
Platelets: 171 10*3/uL (ref 150–400)
RBC: 4.91 MIL/uL (ref 4.22–5.81)
RBC: 4.67 MIL/uL (ref 4.22–5.81)
RDW: 12.6 % (ref 11.5–15.5)
RDW: 12.6 % (ref 11.5–15.5)
WBC: 9.9 10*3/uL (ref 4.0–10.5)
WBC: 8.9 10*3/uL (ref 4.0–10.5)
nRBC: 0 % (ref 0.0–0.2)
nRBC: 0 % (ref 0.0–0.2)

## 2019-12-27 LAB — RESP PANEL BY RT-PCR (FLU A&B, COVID) ARPGX2
Influenza A by PCR: NEGATIVE
Influenza B by PCR: NEGATIVE
SARS Coronavirus 2 by RT PCR: NEGATIVE

## 2019-12-27 LAB — TROPONIN I (HIGH SENSITIVITY)
Troponin I (High Sensitivity): 17 ng/L (ref <18)
Troponin I (High Sensitivity): 17 ng/L (ref <18)
Troponin I (High Sensitivity): 19 ng/L — ABNORMAL HIGH (ref <18)

## 2019-12-27 LAB — CREATININE, SERUM
Creatinine, Ser: 1.03 mg/dL (ref 0.61–1.24)
GFR, Estimated: >60 mL/min (ref >60)

## 2019-12-27 LAB — D-DIMER, QUANTITATIVE: D-Dimer, Quant: 0.48 ug/mL-FEU (ref 0.00–0.50)

## 2019-12-27 IMAGING — CR DG CHEST 2V
2 series · 2 of 2 positions shown · non-contrast
Comparison: CT [DATE].

CLINICAL DATA: Chest pain.

EXAM:
CHEST - 2 VIEW

[chest pa]
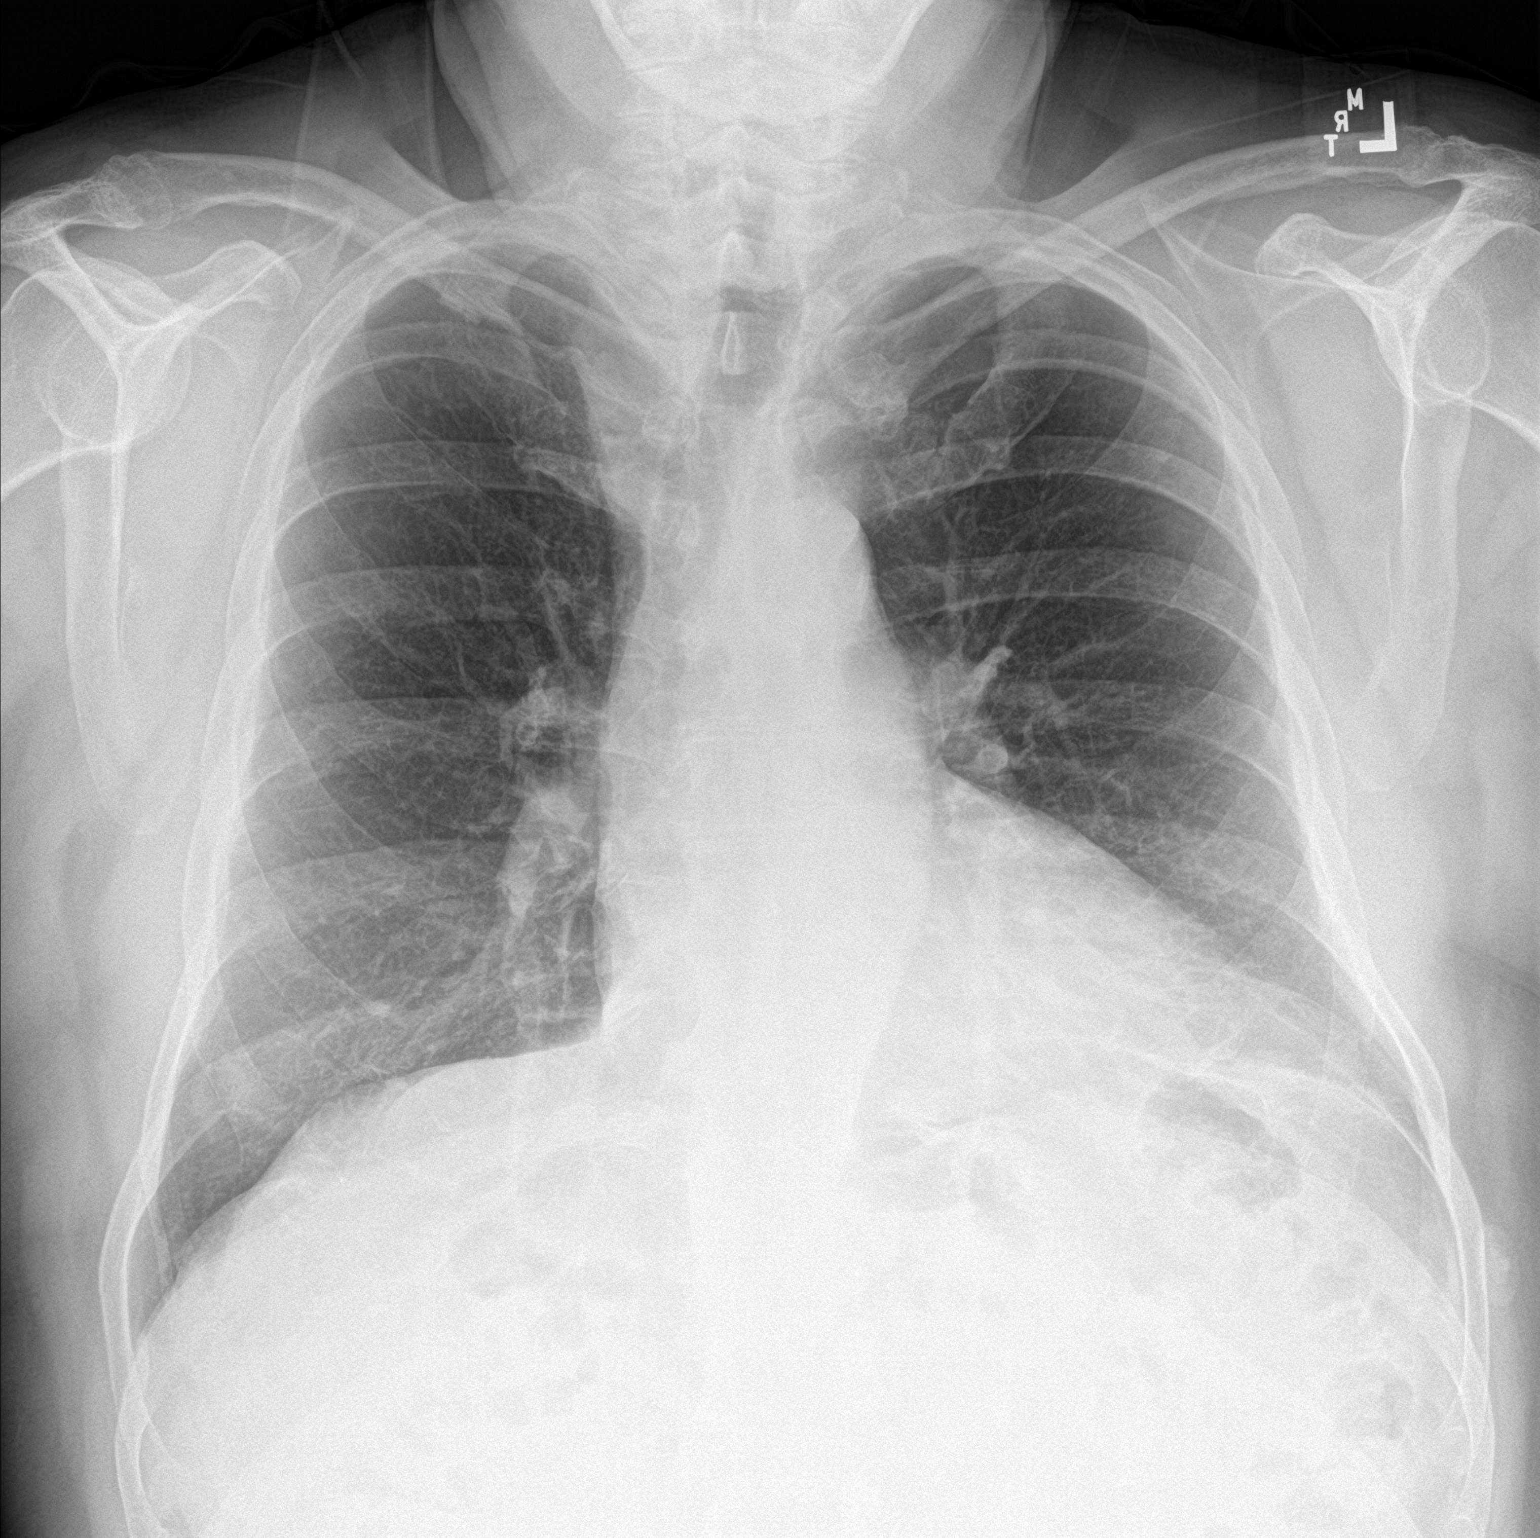

[chest lat]
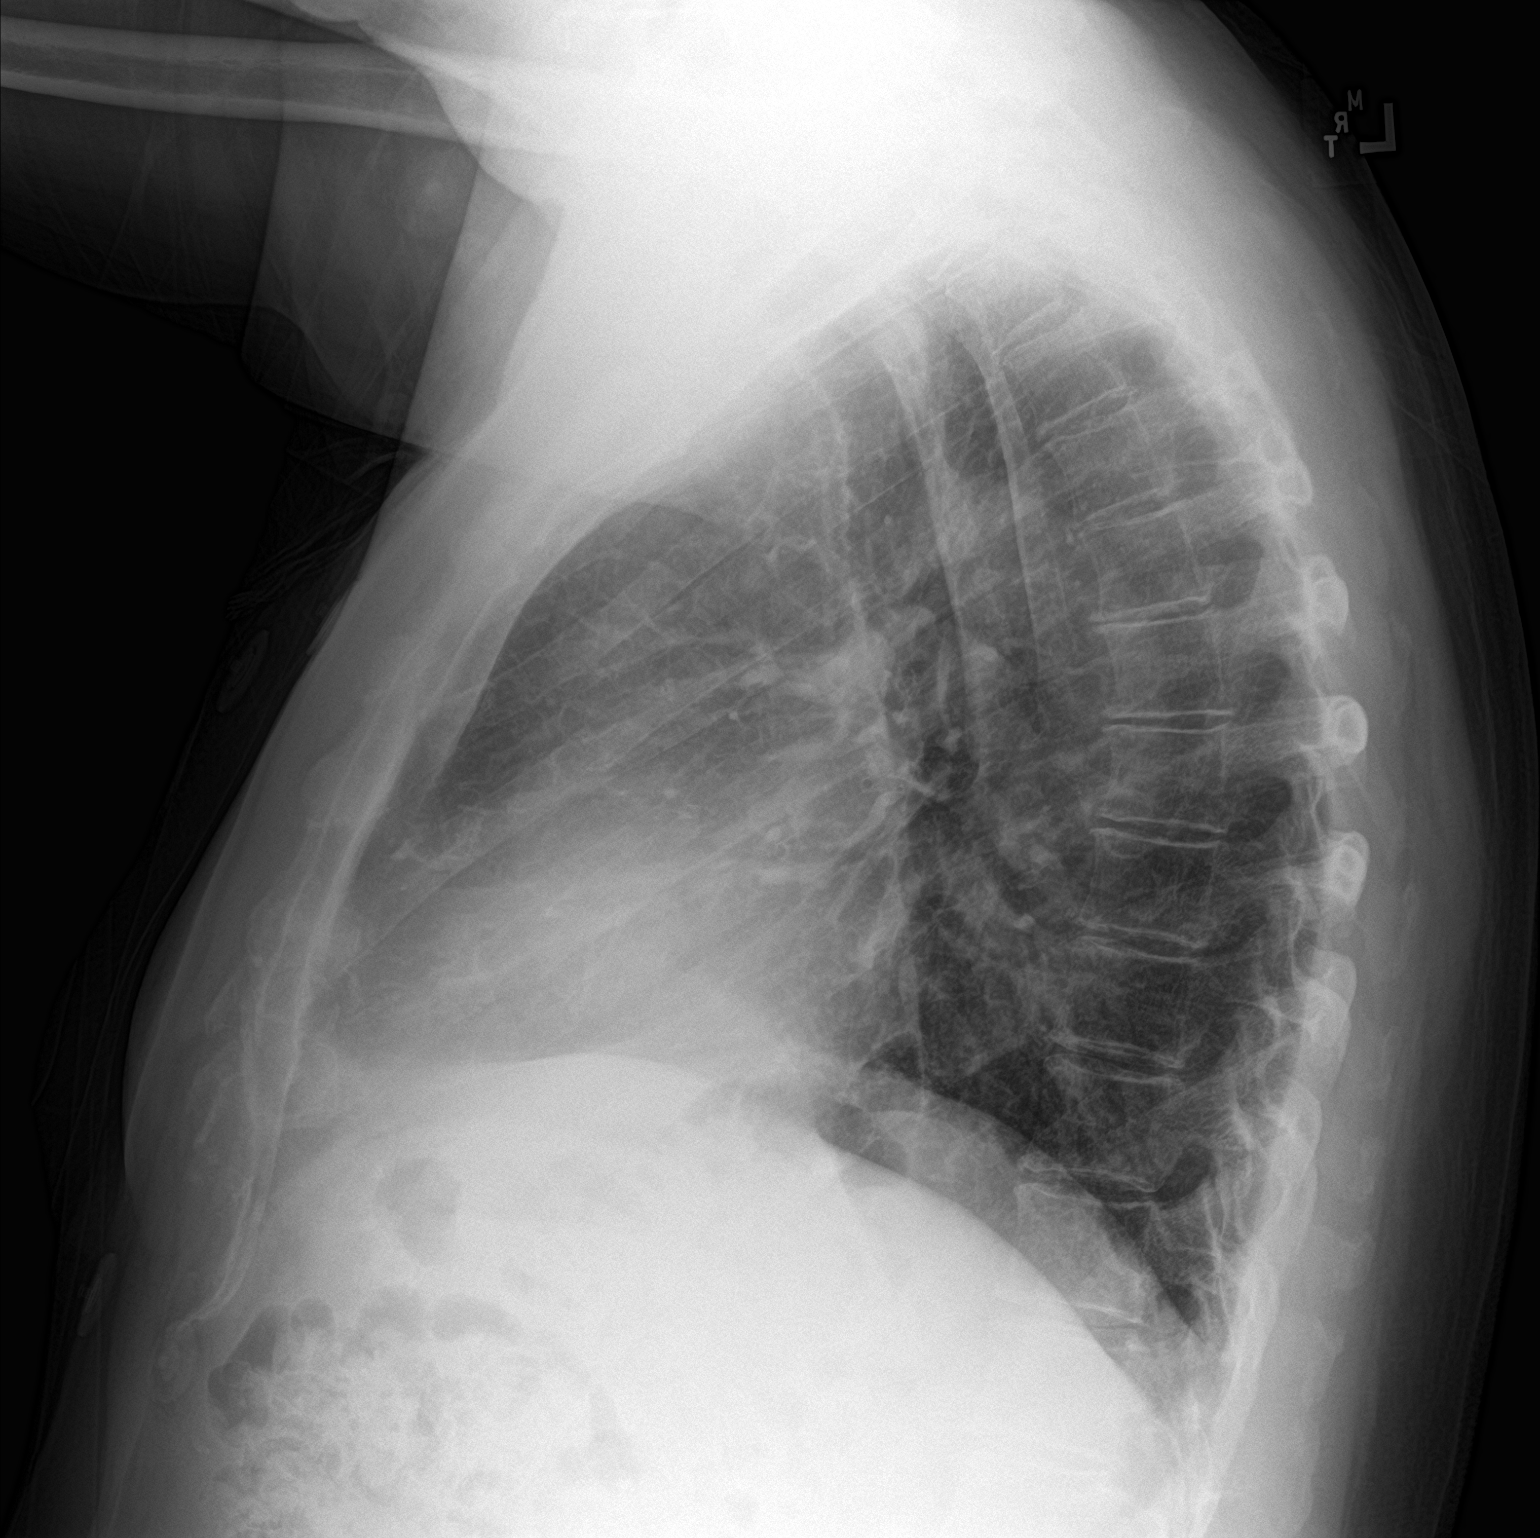

[2 of 2 positions shown; findings below may reference images not displayed]

FINDINGS: Mediastinum and hilar structures normal. Heart size normal. Low lung
volumes. Lungs are clear of acute infiltrates. Tiny calcified
nodular density projected over the left upper lung most likely tiny
calcified granuloma. No focal infiltrate. No pleural effusion or
pneumothorax. Degenerative change thoracic spine.
IMPRESSION: No acute cardiopulmonary disease.

## 2019-12-27 MED ORDER — ATORVASTATIN CALCIUM 80 MG PO TABS
80.0000 mg | ORAL_TABLET | Freq: Every day | ORAL | Status: DC
  Filled 2019-12-27: qty 1

## 2019-12-27 MED ORDER — EZETIMIBE 10 MG PO TABS
10.0000 mg | ORAL_TABLET | Freq: Every day | ORAL | Status: DC
  Administered 2019-12-28: 10 mg via ORAL
  Filled 2019-12-27 (×2): qty 1

## 2019-12-27 MED ORDER — ASPIRIN 300 MG RE SUPP: 300.0000 mg | RECTAL | Status: DC

## 2019-12-27 MED ORDER — NITROGLYCERIN 0.4 MG SL SUBL: 0.4000 mg | SUBLINGUAL_TABLET | SUBLINGUAL | 1 refills | Status: AC | PRN

## 2019-12-27 MED ORDER — NITROGLYCERIN 0.4 MG SL SUBL: 0.4000 mg | SUBLINGUAL_TABLET | SUBLINGUAL | Status: DC | PRN

## 2019-12-27 MED ORDER — LISINOPRIL 10 MG PO TABS
10.0000 mg | ORAL_TABLET | Freq: Every day | ORAL | Status: DC
  Filled 2019-12-27: qty 1

## 2019-12-27 MED ORDER — ASPIRIN EC 81 MG PO TBEC
81.0000 mg | DELAYED_RELEASE_TABLET | Freq: Every day | ORAL | Status: DC
  Administered 2019-12-28: 81 mg via ORAL
  Filled 2019-12-27: qty 1

## 2019-12-27 MED ORDER — ASPIRIN 81 MG PO CHEW: 324.0000 mg | CHEWABLE_TABLET | ORAL | Status: DC

## 2019-12-27 MED ORDER — ACETAMINOPHEN 325 MG PO TABS: 650.0000 mg | ORAL_TABLET | ORAL | Status: DC | PRN

## 2019-12-27 MED ORDER — ASPIRIN 325 MG PO TABS
325.0000 mg | ORAL_TABLET | Freq: Once | ORAL | Status: AC
  Administered 2019-12-27: 325 mg via ORAL
  Filled 2019-12-27: qty 1

## 2019-12-27 MED ORDER — ONDANSETRON HCL 4 MG/2ML IJ SOLN: 4.0000 mg | Freq: Four times a day (QID) | INTRAMUSCULAR | Status: DC | PRN

## 2019-12-27 MED ORDER — HEPARIN SODIUM (PORCINE) 5000 UNIT/ML IJ SOLN: 5000.0000 [IU] | Freq: Three times a day (TID) | INTRAMUSCULAR | Status: DC

## 2019-12-27 NOTE — ED Notes (Signed)
Per patient request, 4th troponin will be drawn with AM labs as patient does not want to be stuck multiple times.

## 2019-12-27 NOTE — Telephone Encounter (Signed)
Patient states he is in the lobby of the ED and was told to have the nurses there call Dr. Stanford Breed so he can come and see him. He states they told him they won't call Dr. Stanford Breed until he has a room, which may not be for another 5-6 hours. He would like someone to call Dr. Stanford Breed and let him know he is at the ED to speed things up for him.

## 2019-12-27 NOTE — Telephone Encounter (Signed)
Spoke with pt, aware I will let dr Stanford Breed know he is in the ER waiting. Dr Harrell Gave had told him if we let dr Stanford Breed know he maybe able to speed things up some. Advised patient it still maybe 5-6 hours before he is seen.

## 2019-12-27 NOTE — ED Notes (Signed)
Patient reports he was told that he is here for overnight observation and requesting not to have IV access at this time. Patient states he is willing to be straight stuck for lab work throughout visit. Patient currently without access per his wishes.

## 2019-12-27 NOTE — ED Triage Notes (Signed)
Pt here sent by Dr. Jacalyn Lefevre office for eval of L sided non radiating chest pain x 5-6 days with associated shob and headache.

## 2019-12-27 NOTE — Patient Instructions (Signed)
Dr. Harrell Gave recommend you report to emergency room for further evaluations.  Please tell triage nurse you are a heart patient, has history of CAD, experiencing chest pain, and sent by cardiologist (doctor of the day).    Medication Instructions:  Your Physician recommend you continue on your current medication as directed.    *If you need a refill on your cardiac medications before your next appointment, please call your pharmacy*   Lab Work: None   Testing/Procedures: None   Follow-Up: At [facility], you and your health needs are our priority.  As part of our continuing mission to provide you with exceptional heart care, we have created designated Provider Care Teams.  These Care Teams include your primary Cardiologist (physician) and Advanced Practice Providers (APPs -  Physician Assistants and Nurse Practitioners) who all work together to provide you with the care you need, when you need it.  We recommend signing up for the patient portal called "MyChart".  Sign up information is provided on this After Visit Summary.  MyChart is used to connect with patients for Virtual Visits (Telemedicine).  Patients are able to view lab/test results, encounter notes, upcoming appointments, etc.  Non-urgent messages can be sent to your provider as well.   To learn more about what you can do with MyChart, go to [email or web address].    Your next appointment:   Based on ER visit  The format for your next appointment:   In Person  Provider:   Kirk Ruths, MD

## 2019-12-27 NOTE — Progress Notes (Signed)
Cardiology Office Note:    Date:  12/27/2019   ID:  Alec Johnson, DOB 07-27-1950, MRN [id number]  PCP:  Katherina Mires, MD  Cardiologist:  Kirk Ruths, MD  Referring MD: Katherina Mires, MD   CC: urgent visit for chest pain  History of Present Illness:    Alec Johnson is a 69 y.o. male with a hx of CAD as below seen for an urgent visit for chest pain. He follows with Dr. Stanford Breed.  CAD history per Dr. Jacalyn Lefevre last note 10/19/19: "Admitted September 2019 with acute infarct. Cardiac catheterization revealed an occluded RCA with left to right collaterals; 100% ostial ramus;normal LV function; PCI of ramus with drug-eluting stent. Echocardiogram October 2019 showed vigorous LV function, grade 2 diastolic dysfunction and biatrial enlargement"  For the last 1-2 weeks, feels short of breath with even minimal exertion. Has had headache, left and right sided chest pressure that has been nearly constant. Pain woke him from sleep today. Noted last week when mowing the lawn with an automatic mower, had to stop twice (usually doesn't have to stop at all, has a small yard). Has felt dizzy but no full syncope. Had an episode a month ago when he felt so bad he had to pull over the side of the road. No PND or orthopnea, but can be short of breath just walking to the bathroom. No edema, has lost about 10 lbs in the last two weeks. No cough or sputum. No nausea but has woken up with night sweats several nights in the last few weeks. No fevers or chills. Has had severe muscle cramps in both legs the last 4 nights, better with hot packs.   We discussed his pain at length today. This is similar to his pain in 2019, though without the arm pain. That pain was rapid/severe where the current pain has been more steady. Has missed no medication. See below, discussed need for further evaluation.  Past Medical History:  Diagnosis Date  . Colon polyp   . Diverticular disease   . Erectile dysfunction   . GERD  (gastroesophageal reflux disease)    NO Recent problems  . Hyperlipidemia   . Hyperlipidemia   . Osteoarthritis   . Seborrheic keratosis   . STEMI (ST elevation myocardial infarction) ([place])    10/19/17 PCI/DES to ramus with CTO of the mRCA with collaterals, normal EF    Past Surgical History:  Procedure Laterality Date  . athroscopic knee surgery     BIL KNEES  . CORONARY/GRAFT ACUTE MI REVASCULARIZATION N/A 10/19/2017   Procedure: Coronary/Graft Acute MI Revascularization;  Surgeon: Jettie Booze, MD;  Location: [facility];  Service: Cardiovascular;  Laterality: N/A;  . LEFT HEART CATH AND CORONARY ANGIOGRAPHY N/A 10/19/2017   Procedure: LEFT HEART CATH AND CORONARY ANGIOGRAPHY;  Surgeon: Jettie Booze, MD;  Location: [facility];  Service: Cardiovascular;  Laterality: N/A;  . LEG SURGERY  1966   PINNING DUE TO INJURY  . NOSE SURGERY    . TOTAL KNEE ARTHROPLASTY Right 01/18/2014   Procedure: RIGHT TOTAL KNEE ARTHROPLASTY;  Surgeon: Tobi Bastos, MD;  Location: [facility];  Service: Orthopedics;  Laterality: Right;    Current Medications: Current Outpatient Medications on File Prior to Visit  Medication Sig  . acetaminophen (TYLENOL) 500 MG tablet Take 1,000 mg by mouth every 6 (six) hours as needed for mild pain or moderate pain.  Marland Kitchen aspirin EC 81 MG tablet Take 81 mg  by mouth daily.  Marland Kitchen atorvastatin (LIPITOR) 80 MG tablet TAKE 1 TABLET BY MOUTH  DAILY AT 6 PM  . Cholecalciferol (VITAMIN D3) 5000 units TABS Take 5,000 Units by mouth 3 (three) times a week.   . ezetimibe (ZETIA) 10 MG tablet TAKE 1 TABLET BY MOUTH  DAILY  . Glucosamine HCl (GLUCOSAMINE PO) Take 500 mg by mouth daily.  Marland Kitchen lisinopril (ZESTRIL) 10 MG tablet TAKE 1 TABLET BY MOUTH  DAILY  . meloxicam (MOBIC) 15 MG tablet Take 15 mg by mouth daily.  . fluticasone (FLONASE) 50 MCG/ACT nasal spray Place 2 sprays into both nostrils daily.   No current facility-administered medications on file  prior to visit.     Allergies:   Doxycycline hyclate, Penicillins, and Rifampin   Social History   Tobacco Use  . Smoking status: Former Smoker    Packs/day: 2.00    Years: 45.00    Pack years: 90.00    Types: Cigarettes    Quit date: 01/11/2012    Years since quitting: 7.9  . Smokeless tobacco: Never Used  . Tobacco comment: He has quit smoking.    Substance Use Topics  . Alcohol use: Yes    Comment: Occasional  . Drug use: No    Family History: family history includes Cancer in his father and paternal grandfather; Coronary artery disease in his brother; Heart attack in his brother and paternal grandfather; Stroke in his mother.  ROS:   Please see the history of present illness.  Additional pertinent ROS: Constitutional: Negative for chills, fever, Positive for night sweats, unintentional weight loss  HENT: Negative for ear pain and hearing loss.   Eyes: Negative for loss of vision and eye pain.  Respiratory: Negative for cough, sputum, wheezing.   Cardiovascular: See HPI. Gastrointestinal: Negative for abdominal pain, melena, and hematochezia.  Genitourinary: Negative for dysuria and hematuria.  Musculoskeletal: Negative for falls and myalgias.  Skin: Negative for itching and rash.  Neurological: Negative for focal weakness, focal sensory changes and loss of consciousness.  Endo/Heme/Allergies: Does not bruise/bleed easily.     EKGs/Labs/Other Studies Reviewed:    The following studies were reviewed today: Prior cath and echo.   EKG:  EKG is personally reviewed.  The ekg ordered today demonstrates sinus bradycardia at 51 bpm. The t wave inversion in lead III is new since prior.  Recent Labs: 03/30/2019: ALT 24; BUN 13; Creatinine, Ser 0.95; Potassium 4.5; Sodium 142  Recent Lipid Panel    Component Value Date/Time   CHOL 100 03/30/2019 0902   TRIG 36 03/30/2019 0902   HDL 40 03/30/2019 0902   CHOLHDL 2.5 03/30/2019 0902   CHOLHDL 3.7 10/20/2017 0232   VLDL  17 10/20/2017 0232   LDLCALC 50 03/30/2019 0902    Physical Exam:    VS:  BP 134/72   Pulse (!) 51   Ht 5\' 10"  (1.778 m)   Wt 226 lb (102.5 kg)   SpO2 96%   BMI 32.43 kg/m     Wt Readings from Last 3 Encounters:  12/27/19 226 lb (102.5 kg)  10/19/19 233 lb 12.8 oz (106.1 kg)  03/30/19 240 lb 1.9 oz (108.9 kg)    GEN: Well nourished, well developed in no acute distress HEENT: Normal, moist mucous membranes NECK: No JVD CARDIAC: regular rhythm, normal S1 and S2, no rubs or gallops. No murmur. VASCULAR: Radial and DP pulses 2+ bilaterally. No carotid bruits RESPIRATORY:  Clear to auscultation without rales, wheezing or rhonchi  ABDOMEN: Soft,  non-tender, non-distended MUSCULOSKELETAL:  Ambulates independently SKIN: Warm and dry, no edema NEUROLOGIC:  Alert and oriented x 3. No focal neuro deficits noted. PSYCHIATRIC:  Normal affect    ASSESSMENT:    1. Chest pain, unspecified type   2. CAD S/P percutaneous coronary angioplasty   3. Essential hypertension   4. Dyslipidemia, goal LDL below 70   5. Abnormal ECG    PLAN:    Progressive chest pain History of CAD s/p prior PCI Change in ECG Risk factors including hypertension, hyperlipidemia -his symptoms are very concerning. There is no tenderness to palpation over the anterior chest but is mildly tender over the back (this is different pain that what he is bothered by). -we discussed given his CAD history, this is best evaluated in the ER -he is hesitant to go to the ER, but I cannot exclude MI or PE in the office.  -after shared decision making, he will go to the ER. I have alerted our admission coordinator and Dr. Stanford Breed to let them know he is coming.  -high risk for cardiac etiology, this needs to be excluded urgently. He declines EMS travel and will drive himself to [facility] ER.  Plan for follow up: TBD based on evaluation  High risk medical condition needing urgent evaluation Total time of encounter: 40 minutes  total time of encounter, including 30 minutes spent in face-to-face patient care. This time includes coordination of care and counseling regarding chest pain. Remainder of non-face-to-face time involved reviewing chart documents/testing relevant to the patient encounter and documentation in the medical record.  Buford Dresser, MD, PhD [place]  [facility]    Medication Adjustments/Labs and Tests Ordered: Current medicines are reviewed at length with the patient today.  Concerns regarding medicines are outlined above.  Orders Placed This Encounter  Procedures  . EKG 12-Lead   Meds ordered this encounter  Medications  . nitroGLYCERIN (NITROSTAT) 0.4 MG SL tablet    Sig: Place 1 tablet (0.4 mg total) under the tongue every 5 (five) minutes x 3 doses as needed for chest pain.    Dispense:  25 tablet    Refill:  1    Patient Instructions  Dr. Harrell Gave recommend you report to emergency room for further evaluations.  Please tell triage nurse you are a heart patient, has history of CAD, experiencing chest pain, and sent by cardiologist (doctor of the day).    Medication Instructions:  Your Physician recommend you continue on your current medication as directed.    *If you need a refill on your cardiac medications before your next appointment, please call your pharmacy*   Lab Work: None   Testing/Procedures: None   Follow-Up: At [facility], you and your health needs are our priority.  As part of our continuing mission to provide you with exceptional heart care, we have created designated Provider Care Teams.  These Care Teams include your primary Cardiologist (physician) and Advanced Practice Providers (APPs -  Physician Assistants and Nurse Practitioners) who all work together to provide you with the care you need, when you need it.  We recommend signing up for the patient portal called "MyChart".  Sign up information is provided on this After Visit Summary.   MyChart is used to connect with patients for Virtual Visits (Telemedicine).  Patients are able to view lab/test results, encounter notes, upcoming appointments, etc.  Non-urgent messages can be sent to your provider as well.   To learn more about what you can do with MyChart,  go to [email or web address].    Your next appointment:   Based on ER visit  The format for your next appointment:   In Person  Provider:   Kirk Ruths, MD      Signed, Buford Dresser, MD PhD 12/27/2019 11:58 AM    [place]

## 2019-12-27 NOTE — H&P (Signed)
Cardiology Admission History and Physical:   Patient ID: Alec Johnson MRN: [id number]; DOB: Apr 08, 1950   Admission date: 12/27/2019  Primary Care Provider: Katherina Mires, MD [facility] Cardiologist: Kirk Ruths, MD    Chief Complaint:  Chest pain  Patient Profile:   Alec Johnson is a 69 y.o. male with past medical history of coronary artery disease, hypertension, hyperlipidemia, bradycardia with chest pain.  History of Present Illness:   Patient does have a history of coronary artery disease.  He was admitted in September 2019 with acute infarct.  Cardiac catheterization at that time revealed an occluded right coronary artery with left to right collaterals.  There was an occluded ostial ramus.  LV function was normal.  He had PCI of his ramus at that time.  Most recent echocardiogram in October 2019 showed vigorous LV function, grade 2 diastolic dysfunction and biatrial enlargement.  Patient typically does not have dyspnea on exertion, chest pain or syncope.  Since Wednesday of last week he has noticed increased dyspnea on exertion.  No orthopnea, PND or pedal edema.  Since Friday he has had chest pain.  It can be in the left or right side of his chest but has been continuous.  No radiation.  Not pleuritic, positional or related to food.  He denies associated nausea, diaphoresis or dyspnea.  He presented to the office today was referred to the emergency room and we were asked to evaluate.  No recent travel.   Past Medical History:  Diagnosis Date  . Colon polyp   . Diverticular disease   . Erectile dysfunction   . GERD (gastroesophageal reflux disease)    NO Recent problems  . Hyperlipidemia   . Osteoarthritis   . Seborrheic keratosis   . STEMI (ST elevation myocardial infarction) ([place])    10/19/17 PCI/DES to ramus with CTO of the mRCA with collaterals, normal EF    Past Surgical History:  Procedure Laterality Date  . athroscopic knee surgery     BIL KNEES  .  CORONARY/GRAFT ACUTE MI REVASCULARIZATION N/A 10/19/2017   Procedure: Coronary/Graft Acute MI Revascularization;  Surgeon: Jettie Booze, MD;  Location: [place] [facility];  Service: Cardiovascular;  Laterality: N/A;  . LEFT HEART CATH AND CORONARY ANGIOGRAPHY N/A 10/19/2017   Procedure: LEFT HEART CATH AND CORONARY ANGIOGRAPHY;  Surgeon: Jettie Booze, MD;  Location: [place] [facility];  Service: Cardiovascular;  Laterality: N/A;  . LEG SURGERY  1966   PINNING DUE TO INJURY  . NOSE SURGERY    . TOTAL KNEE ARTHROPLASTY Right 01/18/2014   Procedure: RIGHT TOTAL KNEE ARTHROPLASTY;  Surgeon: Tobi Bastos, MD;  Location: [facility];  Service: Orthopedics;  Laterality: Right;     Medications Prior to Admission: Prior to Admission medications   Medication Sig Start Date End Date Taking? Authorizing Provider  acetaminophen (TYLENOL) 500 MG tablet Take 1,000 mg by mouth every 6 (six) hours as needed for mild pain or moderate pain.    [provider]  aspirin EC 81 MG tablet Take 81 mg by mouth daily.    [provider]  atorvastatin (LIPITOR) 80 MG tablet TAKE 1 TABLET BY MOUTH  DAILY AT 6 PM 08/22/19   Lelon Perla, MD  Cholecalciferol (VITAMIN D3) 5000 units TABS Take 5,000 Units by mouth 3 (three) times a week.     [provider]  ezetimibe (ZETIA) 10 MG tablet TAKE 1 TABLET BY MOUTH  DAILY 11/17/19   Kirk Ruths  S, MD  fluticasone (FLONASE) 50 MCG/ACT nasal spray Place 2 sprays into both nostrils daily. 09/21/13 10/19/19  Jonathon Resides, MD  Glucosamine HCl (GLUCOSAMINE PO) Take 500 mg by mouth daily.    [provider]  lisinopril (ZESTRIL) 10 MG tablet TAKE 1 TABLET BY MOUTH  DAILY 11/17/19   Lelon Perla, MD  meloxicam (MOBIC) 15 MG tablet Take 15 mg by mouth daily. 03/10/19   [provider]  nitroGLYCERIN (NITROSTAT) 0.4 MG SL tablet Place 1 tablet (0.4 mg total) under the tongue every 5 (five) minutes x 3 doses as needed for  chest pain. 12/27/19   Buford Dresser, MD     Allergies:    Allergies  Allergen Reactions  . Doxycycline Hyclate Rash  . Penicillins Itching and Rash  . Rifampin Rash    Social History:   Social History   Socioeconomic History  . Marital status: Married    Spouse name: [name]  . Number of children: 2  . Years of education: 28  . Highest education level: Not on file  Occupational History  . Occupation: RETIRED     Employer: [employer]  Tobacco Use  . Smoking status: Former Smoker    Packs/day: 2.00    Years: 45.00    Pack years: 90.00    Types: Cigarettes    Quit date: 01/11/2012    Years since quitting: 7.9  . Smokeless tobacco: Never Used  . Tobacco comment: He has quit smoking.    Substance and Sexual Activity  . Alcohol use: Yes    Comment: Occasional  . Drug use: No  . Sexual activity: Yes  Other Topics Concern  . 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.     Education:  [employer]    Tobacco Use/Exposure: He has quit smoking.        Alcohol Use: Occasional    Drug Use:  None   Diet:  Regular   Exercise:  He was walking daily until he hurt his hip.     Hobbies:  Fishing, Environmental education officer, Golf    Social Determinants of Health   Financial Resource Strain:   . Difficulty of Paying Living Expenses: Not on file  Food Insecurity:   . Worried About Charity fundraiser in the Last Year: Not on file  . Ran Out of Food in the Last Year: Not on file  Transportation Needs:   . Lack of Transportation (Medical): Not on file  . Lack of Transportation (Non-Medical): Not on file  Physical Activity:   . Days of Exercise per Week: Not on file  . Minutes of Exercise per Session: Not on file  Stress:   . Feeling of Stress : Not on file  Social Connections:    . Frequency of Communication with Friends and Family: Not on file  . Frequency of Social Gatherings with Friends and Family: Not on file  . Attends Religious Services: Not on file  . Active Member of Clubs or Organizations: Not on file  . Attends Archivist Meetings: Not on file  . Marital Status: Not on file  Intimate Partner Violence:   . Fear of Current or Ex-Partner: Not on file  . Emotionally Abused: Not on file  . Physically Abused: Not on file  .  Sexually Abused: Not on file    Family History:   The patient's family history includes Cancer in his father and paternal grandfather; Coronary artery disease in his brother; Heart attack in his brother and paternal grandfather; Stroke in his mother.    ROS:  Please see the history of present illness.  Patient denies fevers, chills, productive cough, hemoptysis.  All other ROS reviewed and negative.     Physical Exam/Data:   Vitals:   12/27/19 1425 12/27/19 1613 12/27/19 1618 12/27/19 1630  BP: (!) 156/90  136/82 127/72  Pulse: 66  (!) 52 (!) 51  Resp: 18  18 18   Temp:      TempSrc:      SpO2: 97%  95% 93%  Weight:  102.1 kg    Height:  5\' 10"  (1.778 m)     No intake or output data in the 24 hours ending 12/27/19 1656 Last 3 Weights 12/27/2019 12/27/2019 10/19/2019  Weight (lbs) 225 lb 226 lb 233 lb 12.8 oz  Weight (kg) 102.059 kg 102.513 kg 106.051 kg     Body mass index is 32.28 kg/m.  General:  Well nourished, well developed, in no acute distress HEENT: normal Lymph: no adenopathy Neck: no JVD Endocrine:  No thryomegaly Vascular: No carotid bruits; FA pulses 2+ bilaterally without bruits  Cardiac:  normal S1, S2; RRR; no murmur  Lungs:  clear to auscultation bilaterally, no wheezing, rhonchi or rales  Abd: soft, nontender, no hepatomegaly  Ext: no edema Musculoskeletal:  No deformities, BUE and BLE strength normal and equal Skin: warm and dry  Neuro:  CNs 2-12 intact, no focal abnormalities noted Psych:   Normal affect    EKG:  The ECG was personally reviewed and demonstrates sinus bradycardia with no ST changes, LVH.  Laboratory Data:  High Sensitivity Troponin:   Recent Labs  Lab 12/27/19 1236  TROPONINIHS 17      Chemistry Recent Labs  Lab 12/27/19 1236  NA 139  K 4.8  CL 103  CO2 29  GLUCOSE 115*  BUN 25*  CREATININE 1.06  CALCIUM 9.4  GFRNONAA >60  ANIONGAP 7    Hematology Recent Labs  Lab 12/27/19 1236  WBC 9.9  RBC 4.91  HGB 15.8  HCT 47.8  MCV 97.4  MCH 32.2  MCHC 33.1  RDW 12.6  PLT 196    Radiology/Studies:  DG Chest 2 View  Result Date: 12/27/2019 CLINICAL DATA:  Chest pain. EXAM: CHEST - 2 VIEW COMPARISON:  CT 12/01/2019. FINDINGS: Mediastinum and hilar structures normal. Heart size normal. Low lung volumes. Lungs are clear of acute infiltrates. Tiny calcified nodular density projected over the left upper lung most likely tiny calcified granuloma. No focal infiltrate. No pleural effusion or pneumothorax. Degenerative change thoracic spine. IMPRESSION: No acute cardiopulmonary disease. Electronically Signed   By: Marcello Moores  Register   On: 12/27/2019 13:09     Assessment and Plan:   1. Chest pain-patient symptoms are atypical.  Electrocardiogram shows no ST changes.  We will cycle enzymes.  If negative will arrange outpatient stress nuclear study for risk stratification. 2. Dyspnea-patient describes dyspnea on exertion.  We will arrange echocardiogram to assess LV function.  Check D-dimer. 3. Coronary artery disease-continue aspirin and statin. 4. Hyperlipidemia-continue statin.  :[id number]} HEART Score (for undifferentiated chest pain):  HEAR Score: 5   Severity of Illness: The appropriate patient status for this patient is OBSERVATION. Observation status is judged to be reasonable and necessary in order to provide the required  intensity of service to ensure the patient's safety. The patient's presenting symptoms, physical exam findings, and initial  radiographic and laboratory data in the context of their medical condition is felt to place them at decreased risk for further clinical deterioration. Furthermore, it is anticipated that the patient will be medically stable for discharge from the hospital within 2 midnights of admission. The following factors support the patient status of observation.   " The patient's presenting symptoms include chest pain. " The physical exam findings include Bradycardia. " The initial radiographic and laboratory data are normal troponin.     For questions or updates, please contact [place] Please consult [email or web address] for contact info under     Signed, Kirk Ruths, MD  12/27/2019 4:56 PM

## 2019-12-27 NOTE — ED Provider Notes (Signed)
[facility]ENC[facility] DEPARTMENT Provider Note   CSN: [id number] Arrival date & time: 12/27/19  1204     History No chief complaint on file.   Alec Johnson is a 69 y.o. male hx of HL, here presenting with chest pain.  Patient initially had right-sided chest pain for several days.  Today it is on the left side.  He states that it is just a sense of pressure.  He went to cardiology office and EKG has new T wave inversion in lead III and patient is sent here for further evaluation.  Patient states that he had a stent in his heart previously  The history is provided by the patient.     Past Medical History:  Diagnosis Date  . Colon polyp   . Diverticular disease   . Erectile dysfunction   . GERD (gastroesophageal reflux disease)    NO Recent problems  . Hyperlipidemia   . Osteoarthritis   . Seborrheic keratosis   . STEMI (ST elevation myocardial infarction) ([place])    10/19/17 PCI/DES to ramus with CTO of the mRCA with collaterals, normal EF    Patient Active Problem List   Diagnosis Date Noted  . Dyspnea 11/26/2017  . CAD S/P RI PCI 10/29/2017  . Essential hypertension 10/29/2017  . History of ST elevation myocardial infarction (STEMI) 10/19/2017  . 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 Rash and nonspecific skin eruption 04/30/2013  . Cellulitis of finger of left hand 04/30/2013  . Routine general medical examination at a health care facility 01/22/2013  . BPH (benign prostatic hypertrophy) 11/28/2012  . Need for prophylactic vaccination and inoculation against influenza 11/28/2012  . Bradycardia 10/01/2011  . Tobacco abuse 10/01/2011  . Diverticular disease   . Neck pain   . Dyslipidemia, goal LDL below 70   . Allergic rhinitis   . Allergic sinusitis   . GERD (gastroesophageal reflux disease)   . Seborrheic keratosis   . Osteoarthritis   . Erectile dysfunction   . Colon polyp     Past Surgical History:  Procedure Laterality Date  .  athroscopic knee surgery     BIL KNEES  . CORONARY/GRAFT ACUTE MI REVASCULARIZATION N/A 10/19/2017   Procedure: Coronary/Graft Acute MI Revascularization;  Surgeon: Jettie Booze, MD;  Location: [facility];  Service: Cardiovascular;  Laterality: N/A;  . LEFT HEART CATH AND CORONARY ANGIOGRAPHY N/A 10/19/2017   Procedure: LEFT HEART CATH AND CORONARY ANGIOGRAPHY;  Surgeon: Jettie Booze, MD;  Location: [facility];  Service: Cardiovascular;  Laterality: N/A;  . LEG SURGERY  1966   PINNING DUE TO INJURY  . NOSE SURGERY    . TOTAL KNEE ARTHROPLASTY Right 01/18/2014   Procedure: RIGHT TOTAL KNEE ARTHROPLASTY;  Surgeon: Tobi Bastos, MD;  Location: [facility];  Service: Orthopedics;  Laterality: Right;       Family History  Problem Relation Age of Onset  . Coronary artery disease Brother        MI  . Heart attack Brother   . Stroke Mother   . Cancer Father        Lung Cancer  . Cancer Paternal Grandfather        Lung Cancer  . Heart attack Paternal Grandfather     Social History   Tobacco Use  . Smoking status: Former Smoker    Packs/day: 2.00    Years: 45.00    Pack years: 90.00    Types: Cigarettes  Quit date: 01/11/2012    Years since quitting: 7.9  . Smokeless tobacco: Never Used  . Tobacco comment: He has quit smoking.    Substance Use Topics  . Alcohol use: Yes    Comment: Occasional  . Drug use: No    Home Medications Prior to Admission medications   Medication Sig Start Date End Date Taking? Authorizing Provider  acetaminophen (TYLENOL) 500 MG tablet Take 1,000 mg by mouth every 6 (six) hours as needed for mild pain or moderate pain.    [provider]  aspirin EC 81 MG tablet Take 81 mg by mouth daily.    [provider]  atorvastatin (LIPITOR) 80 MG tablet TAKE 1 TABLET BY MOUTH  DAILY AT 6 PM 08/22/19   Lelon Perla, MD  Cholecalciferol (VITAMIN D3) 5000 units TABS Take 5,000 Units by mouth 3 (three) times a  week.     [provider]  ezetimibe (ZETIA) 10 MG tablet TAKE 1 TABLET BY MOUTH  DAILY 11/17/19   Lelon Perla, MD  fluticasone [facility]) 50 MCG/ACT nasal spray Place 2 sprays into both nostrils daily. 09/21/13 10/19/19  Jonathon Resides, MD  Glucosamine HCl (GLUCOSAMINE PO) Take 500 mg by mouth daily.    [provider]  lisinopril (ZESTRIL) 10 MG tablet TAKE 1 TABLET BY MOUTH  DAILY 11/17/19   Lelon Perla, MD  meloxicam (MOBIC) 15 MG tablet Take 15 mg by mouth daily. 03/10/19   [provider]  nitroGLYCERIN (NITROSTAT) 0.4 MG SL tablet Place 1 tablet (0.4 mg total) under the tongue every 5 (five) minutes x 3 doses as needed for chest pain. 12/27/19   Buford Dresser, MD    Allergies    Doxycycline hyclate, Penicillins, and Rifampin  Review of Systems   Review of Systems  Cardiovascular: Positive for chest pain.  All other systems reviewed and are negative.   Physical Exam Updated Vital Signs BP 127/72   Pulse (!) 51   Temp 98.6 F (37 C) (Oral)   Resp 18   Ht 5\' 10"  (1.778 m)   Wt 102.1 kg   SpO2 93%   BMI 32.28 kg/m   Physical Exam Vitals and nursing note reviewed.  Constitutional:      Appearance: Normal appearance.  HENT:     Head: Normocephalic.     Nose: Nose normal.     Mouth/Throat:     Mouth: Mucous membranes are moist.  Eyes:     Extraocular Movements: Extraocular movements intact.     Pupils: Pupils are equal, round, and reactive to light.  Cardiovascular:     Rate and Rhythm: Normal rate and regular rhythm.     Pulses: Normal pulses.     Heart sounds: Normal heart sounds.  Pulmonary:     Effort: Pulmonary effort is normal.     Breath sounds: Normal breath sounds.     Comments: No reproducible tenderness  Abdominal:     General: Abdomen is flat.     Palpations: Abdomen is soft.  Musculoskeletal:        General: Normal range of motion.     Cervical back: Normal range of motion and neck supple.  Skin:     General: Skin is warm.     Capillary Refill: Capillary refill takes less than 2 seconds.  Neurological:     General: No focal deficit present.     Mental Status: He is alert and oriented to person, place, and time.  Psychiatric:  Mood and Affect: Mood normal.        Behavior: Behavior normal.     ED Results / Procedures / Treatments   Labs (all labs ordered are listed, but only abnormal results are displayed) Labs Reviewed  BASIC METABOLIC PANEL - Abnormal; Notable for the following components:      Result Value   Glucose, Bld 115 (*)    BUN 25 (*)    All other components within normal limits  CBC  D-DIMER, QUANTITATIVE (NOT AT [facility])  TROPONIN I (HIGH SENSITIVITY)  TROPONIN I (HIGH SENSITIVITY)    EKG EKG Interpretation  Date/Time:  Tuesday December 27 2019 12:26:23 EST Ventricular Rate:  54 PR Interval:  138 QRS Duration: 90 QT Interval:  410 QTC Calculation: 388 R Axis:   -8 Text Interpretation: Sinus bradycardia Minimal voltage criteria for LVH, may be normal variant ( R in aVL ) Borderline ECG No significant change since last tracing Confirmed by Wandra Arthurs [phone]) on 12/27/2019 3:46:22 PM   Radiology DG Chest 2 View  Result Date: 12/27/2019 CLINICAL DATA:  Chest pain. EXAM: CHEST - 2 VIEW COMPARISON:  CT 12/01/2019. FINDINGS: Mediastinum and hilar structures normal. Heart size normal. Low lung volumes. Lungs are clear of acute infiltrates. Tiny calcified nodular density projected over the left upper lung most likely tiny calcified granuloma. No focal infiltrate. No pleural effusion or pneumothorax. Degenerative change thoracic spine. IMPRESSION: No acute cardiopulmonary disease. Electronically Signed   By: Marcello Moores  Register   On: 12/27/2019 13:09    Procedures Procedures (including critical care time)  Medications Ordered in ED Medications  nitroGLYCERIN (NITROSTAT) SL tablet 0.4 mg (has no administration in time range)  aspirin tablet 325 mg (325 mg Oral  Given 12/27/19 1631)    ED Course  I have reviewed the triage vital signs and the nursing notes.  Pertinent labs & imaging results that were available during my care of the patient were reviewed by me and considered in my medical decision making (see chart for details).    MDM Rules/Calculators/A&P HEAR Score: 5                       Alec Johnson is a 69 y.o. male here presenting with chest pain.  Left-sided chest pain for several days.  Patient has nonspecific EKG changes. Patient's initial troponin is normal.  Since patient has a history of CAD with stent, I consulted cardiology who will admit for observation.    Final Clinical Impression(s) / ED Diagnoses Final diagnoses:  Chest pain, unspecified type    Rx / DC Orders ED Discharge Orders    None       Drenda Freeze, MD 12/27/19 1714

## 2019-12-27 NOTE — ED Notes (Signed)
Dinner tray ordered.

## 2019-12-28 ENCOUNTER — Telehealth (HOSPITAL_COMMUNITY): Payer: Self-pay | Admitting: *Deleted

## 2019-12-28 ENCOUNTER — Observation Stay (HOSPITAL_BASED_OUTPATIENT_CLINIC_OR_DEPARTMENT_OTHER): Payer: Medicare Other

## 2019-12-28 DIAGNOSIS — R079 Chest pain, unspecified: Secondary | ICD-10-CM

## 2019-12-28 DIAGNOSIS — R072 Precordial pain: Secondary | ICD-10-CM | POA: Diagnosis not present

## 2019-12-28 LAB — LIPID PANEL
Cholesterol: 84 mg/dL (ref 0–200)
HDL: 33 mg/dL — ABNORMAL LOW (ref >40)
LDL Cholesterol: 42 mg/dL (ref 0–99)
Total CHOL/HDL Ratio: 2.5 RATIO
Triglycerides: 43 mg/dL (ref <150)
VLDL: 9 mg/dL (ref 0–40)

## 2019-12-28 LAB — BASIC METABOLIC PANEL
Anion gap: 9 (ref 5–15)
BUN: 24 mg/dL — ABNORMAL HIGH (ref 8–23)
CO2: 27 mmol/L (ref 22–32)
Calcium: 9.4 mg/dL (ref 8.9–10.3)
Chloride: 102 mmol/L (ref 98–111)
Creatinine, Ser: 1.06 mg/dL (ref 0.61–1.24)
GFR, Estimated: >60 mL/min (ref >60)
Glucose, Bld: 99 mg/dL (ref 70–99)
Potassium: 4.9 mmol/L (ref 3.5–5.1)
Sodium: 138 mmol/L (ref 135–145)

## 2019-12-28 LAB — TROPONIN I (HIGH SENSITIVITY): Troponin I (High Sensitivity): 21 ng/L — ABNORMAL HIGH (ref <18)

## 2019-12-28 LAB — ECHOCARDIOGRAM COMPLETE
Area-P 1/2: 1.87 cm2
Height: 70 in
S' Lateral: 3.56 cm
Weight: 3600 oz

## 2019-12-28 NOTE — ED Notes (Signed)
Patient noted to have HR of 39 on monitor, this RN went to bedside, patient awake and asymptomatic, palpated HR 49.

## 2019-12-28 NOTE — Discharge Summary (Signed)
Discharge Summary    Patient ID: Alec Johnson MRN: [id number]; DOB: 02-07-1950  Admit date: 12/27/2019 Discharge date: 12/28/2019  Primary Care Provider: Katherina Mires, MD  Primary Cardiologist: Kirk Ruths, MD  Primary Electrophysiologist:  None   Discharge Diagnoses    Principal Problem:   Chest pain of uncertain etiology Active Problems:   Dyslipidemia, goal LDL below 70   GERD (gastroesophageal reflux disease)   Tobacco abuse   CAD S/P RI PCI   Essential hypertension   Dyspnea    Diagnostic Studies/Procedures    Echocardiogram 12/28/19: 1. Left ventricular ejection fraction, by estimation, is 60 to 65%. The  left ventricle has normal function. The left ventricle has no regional  wall motion abnormalities. There is moderate left ventricular hypertrophy.  Left ventricular diastolic  parameters are indeterminate.  2. Right ventricular systolic function is normal. The right ventricular  size is normal. Tricuspid regurgitation signal is inadequate for assessing  PA pressure.  3. Left atrial size was mildly dilated.  4. The mitral valve is normal in structure. No evidence of mitral valve  regurgitation. No evidence of mitral stenosis.  5. The aortic valve is tricuspid. Aortic valve regurgitation is not  visualized. Mild aortic valve sclerosis is present, with no evidence of  aortic valve stenosis.  6. Aortic dilatation noted. There is mild dilatation of the ascending  aorta, measuring 36 mm.  _____________   History of Present Illness     Alec Johnson is a 69 y.o. male with a PMH of CAD s/p PCI to RI in 2019 with occluded RCA which was medically managed, HTN, HLD, bradycardia, and tobacco abuse. He was admitted in September 2019 with an acute infarct. Cardiac catheterization at that time revealed an occluded right coronary artery with left to right collaterals.  There was an occluded ostial ramus.  LV function was normal.  He had PCI of his ramus at that  time.  Most recent echocardiogram in October 2019 showed vigorous LV function, grade 2 diastolic dysfunction and biatrial enlargement.  Patient typically does not have dyspnea on exertion, chest pain or syncope.  Since Wednesday of last week he has noticed increased dyspnea on exertion.  No orthopnea, PND or pedal edema.  Since Friday he has had chest pain.  It can be in the left or right side of his chest but has been continuous.  No radiation.  Not pleuritic, positional or related to food.  He denies associated nausea, diaphoresis or dyspnea.  He presented to the office today was referred to the emergency room and we were asked to evaluate.  No recent travel.  Hospital Course     Consultants: None   1. Chest pain in patient with known CAD s/p RI 09/2017: patient presented with chest pain which was constant in nature with associated DOE, otherwise no cardiac complaints. Hstrop trend 17>19>21; not consistent with ACS. EKG non-ischemic. Echocardiogram this admission showed EF 60-65%, moderate LVH, indeterminate LV diastolic function, no RWMA, mild LAE, and no significant valvular abnormalities. Not on BBlocker given baseline bradycardia. Given atypical symptoms and reassuring work-up, patient deemed stable for discharge home with an outpatient stress test.  - Will order an outpatient NST to further evaluate symptoms.The risks [chest pain, shortness of breath, cardiac arrhythmias, dizziness, blood pressure fluctuations, myocardial infarction, stroke/transient ischemic attack, nausea, vomiting, allergic reaction, radiation exposure, metallic taste sensation and life-threatening complications (estimated to be 1 in 10,000)], benefits (risk stratification, diagnosing coronary artery disease, treatment guidance) and alternatives of  a nuclear stress test were discussed in detail with Mr. Degregory and he agrees to proceed. - Continue aspirin and statin - SL nitro Rx is up to date - Recommended trial of antacid to see if  that helps.   2. Dyspnea: Ddimer negative. Echo with EF 60-65% with indeterminate LV diastolic function though unable to assess PA pressures.  - Will follow-up outpatient stress test to r/o ischemia  3. HTN: BP stable - Continue lisinopril  4. HLD: LDL well controlled at 42 this admission. - Continue statin and zetia   Did the patient have an acute coronary syndrome (MI, NSTEMI, STEMI, etc) this admission?:  No                               Did the patient have a percutaneous coronary intervention (stent / angioplasty)?:  No.       _____________  Discharge Vitals Blood pressure 135/71, pulse (!) 58, temperature 98.7 F (37.1 C), temperature source Oral, resp. rate 15, height 5\' 10"  (1.778 m), weight 102.1 kg, SpO2 96 %.  Filed Weights   12/27/19 1613  Weight: 102.1 kg    Labs & Radiologic Studies    CBC Recent Labs    12/27/19 1236 12/27/19 2011  WBC 9.9 8.9  HGB 15.8 14.7  HCT 47.8 46.2  MCV 97.4 98.9  PLT 196 496   Basic Metabolic Panel Recent Labs    12/27/19 1236 12/27/19 1236 12/27/19 2011 12/28/19 0300  NA 139  --   --  138  K 4.8  --   --  4.9  CL 103  --   --  102  CO2 29  --   --  27  GLUCOSE 115*  --   --  99  BUN 25*  --   --  24*  CREATININE 1.06   < > 1.03 1.06  CALCIUM 9.4  --   --  9.4   < > = values in this interval not displayed.   Liver Function Tests No results for input(s): AST, ALT, ALKPHOS, BILITOT, PROT, ALBUMIN in the last 72 hours. No results for input(s): LIPASE, AMYLASE in the last 72 hours. High Sensitivity Troponin:   Recent Labs  Lab 12/27/19 1236 12/27/19 1600 12/27/19 2011 12/28/19 0300  TROPONINIHS 17 17 19* 21*    BNP Invalid input(s): POCBNP D-Dimer Recent Labs    12/27/19 1251  DDIMER 0.48   Hemoglobin A1C No results for input(s): HGBA1C in the last 72 hours. Fasting Lipid Panel Recent Labs    12/28/19 0300  CHOL 84  HDL 33*  LDLCALC 42  TRIG 43  CHOLHDL 2.5   Thyroid Function Tests No results  for input(s): TSH, T4TOTAL, T3FREE, THYROIDAB in the last 72 hours.  Invalid input(s): FREET3 _____________  DG Chest 2 View  Result Date: 12/27/2019 CLINICAL DATA:  Chest pain. EXAM: CHEST - 2 VIEW COMPARISON:  CT 12/01/2019. FINDINGS: Mediastinum and hilar structures normal. Heart size normal. Low lung volumes. Lungs are clear of acute infiltrates. Tiny calcified nodular density projected over the left upper lung most likely tiny calcified granuloma. No focal infiltrate. No pleural effusion or pneumothorax. Degenerative change thoracic spine. IMPRESSION: No acute cardiopulmonary disease. Electronically Signed   By: Marcello Moores  Register   On: 12/27/2019 13:09   ECHOCARDIOGRAM COMPLETE  Result Date: 12/28/2019    ECHOCARDIOGRAM REPORT   Patient Name:   Alec Johnson [facility] Date of Exam: 12/28/2019 Medical  Rec #:  [id number]      Height:       70.0 in Accession #:    [id number]     Weight:       225.0 lb Date of Birth:  [birth year]      BSA:          2.194 m Patient Age:    20 years       BP:           141/73 mmHg Patient Gender: M              HR:           49 bpm. Exam Location:  Inpatient Procedure: 2D Echo, Cardiac Doppler and Color Doppler Indications:    R07.9* Chest pain, unspecified  History:        Patient has prior history of Echocardiogram examinations, most                 recent 10/20/2017. Previous Myocardial Infarction; Risk                 Factors:Dyslipidemia and GERD.  Sonographer:    Tiffany Dance Referring Phys: [phone] LINDSAY B ROBERTS  Sonographer Comments: No subcostal window. IMPRESSIONS  1. Left ventricular ejection fraction, by estimation, is 60 to 65%. The left ventricle has normal function. The left ventricle has no regional wall motion abnormalities. There is moderate left ventricular hypertrophy. Left ventricular diastolic parameters are indeterminate.  2. Right ventricular systolic function is normal. The right ventricular size is normal. Tricuspid regurgitation signal is inadequate for  assessing PA pressure.  3. Left atrial size was mildly dilated.  4. The mitral valve is normal in structure. No evidence of mitral valve regurgitation. No evidence of mitral stenosis.  5. The aortic valve is tricuspid. Aortic valve regurgitation is not visualized. Mild aortic valve sclerosis is present, with no evidence of aortic valve stenosis.  6. Aortic dilatation noted. There is mild dilatation of the ascending aorta, measuring 36 mm. FINDINGS  Left Ventricle: Left ventricular ejection fraction, by estimation, is 60 to 65%. The left ventricle has normal function. The left ventricle has no regional wall motion abnormalities. The left ventricular internal cavity size was normal in size. There is  moderate left ventricular hypertrophy. Left ventricular diastolic parameters are indeterminate. Right Ventricle: The right ventricular size is normal. Right vetricular wall thickness was not assessed. Right ventricular systolic function is normal. Tricuspid regurgitation signal is inadequate for assessing PA pressure. Left Atrium: Left atrial size was mildly dilated. Right Atrium: Right atrial size was normal in size. Pericardium: There is no evidence of pericardial effusion. Mitral Valve: The mitral valve is normal in structure. No evidence of mitral valve regurgitation. No evidence of mitral valve stenosis. Tricuspid Valve: The tricuspid valve is normal in structure. Tricuspid valve regurgitation is trivial. Aortic Valve: The aortic valve is tricuspid. Aortic valve regurgitation is not visualized. Mild aortic valve sclerosis is present, with no evidence of aortic valve stenosis. Pulmonic Valve: The pulmonic valve was not well visualized. Pulmonic valve regurgitation is not visualized. Aorta: The aortic root is normal in size and structure and aortic dilatation noted. There is mild dilatation of the ascending aorta, measuring 36 mm. IAS/Shunts: The interatrial septum was not well visualized.  LEFT VENTRICLE PLAX 2D  LVIDd:         4.57 cm  Diastology LVIDs:         3.56 cm  LV e' medial:    3.92 cm/s  LV PW:         1.40 cm  LV E/e' medial:  13.3 LV IVS:        1.16 cm  LV e' lateral:   5.77 cm/s LVOT diam:     2.10 cm  LV E/e' lateral: 9.1 LV SV:         78 LV SV Index:   35 LVOT Area:     3.46 cm  RIGHT VENTRICLE RV Basal diam:  2.80 cm RV S prime:     20.20 cm/s TAPSE (M-mode): 2.2 cm LEFT ATRIUM             Index       RIGHT ATRIUM           Index LA diam:        4.70 cm 2.14 cm/m  RA Area:     17.60 cm LA Vol (A2C):   93.6 ml 42.65 ml/m RA Volume:   47.30 ml  21.55 ml/m LA Vol (A4C):   62.3 ml 28.39 ml/m LA Biplane Vol: 83.3 ml 37.96 ml/m  AORTIC VALVE LVOT Vmax:   116.00 cm/s LVOT Vmean:  70.300 cm/s LVOT VTI:    0.224 m  AORTA Ao Root diam: 3.90 cm Ao Asc diam:  3.60 cm MITRAL VALVE MV Area (PHT): 1.87 cm    SHUNTS MV Decel Time: 406 msec    Systemic VTI:  0.22 m MV E velocity: 52.30 cm/s  Systemic Diam: 2.10 cm MV A velocity: 74.60 cm/s MV E/A ratio:  0.70 Oswaldo Milian MD Electronically signed by Oswaldo Milian MD Signature Date/Time: 12/28/2019/10:29:08 AM    Final    CT CHEST LUNG CA SCREEN LOW DOSE W/O CM  Result Date: 12/02/2019 CLINICAL DATA:  69 year old male former smoker (quit 8 years ago) with 92 pack-year history of smoking. Lung cancer screening examination. EXAM: CT CHEST WITHOUT CONTRAST LOW-DOSE FOR LUNG CANCER SCREENING TECHNIQUE: Multidetector CT imaging of the chest was performed following the standard protocol without IV contrast. COMPARISON:  Chest CT 11/29/2018. FINDINGS: Cardiovascular: Heart size is normal. There is no significant pericardial fluid, thickening or pericardial calcification. There is aortic atherosclerosis, as well as atherosclerosis of the great vessels of the mediastinum and the coronary arteries, including calcified atherosclerotic plaque in the left anterior descending and right coronary arteries. Mediastinum/Nodes: No pathologically enlarged mediastinal  or hilar lymph nodes. Please note that accurate exclusion of hilar adenopathy is limited on noncontrast CT scans. Esophagus is unremarkable in appearance. No axillary lymphadenopathy. Lungs/Pleura: Multiple tiny pulmonary nodules are noted throughout the lungs bilaterally, largest of which is in the right middle lobe (axial image 169 of series 3), with a volume derived mean diameter of 4.6 mm. No other larger more suspicious appearing pulmonary nodules or masses are noted. No acute consolidative airspace disease. No pleural effusions. Diffuse bronchial wall thickening with mild centrilobular and paraseptal emphysema. Upper Abdomen: Unremarkable. Musculoskeletal: There are no aggressive appearing lytic or blastic lesions noted in the visualized portions of the skeleton. IMPRESSION: 1. Lung-RADS 2S, benign appearance or behavior. Continue annual screening with low-dose chest CT without contrast in 12 months. 2. The "S" modifier above refers to potentially clinically significant non lung cancer related findings. Specifically, there is aortic atherosclerosis, in addition to two vessel coronary artery disease. Please note that although the presence of coronary artery calcium documents the presence of coronary artery disease, the severity of this disease and any potential stenosis cannot be assessed on this non-gated CT examination. Assessment for  potential risk factor modification, dietary therapy or pharmacologic therapy may be warranted, if clinically indicated. 3. Mild diffuse bronchial wall thickening with mild centrilobular and paraseptal emphysema; imaging findings suggestive of underlying COPD. Aortic Atherosclerosis (ICD10-I70.0) and Emphysema (ICD10-J43.9). Electronically Signed   By: Vinnie Langton M.D.   On: 12/02/2019 09:36   Disposition   Pt is being discharged home today in good condition.  Follow-up Plans & Appointments     Follow-up Information    [facility] Follow up on  01/04/2020.   Specialty: Cardiology Why: Please arrive 15 minutes early for your 10am STRESS TEST appointment Contact information: [address] [place] [phone]             Discharge Instructions    Cardiac Stress Test: Informed Consent Details: Physician/Practitioner Attestation; Transcribe to consent form and obtain patient signature   Complete by: As directed    Physician/Practitioner attestation of informed consent for procedure/surgical case: I, the physician/practitioner, attest that I have discussed with the patient the benefits, risks, side effects, alternatives, likelihood of achieving goals and potential problems during recovery for the procedure that I have provided informed consent.   Procedure: nuclear stress test   Indication/Reason: chest pain      Discharge Medications   Allergies as of 12/28/2019      Reactions   Doxycycline Hyclate Rash   Penicillins Itching, Rash   Rifampin Rash      Medication List    TAKE these medications   acetaminophen 500 MG tablet Commonly known as: TYLENOL Take 1,000 mg by mouth every 6 (six) hours as needed for mild pain or moderate pain.   aspirin EC 81 MG tablet Take 81 mg by mouth daily.   atorvastatin 80 MG tablet Commonly known as: LIPITOR TAKE 1 TABLET BY MOUTH  DAILY AT 6 PM What changed: See the new instructions.   ezetimibe 10 MG tablet Commonly known as: ZETIA TAKE 1 TABLET BY MOUTH  DAILY   fluticasone 50 MCG/ACT nasal spray Commonly known as: FLONASE Place 2 sprays into both nostrils daily.   GLUCOSAMINE PO Take 500 mg by mouth daily.   lisinopril 10 MG tablet Commonly known as: ZESTRIL TAKE 1 TABLET BY MOUTH  DAILY What changed:   how much to take  how to take this  when to take this  additional instructions   meloxicam 15 MG tablet Commonly known as: MOBIC Take 15 mg by mouth daily.   nitroGLYCERIN 0.4 MG SL tablet Commonly known as:  NITROSTAT Place 1 tablet (0.4 mg total) under the tongue every 5 (five) minutes x 3 doses as needed for chest pain.   Vitamin D3 125 MCG (5000 UT) Tabs Take 5,000 Units by mouth 3 (three) times a week.          Outstanding Labs/Studies   NST 01/04/20  Duration of Discharge Encounter   Greater than 30 minutes including physician time.  Signed, Abigail Butts, PA-C 12/28/2019, 11:57 AM

## 2019-12-28 NOTE — ED Notes (Signed)
Pt refused to have 0700 trop drawn

## 2019-12-28 NOTE — Discharge Instructions (Signed)
Your heart work-up this hospital stay has been quite reassuring. We will plan for an outpatient stress test - details listed below.  Please try an over-the-counter antacid to see if there is a reflux component to your chest pain. Try famotidine (pepcid) 1 to 2 times per day.       You have a Stress Test scheduled at [place]. Your doctor has ordered this test to check the blood flow in your heart arteries.  Please arrive 15 minutes early for paperwork. The whole test will take several hours. You may want to bring reading material to remain occupied while undergoing different parts of the test.  Instructions:  No food/drink after midnight the night before.  It is OK to take your morning meds with a sip of water EXCEPT for those types of medicines listed below or otherwise instructed.  No caffeine/decaf products 24 hours before, including medicines such as Excedrin or Goody Powders. Call if there are any questions.   Wear comfortable clothes and shoes.    What To Expect: When you arrive in the lab, the technician will inject a small amount of radioactive tracer into your arm through an IV while you are resting quietly. This helps [place] to form pictures of your heart. You will likely only feel a sting from the IV. After a waiting period, resting pictures will be obtained under a big camera. These are the "before" pictures.  Next, you will be prepped for the stress portion of the test. This may include either walking on a treadmill or receiving a medicine that helps to dilate blood vessels in your heart to simulate the effect of exercise on your heart. If you are walking on a treadmill, you will walk at different paces to try to get your heart rate to a goal number that is based on your age. If your doctor has chosen the pharmacologic test, then you will receive a medicine through your IV that may cause temporary nausea, flushing, shortness of breath and sometimes chest  discomfort or vomiting. This is typically short-lived and usually resolves quickly. If you experience symptoms, that does not automatically mean the test is abnormal. Some patients do not experience any symptoms at all. Your blood pressure and heart rate will be monitored, and we will be watching your EKG on a computer screen for any changes. During this portion of the test, the radiologist will inject another small amount of radioactive tracer into your IV. After a waiting period, you will undergo a second set of pictures. These are the "after" pictures.  The doctor reading the test will compare the before-and-after images to look for evidence of heart blockages or heart weakness. The test usually takes 1 day to complete, but in certain instances (for example, if a patient is over a certain weight limit), the test may be done over the span of 2 days.

## 2019-12-28 NOTE — Telephone Encounter (Signed)
Patient given detailed instructions per Myocardial Perfusion Study Information Sheet for the test on 01/04/2020 at 1000. Patient notified to arrive 15 minutes early and that it is imperative to arrive on time for appointment to keep from having the test rescheduled.  If you need to cancel or reschedule your appointment, please call the office within 24 hours of your appointment. . Patient verbalized understanding.Jillisa Harris, Ranae Palms  No mychart available.

## 2019-12-28 NOTE — Progress Notes (Signed)
  Echocardiogram 2D Echocardiogram has been performed.  Alec Johnson 12/28/2019, 9:05 AM

## 2019-12-28 NOTE — ED Notes (Signed)
Breakfast ordered 

## 2019-12-28 NOTE — Progress Notes (Signed)
Progress Note  Patient Name: Alec Johnson Date of Encounter: 12/28/2019  [facility] Cardiologist: Kirk Ruths, MD   Subjective   No dyspnea; mild pain in left breast area where echocardiogram was performed but no other chest pain.  Inpatient Medications    Scheduled Meds: . aspirin  324 mg Oral NOW   Or  . aspirin  300 mg Rectal NOW  . aspirin EC  81 mg Oral Daily  . atorvastatin  80 mg Oral Daily  . ezetimibe  10 mg Oral Daily  . heparin  5,000 Units Subcutaneous Q8H  . lisinopril  10 mg Oral Daily   Continuous Infusions:  PRN Meds: acetaminophen, nitroGLYCERIN, ondansetron (ZOFRAN) IV   Vital Signs    Vitals:   12/28/19 0320 12/28/19 0400 12/28/19 0500 12/28/19 0600  BP: (!) 100/53 115/72 128/71 140/73  Pulse: (!) 42 (!) 41 (!) 43 (!) 44  Resp: 19 17 16 15   Temp:    98.7 F (37.1 C)  TempSrc:    Oral  SpO2: 92% 93% 93% 95%  Weight:      Height:       No intake or output data in the 24 hours ending 12/28/19 0901 Last 3 Weights 12/27/2019 12/27/2019 10/19/2019  Weight (lbs) 225 lb 226 lb 233 lb 12.8 oz  Weight (kg) 102.059 kg 102.513 kg 106.051 kg      Telemetry    Sinus bradycardia with occasional junctional beat and PVC.- Personally Reviewed  Physical Exam   GEN: No acute distress.   Neck: No JVD Cardiac: RRR, no murmurs, rubs, or gallops.  Respiratory: Clear to auscultation bilaterally. GI: Soft, nontender, non-distended  MS: No edema Neuro:  Nonfocal  Psych: Normal affect   Labs    High Sensitivity Troponin:   Recent Labs  Lab 12/27/19 1236 12/27/19 1600 12/27/19 2011 12/28/19 0300  TROPONINIHS 17 17 19* 21*      Chemistry Recent Labs  Lab 12/27/19 1236 12/27/19 2011 12/28/19 0300  NA 139  --  138  K 4.8  --  4.9  CL 103  --  102  CO2 29  --  27  GLUCOSE 115*  --  99  BUN 25*  --  24*  CREATININE 1.06 1.03 1.06  CALCIUM 9.4  --  9.4  GFRNONAA >60 >60 >60  ANIONGAP 7  --  9     Hematology Recent Labs  Lab  12/27/19 1236 12/27/19 2011  WBC 9.9 8.9  RBC 4.91 4.67  HGB 15.8 14.7  HCT 47.8 46.2  MCV 97.4 98.9  MCH 32.2 31.5  MCHC 33.1 31.8  RDW 12.6 12.6  PLT 196 171    DDimer  Recent Labs  Lab 12/27/19 1251  DDIMER 0.48     Radiology    DG Chest 2 View  Result Date: 12/27/2019 CLINICAL DATA:  Chest pain. EXAM: CHEST - 2 VIEW COMPARISON:  CT 12/01/2019. FINDINGS: Mediastinum and hilar structures normal. Heart size normal. Low lung volumes. Lungs are clear of acute infiltrates. Tiny calcified nodular density projected over the left upper lung most likely tiny calcified granuloma. No focal infiltrate. No pleural effusion or pneumothorax. Degenerative change thoracic spine. IMPRESSION: No acute cardiopulmonary disease. Electronically Signed   By: Marcello Moores  Register   On: 12/27/2019 13:09    Patient Profile     69 y.o. male with past medical history of coronary artery disease, hypertension, hyperlipidemia, bradycardia admitted with chest pain.  Assessment & Plan    1. Chest  pain-patient symptoms have improved.  Initial symptoms were atypical and electrocardiogram showed no ST changes.  Troponins minimally elevated but not consistent with acute coronary syndrome as there is no clear trend. Await results of echocardiogram. If LV function normal plan to discharge and proceed with outpatient nuclear study for risk stratification.   2. Dyspnea-D-dimer negative.  Await echocardiogram for LV function. 3. Coronary artery disease-continue aspirin and statin. 4. Hyperlipidemia-continue statin.  If LV function normal plan discharge today with outpatient nuclear study.  Follow-up with me 4 to 6 weeks.  > 30 min PA and physician time D2  For questions or updates, please contact [place] Please consult [email or web address] for contact info under        Signed, Kirk Ruths, MD  12/28/2019, 9:01 AM

## 2020-01-04 ENCOUNTER — Ambulatory Visit (HOSPITAL_COMMUNITY): Payer: Medicare Other | Attending: Cardiovascular Disease

## 2020-01-04 ENCOUNTER — Other Ambulatory Visit: Payer: Self-pay

## 2020-01-04 DIAGNOSIS — I252 Old myocardial infarction: Secondary | ICD-10-CM

## 2020-01-04 DIAGNOSIS — R079 Chest pain, unspecified: Secondary | ICD-10-CM

## 2020-01-04 DIAGNOSIS — I2510 Atherosclerotic heart disease of native coronary artery without angina pectoris: Secondary | ICD-10-CM | POA: Diagnosis not present

## 2020-01-04 DIAGNOSIS — Z9861 Coronary angioplasty status: Secondary | ICD-10-CM | POA: Insufficient documentation

## 2020-01-04 LAB — MYOCARDIAL PERFUSION IMAGING
LV dias vol: 131 mL (ref 62–150)
LV sys vol: 66 mL
Peak HR: 60 {beats}/min
Rest HR: 42 {beats}/min
SDS: 3
SRS: 1
SSS: 4
TID: 1.18

## 2020-01-04 MED ORDER — TECHNETIUM TC 99M TETROFOSMIN IV KIT
32.1000 | PACK | Freq: Once | INTRAVENOUS | Status: AC | PRN
  Administered 2020-01-04: 32.1 via INTRAVENOUS
  Filled 2020-01-04: qty 33

## 2020-01-04 MED ORDER — REGADENOSON 0.4 MG/5ML IV SOLN
0.4000 mg | Freq: Once | INTRAVENOUS | Status: AC
  Administered 2020-01-04: 0.4 mg via INTRAVENOUS

## 2020-01-04 MED ORDER — TECHNETIUM TC 99M TETROFOSMIN IV KIT
10.3000 | PACK | Freq: Once | INTRAVENOUS | Status: AC | PRN
  Administered 2020-01-04: 10.3 via INTRAVENOUS
  Filled 2020-01-04: qty 11

## 2020-01-26 ENCOUNTER — Encounter: Payer: Self-pay | Admitting: Cardiology

## 2020-01-26 ENCOUNTER — Ambulatory Visit: Payer: Medicare Other | Admitting: Cardiology

## 2020-01-26 ENCOUNTER — Other Ambulatory Visit: Payer: Self-pay

## 2020-01-26 VITALS — BP 124/77 | HR 91 | Temp 97.7°F | Ht 70.0 in | Wt 234.8 lb

## 2020-01-26 DIAGNOSIS — E785 Hyperlipidemia, unspecified: Secondary | ICD-10-CM | POA: Diagnosis not present

## 2020-01-26 DIAGNOSIS — I2510 Atherosclerotic heart disease of native coronary artery without angina pectoris: Secondary | ICD-10-CM | POA: Diagnosis not present

## 2020-01-26 DIAGNOSIS — R001 Bradycardia, unspecified: Secondary | ICD-10-CM

## 2020-01-26 DIAGNOSIS — R079 Chest pain, unspecified: Secondary | ICD-10-CM | POA: Diagnosis not present

## 2020-01-26 DIAGNOSIS — Z9861 Coronary angioplasty status: Secondary | ICD-10-CM

## 2020-01-26 NOTE — Assessment & Plan Note (Signed)
LDL 42 Dec 2021

## 2020-01-26 NOTE — Assessment & Plan Note (Signed)
Asymptomatic- Not on BB

## 2020-01-26 NOTE — Assessment & Plan Note (Signed)
RI PCI in the setting of STEMI 10/19/17. Residual occluded mRCA with collaterals 

## 2020-01-26 NOTE — Assessment & Plan Note (Signed)
Admitted 12/07-12/08/2019 Low risk Myoview as an OP 01/04/2020

## 2020-01-26 NOTE — Progress Notes (Deleted)
Medication Instructions:  *** *If you need a refill on your cardiac medications before your next appointment, please call your pharmacy*   Lab Work: *** If you have labs (blood work) drawn today and your tests are completely normal, you will receive your results only by: . MyChart Message (if you have MyChart) OR . A paper copy in the mail If you have any lab test that is abnormal or we need to change your treatment, we will call you to review the results.   Testing/Procedures: ***   Follow-Up: At [facility], you and your health needs are our priority.  As part of our continuing mission to provide you with exceptional heart care, we have created designated Provider Care Teams.  These Care Teams include your primary Cardiologist (physician) and Advanced Practice Providers (APPs -  Physician Assistants and Nurse Practitioners) who all work together to provide you with the care you need, when you need it.  We recommend signing up for the patient portal called "MyChart".  Sign up information is provided on this After Visit Summary.  MyChart is used to connect with patients for Virtual Visits (Telemedicine).  Patients are able to view lab/test results, encounter notes, upcoming appointments, etc.  Non-urgent messages can be sent to your provider as well.   To learn more about what you can do with MyChart, go to [email or web address].com.    Your next appointment:   {numbers 1-12:10294} {Time; day/wk/mo/yr(s):9076}  The format for your next appointment:   {F/U Visit Format          :2103603503}  Provider:   {Providers/Teams        :[id number]}   Other Instructions *** 

## 2020-01-26 NOTE — Progress Notes (Signed)
Cardiology Office Note:    Date:  01/26/2020   ID:  ZEBULEN GALLEN, DOB 1950-05-06, MRN [id number]  PCP:  Katherina Mires, MD  Cardiologist:  Kirk Ruths, MD  Electrophysiologist:  None   Referring MD: Katherina Mires, MD   No chief complaint on file.   History of Present Illness:    Alec Johnson is a 70 y.o. male with a hx of CAD, status post STEMI March 2019.  At cath then he had a CTO of his mid RCA with left-to-right collaterals from the circumflex and LAD.  The culprit vessel was a occluded ramus intermedius vessel.  This was treated with PCI and DES.  Other medical issues include chronic bradycardia which is asymptomatic and treated dyslipidemia.  The patient was admitted 12/27/2019 with chest pain.  His troponins were slightly elevated but it was a flat trend.  He had no EKG changes.  Echocardiogram showed an EF of 60 to 65% with moderate LVH but no significant wall motion abnormalities.  The patient was discharged and had an outpatient nuclear stress test 01/04/2020.  It did show apical lateral scarring but no ischemia.  He is in the office today for follow-up.  Since discharge he has done well.  He tells me after he left the hospital he tried some Pepcid AC for couple days and his chest pain went away.  He walks a couple miles 3-4 times a week without chest pain or SOB.   Past Medical History:  Diagnosis Date  . Colon polyp   . Diverticular disease   . Erectile dysfunction   . GERD (gastroesophageal reflux disease)    NO Recent problems  . Hyperlipidemia   . Osteoarthritis   . Seborrheic keratosis   . STEMI (ST elevation myocardial infarction) ([place])    10/19/17 PCI/DES to ramus with CTO of the mRCA with collaterals, normal EF    Past Surgical History:  Procedure Laterality Date  . athroscopic knee surgery     BIL KNEES  . CORONARY/GRAFT ACUTE MI REVASCULARIZATION N/A 10/19/2017   Procedure: Coronary/Graft Acute MI Revascularization;  Surgeon: Jettie Booze, MD;   Location: [facility];  Service: Cardiovascular;  Laterality: N/A;  . LEFT HEART CATH AND CORONARY ANGIOGRAPHY N/A 10/19/2017   Procedure: LEFT HEART CATH AND CORONARY ANGIOGRAPHY;  Surgeon: Jettie Booze, MD;  Location: [place] [place];  Service: Cardiovascular;  Laterality: N/A;  . LEG SURGERY  1966   PINNING DUE TO INJURY  . NOSE SURGERY    . TOTAL KNEE ARTHROPLASTY Right 01/18/2014   Procedure: RIGHT TOTAL KNEE ARTHROPLASTY;  Surgeon: Tobi Bastos, MD;  Location: [facility];  Service: Orthopedics;  Laterality: Right;    Current Medications: Current Meds  Medication Sig  . acetaminophen (TYLENOL) 500 MG tablet Take 1,000 mg by mouth every 6 (six) hours as needed for mild pain or moderate pain.  Marland Kitchen aspirin EC 81 MG tablet Take 81 mg by mouth daily.  Marland Kitchen atorvastatin (LIPITOR) 80 MG tablet TAKE 1 TABLET BY MOUTH  DAILY AT 6 PM (Patient taking differently: Take 80 mg by mouth daily.)  . Cholecalciferol (VITAMIN D3) 5000 units TABS Take 5,000 Units by mouth 3 (three) times a week.   . ezetimibe (ZETIA) 10 MG tablet TAKE 1 TABLET BY MOUTH  DAILY (Patient taking differently: Take 10 mg by mouth daily.)  . Glucosamine HCl (GLUCOSAMINE PO) Take 500 mg by mouth daily.  Marland Kitchen lisinopril (ZESTRIL) 10 MG tablet TAKE 1  TABLET BY MOUTH  DAILY (Patient taking differently: Take 10 mg by mouth daily.)  . meloxicam (MOBIC) 15 MG tablet Take 15 mg by mouth daily.  . nitroGLYCERIN (NITROSTAT) 0.4 MG SL tablet Place 1 tablet (0.4 mg total) under the tongue every 5 (five) minutes x 3 doses as needed for chest pain.     Allergies:   Doxycycline hyclate, Penicillins, and Rifampin   Social History   Socioeconomic History  . Marital status: Married    Spouse name: [name]  . Number of children: 2  . Years of education: 32  . Highest education level: Not on file  Occupational History  . Occupation: RETIRED     Employer: [employer]  Tobacco Use  . Smoking status: Former Smoker     Packs/day: 2.00    Years: 45.00    Pack years: 90.00    Types: Cigarettes    Quit date: 01/11/2012    Years since quitting: 8.0  . Smokeless tobacco: Never Used  . Tobacco comment: He has quit smoking.    Substance and Sexual Activity  . Alcohol use: Yes    Comment: Occasional  . Drug use: No  . Sexual activity: Yes  Other Topics Concern  . 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.     Education:  [employer]    Tobacco Use/Exposure: He has quit smoking.        Alcohol Use: Occasional    Drug Use:  None   Diet:  Regular   Exercise:  He was walking daily until he hurt his hip.     Hobbies:  Fishing, Environmental education officer, Golf    Social Determinants of Radio broadcast assistant Strain: Not on [employer] Insecurity: Not on file  Transportation Needs: Not on file  Physical Activity: Not on file  Stress: Not on file  Social Connections: Not on file     Family History: The patient's family history includes Cancer in his father and paternal grandfather; Coronary artery disease in his brother; Heart attack in his brother and paternal grandfather; Stroke in his mother.  ROS:   Please see the history of present illness.  He has arthritis in his hands and takes meloxicam daily.  All other systems reviewed and are negative.  EKGs/Labs/Other Studies Reviewed:    The following studies were reviewed today: Myoview 01/04/2020-  The left ventricular ejection fraction is mildly decreased (45-54%).  Nuclear stress EF: 49%.  There was no ST segment deviation noted during stress.  Defect 1: There is a small defect of moderate severity present in the apical lateral and apex location.  Findings consistent with prior myocardial infarction.  There is no evidence of ischemia.    EKG:   EKG is not ordered today.  The ekg ordered 12/28/2019 demonstrates NSR, SB, HR 54, mild LVH  Recent Labs: 03/30/2019: ALT 24 12/27/2019: Hemoglobin 14.7; Platelets 171 12/28/2019: BUN 24; Creatinine, Ser 1.06; Potassium 4.9; Sodium 138  Recent Lipid Panel    Component Value Date/Time   CHOL 84 12/28/2019 0300   CHOL 100 03/30/2019 0902   TRIG 43 12/28/2019 0300   HDL 33 (L) 12/28/2019 0300   HDL 40 03/30/2019 0902   CHOLHDL 2.5 12/28/2019 0300   VLDL 9 12/28/2019 0300   LDLCALC 42 12/28/2019  0300   LDLCALC 50 03/30/2019 0902    Physical Exam:    VS:  BP 124/77   Pulse 91   Temp 97.7 F (36.5 C)   Ht 5\' 10"  (1.778 m)   Wt 234 lb 12.8 oz (106.5 kg)   SpO2 94%   BMI 33.69 kg/m     Wt Readings from Last 3 Encounters:  01/26/20 234 lb 12.8 oz (106.5 kg)  01/04/20 225 lb (102.1 kg)  12/27/19 225 lb (102.1 kg)     GEN: Well nourished, well developed in no acute distress HEENT: Normal NECK: No JVD; No carotid bruits CARDIAC: RRR, no murmurs, rubs, gallops RESPIRATORY:  Clear to auscultation without rales, wheezing or rhonchi  ABDOMEN: Soft,  non-distended MUSCULOSKELETAL:  No edema; No deformity  SKIN: Warm and dry NEUROLOGIC:  Alert and oriented x 3 PSYCHIATRIC:  Normal affect   ASSESSMENT:    Chest pain of uncertain etiology Admitted 12/07-12/08/2019 Low risk Myoview as an OP 01/04/2020  CAD S/P RI PCI RI PCI in the setting of STEMI 10/19/17. Residual occluded mRCA with collaterals  Dyslipidemia, goal LDL below 70 LDL 42 Dec 2021  Bradycardia Asymptomatic- Not on BB  PLAN:    Same Rx- f/u 6 months.    Medication Adjustments/Labs and Tests Ordered: Current medicines are reviewed at length with the patient today.  Concerns regarding medicines are outlined above.  No orders of the defined types were placed in this encounter.  No orders of the defined types were placed in this encounter.   There are no Patient Instructions on file for this visit.    Signed, 01-12-1989, PA-C  01/26/2020 8:35 AM     [facility]

## 2020-01-26 NOTE — Patient Instructions (Addendum)
Medication Instructions:  Your physician recommends that you continue on your current medications as directed. Please refer to the Current Medication list given to you today.  *If you need a refill on your cardiac medications before your next appointment, please call your pharmacy*   Follow-Up: At [facility], you and your health needs are our priority.  As part of our continuing mission to provide you with exceptional heart care, we have created designated Provider Care Teams.  These Care Teams include your primary Cardiologist (physician) and Advanced Practice Providers (APPs -  Physician Assistants and Nurse Practitioners) who all work together to provide you with the care you need, when you need it.  We recommend signing up for the patient portal called "MyChart".  Sign up information is provided on this After Visit Summary.  MyChart is used to connect with patients for Virtual Visits (Telemedicine).  Patients are able to view lab/test results, encounter notes, upcoming appointments, etc.  Non-urgent messages can be sent to your provider as well.   To learn more about what you can do with MyChart, go to [email or web address].    Your next appointment:   6 month(s)  The format for your next appointment:   In Person  Provider:   Olga Millers, MD [facility] )

## 2020-03-08 ENCOUNTER — Telehealth: Payer: Self-pay

## 2020-03-08 NOTE — Telephone Encounter (Signed)
   Primary Cardiologist: Kirk Ruths, MD  Chart reviewed as part of pre-operative protocol coverage. Given past medical history and time since last visit, based on ACC/AHA guidelines, Alec Johnson would be at acceptable risk for the planned procedure without further cardiovascular testing.   He was recently seen in the ED for chest pain. He was ultimately discharged with plan for OP stress test. This was performed 01/04/20 which showed a low normal LVEF with a small defect consistent with prior infarct but no ischemia was noted. He was then seen in follow up and had tried an antacid with complete relief and no return of symptoms upon speaking to him today. Given this, he may hold ASA for 3-5 days prior to procedure then resume once stable from a bleeding standpoint thereafter.   The patient was advised that if he develops new symptoms prior to surgery to contact our office to arrange for a follow-up visit, and he verbalized understanding.  I will route this recommendation to the requesting party via Epic fax function and remove from pre-op pool.  Please call with questions.  Kathyrn Drown, NP 03/08/2020, 10:52 AM

## 2020-03-08 NOTE — Telephone Encounter (Signed)
   [facility] Pre-operative Risk Assessment    Request for surgical clearance:  1. What type of surgery is being performed  Right shoulder SAD/DCR/RCR  2. When is this surgery scheduled TBD  3. What type of clearance is required Both  4. Are there any medications that need to be held prior to surgery and how long Aspirin  5. Practice name and name of physician performing surgery [facility]  Dr.Andrew Collins  6. What is the office phone number [phone]   7.   What is the office fax number [phone]  8.   Anesthesia type Not Listed   Kathyrn Lass 03/08/2020, 10:13 AM  _________________________________________________________________   (provider comments below)

## 2020-03-21 ENCOUNTER — Other Ambulatory Visit: Payer: Self-pay | Admitting: Cardiology

## 2020-04-12 ENCOUNTER — Encounter: Payer: Self-pay | Admitting: Physical Therapy

## 2020-04-12 ENCOUNTER — Ambulatory Visit: Payer: Medicare Other | Attending: Specialist | Admitting: Physical Therapy

## 2020-04-12 ENCOUNTER — Other Ambulatory Visit: Payer: Self-pay

## 2020-04-12 DIAGNOSIS — R293 Abnormal posture: Secondary | ICD-10-CM | POA: Insufficient documentation

## 2020-04-12 DIAGNOSIS — M6281 Muscle weakness (generalized): Secondary | ICD-10-CM | POA: Insufficient documentation

## 2020-04-12 DIAGNOSIS — M25611 Stiffness of right shoulder, not elsewhere classified: Secondary | ICD-10-CM | POA: Diagnosis present

## 2020-04-12 DIAGNOSIS — M25511 Pain in right shoulder: Secondary | ICD-10-CM | POA: Diagnosis present

## 2020-04-12 NOTE — Therapy (Signed)
[facility] [address]  [place], [place], [address] Phone: [phone]   Fax:  [phone]  Physical Therapy Evaluation  Patient Details  Name: Alec Johnson MRN: [id number] Date of Birth: 1950-03-15 Referring Provider (PT): Sydnee Cabal, MD   Encounter Date: 04/12/2020   PT End of Session - 04/12/20 1202    Visit Number 1    Number of Visits 17    Date for PT Re-Evaluation 06/07/20    Authorization Type UHC Medicare    PT Start Time 1779    PT Stop Time 1053    PT Time Calculation (min) 38 min    Activity Tolerance Patient tolerated treatment well    Behavior During Therapy [facility] for tasks assessed/performed           Past Medical History:  Diagnosis Date  . Colon polyp   . Diverticular disease   . Erectile dysfunction   . GERD (gastroesophageal reflux disease)    NO Recent problems  . Hyperlipidemia   . Osteoarthritis   . Seborrheic keratosis   . STEMI (ST elevation myocardial infarction) ([place])    10/19/17 PCI/DES to ramus with CTO of the mRCA with collaterals, normal EF    Past Surgical History:  Procedure Laterality Date  . athroscopic knee surgery     BIL KNEES  . CORONARY/GRAFT ACUTE MI REVASCULARIZATION N/A 10/19/2017   Procedure: Coronary/Graft Acute MI Revascularization;  Surgeon: Jettie Booze, MD;  Location: [facility];  Service: Cardiovascular;  Laterality: N/A;  . LEFT HEART CATH AND CORONARY ANGIOGRAPHY N/A 10/19/2017   Procedure: LEFT HEART CATH AND CORONARY ANGIOGRAPHY;  Surgeon: Jettie Booze, MD;  Location: [facility];  Service: Cardiovascular;  Laterality: N/A;  . LEG SURGERY  1966   PINNING DUE TO INJURY  . NOSE SURGERY    . TOTAL KNEE ARTHROPLASTY Right 01/18/2014   Procedure: RIGHT TOTAL KNEE ARTHROPLASTY;  Surgeon: Tobi Bastos, MD;  Location: [facility];  Service: Orthopedics;  Laterality: Right;    There were no vitals filed for this  visit.    Subjective Assessment - 04/12/20 1017    Subjective Patient reports undergoing R RTC repair on 04/05/20. Wearing sling throughout the day and when sleeping. Notes that the incisions are looking good. Very little pain- not taking much pain medicine. Moving it around in the sling a little bit does hurt, also sometimes has pain at night. Using ice 5-6x/day for 30 min at a time. Would like to return to golf.    Pertinent History STEMI 2019, HLD, GERD, B knee arthroscopy, R TKA 2015    Limitations Lifting;House hold activities    Diagnostic tests none recent    Patient Stated Goals decrease pain/return to golf    Currently in Pain? Yes    Pain Score 0-No pain    Pain Location Shoulder    Pain Orientation Anterior;Right    Pain Descriptors / Indicators Discomfort;Dull    Pain Type Acute pain;Surgical pain              [facility] PT Assessment - 04/12/20 1022      Assessment   Medical Diagnosis Encounter for other orthopedic aftercare- s/p R RTC repair, SAD, DCR    Referring Provider (PT) Sydnee Cabal, MD    Onset Date/Surgical Date 04/05/20    Hand Dominance Right    Next MD Visit 04/18/20    Prior Therapy yes  Precautions   Precautions None    Precaution Comments R shoulder in sling at all times; no lifting, excessive shoulder extension, WBing      Balance Screen   Has the patient fallen in the past 6 months No    Has the patient had a decrease in activity level because of a fear of falling?  No    Is the patient reluctant to leave their home because of a fear of falling?  No      Home Ecologist residence    Living Arrangements Spouse/significant other    Available Help at Discharge Family    Type of [place]      Prior Function   Level of [place] Retired    Leisure golf, fishing      Cognition   Overall Cognitive Status Within Functional Limits for tasks assessed      Observation/Other  Assessments   Observations R shoulder in abduction sling; incisions with stitches still in, covered by bandaids      Sensation   Light Touch Appears Intact      Coordination   Gross Motor Movements are Fluid and Coordinated Yes      Posture/Postural Control   Posture/Postural Control Postural limitations    Postural Limitations Rounded Shoulders;Forward head      ROM / Strength   AROM / PROM / Strength AROM;PROM;Strength      AROM   AROM Assessment Site Shoulder    Right/Left Shoulder Left    Left Shoulder Flexion 159 Degrees    Left Shoulder ABduction 175 Degrees    Left Shoulder Internal Rotation --   FIR T6   Left Shoulder External Rotation --   FER T1     PROM   PROM Assessment Site Shoulder    Right/Left Shoulder Right    Right Shoulder Flexion 85 Degrees    Right Shoulder ABduction 90 Degrees   scaption   Right Shoulder Internal Rotation 35 Degrees   in scapular plane   Right Shoulder External Rotation 28 Degrees   in scapular plane     Strength   Strength Assessment Site Shoulder    Right/Left Shoulder Left    Left Shoulder Flexion 4+/5    Left Shoulder ABduction 4+/5    Left Shoulder Internal Rotation 4+/5    Left Shoulder External Rotation 4+/5      Palpation   Palpation comment TTP with gentle palpation over R distal clavicle                      Objective measurements completed on examination: See above findings.               PT Education - 04/12/20 1201    Education Details prognosis, POC, HEP; extensive edu on post-op precautions, sling use, incision care, and explanation of patient's surgery    Person(s) Educated Patient    Methods Explanation;Demonstration;Tactile cues;Verbal cues;Handout    Comprehension Verbalized understanding;Returned demonstration            PT Short Term Goals - 04/12/20 1208      PT SHORT TERM GOAL #1   Title Patient to be independent with initial HEP.    Time 3    Period Weeks    Status New     Target Date 05/03/20             PT Long Term Goals - 04/12/20 1209  PT LONG TERM GOAL #1   Title Patient to be independent with advanced HEP.    Time 8    Period Weeks    Status New    Target Date 06/07/20      PT LONG TERM GOAL #2   Title Patient to demonstrate R UE strength >/=4+/5.    Time 8    Period Weeks    Status New    Target Date 06/07/20      PT LONG TERM GOAL #3   Title Patient to demonstrate R shoulder AROM WFL and without pain limiting.    Time 8    Period Weeks    Status New    Target Date 06/07/20      PT LONG TERM GOAL #4   Title Patient to demonstrate L UE overhead lift with 5lbs with good scapulohumeral rhythm.    Time 8    Period Weeks    Status New    Target Date 06/07/20                  Plan - 04/12/20 1202    Clinical Impression Statement Patient is a 70 y/o M presenting to OPPT with c/o R shoulder pain and weakness s/p L RTC repair, subacromial decompression, and distal clavicle resection on 04/05/20. Patient reports compliance with sling use. Noting anterior shoulder pain in PM and when moving his arm in the sling slightly. Would like to return to golf and fishing, which he is limited from at this time. Patient today presenting with rounded shoulders and forward head posture, limited R shoulder PROM, L UE weakness, and TTP over R distal clavicle. Patient was educated on gentle pendulum and distal joint AROM HEP as well as post-op precautions and sling use- patient reported understanding. Would benefit from skilled PT services 2x/week for 8 weeks to address aforementioned impairments.    Personal Factors and Comorbidities Age;Comorbidity 3+;Fitness;Past/Current Experience;Time since onset of injury/illness/exacerbation    Comorbidities STEMI 2019, HLD, GERD, B knee arthroscopy, R TKA 2015    Examination-Activity Limitations Sleep;Bed Mobility;Carry;Transfers;Dressing;Hygiene/Grooming;Lift;Reach Overhead;Self Feeding     Examination-Participation Restrictions Cleaning;Shop;Community Activity;Driving;Yard Work;Laundry;Meal Prep    Stability/Clinical Decision Making Stable/Uncomplicated    Clinical Decision Making Low    Rehab Potential Good    PT Frequency 2x / week    PT Duration 8 weeks    PT Treatment/Interventions ADLs/Self Care Home Management;Cryotherapy;Electrical Stimulation;Moist Heat;Therapeutic exercise;Therapeutic activities;Functional mobility training;Ultrasound;Neuromuscular re-education;Patient/family education;Manual techniques;Vasopneumatic Device;Taping;Energy conservation;Dry needling;Passive range of motion;Scar mobilization    PT Next Visit Plan shoulder FOTO; reassess HEP; progress per protocol    Consulted and Agree with Plan of Care Patient           Patient will benefit from skilled therapeutic intervention in order to improve the following deficits and impairments:  Hypomobility,Increased edema,Decreased scar mobility,Decreased activity tolerance,Decreased strength,Pain,Impaired UE functional use,Increased fascial restricitons,Decreased knowledge of precautions,Increased muscle spasms,Improper body mechanics,Decreased range of motion,Impaired flexibility,Postural dysfunction  Visit Diagnosis: Acute pain of right shoulder  Stiffness of right shoulder, not elsewhere classified  Muscle weakness (generalized)  Abnormal posture     Problem List Patient Active Problem List   Diagnosis Date Noted  . Chest pain of uncertain etiology 16/10/9602  . Dyspnea 11/26/2017  . CAD S/P RI PCI 10/29/2017  . Essential hypertension 10/29/2017  . History of ST elevation myocardial infarction (STEMI) 10/19/2017  . 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 Rash and nonspecific skin eruption 04/30/2013  . Cellulitis of finger of left hand 04/30/2013  .  Routine general medical examination at a health care facility 01/22/2013  . BPH (benign prostatic hypertrophy) 11/28/2012  . Need for  prophylactic vaccination and inoculation against influenza 11/28/2012  . Bradycardia 10/01/2011  . Tobacco abuse 10/01/2011  . Diverticular disease   . Neck pain   . Dyslipidemia, goal LDL below 70   . Allergic rhinitis   . Allergic sinusitis   . GERD (gastroesophageal reflux disease)   . Seborrheic keratosis   . Osteoarthritis   . Erectile dysfunction   . Colon polyp      Janene Harvey, PT, DPT 04/12/20 12:13 PM   [facility] [address]  [place], [place], [address] Phone: [phone]   Fax:  [phone]  Name: Alec Johnson MRN: [id number] Date of Birth: [birth year]

## 2020-04-17 ENCOUNTER — Other Ambulatory Visit: Payer: Self-pay

## 2020-04-17 ENCOUNTER — Encounter: Payer: Self-pay | Admitting: Physical Therapy

## 2020-04-17 ENCOUNTER — Ambulatory Visit: Payer: Medicare Other | Admitting: Physical Therapy

## 2020-04-17 DIAGNOSIS — R293 Abnormal posture: Secondary | ICD-10-CM

## 2020-04-17 DIAGNOSIS — M6281 Muscle weakness (generalized): Secondary | ICD-10-CM

## 2020-04-17 DIAGNOSIS — M25611 Stiffness of right shoulder, not elsewhere classified: Secondary | ICD-10-CM

## 2020-04-17 DIAGNOSIS — M25511 Pain in right shoulder: Secondary | ICD-10-CM

## 2020-04-17 NOTE — Therapy (Signed)
[facility] [address]  [place], [place], [address] Phone: [phone]   Fax:  [phone]  Physical Therapy Treatment  Patient Details  Name: Alec Johnson MRN: [id number] Date of Birth: 1950/09/22 Referring Provider (PT): Sydnee Cabal, MD   Encounter Date: 04/17/2020   PT End of Session - 04/17/20 1526    Visit Number 2    Number of Visits 17    Date for PT Re-Evaluation 06/07/20    Authorization Type [employer] Medicare    PT Start Time 3825    PT Stop Time 1526    PT Time Calculation (min) 39 min    Activity Tolerance Patient tolerated treatment well    Behavior During Therapy [facility] for tasks assessed/performed           Past Medical History:  Diagnosis Date  . Colon polyp   . Diverticular disease   . Erectile dysfunction   . GERD (gastroesophageal reflux disease)    NO Recent problems  . Hyperlipidemia   . Osteoarthritis   . Seborrheic keratosis   . STEMI (ST elevation myocardial infarction) ([place])    10/19/17 PCI/DES to ramus with CTO of the mRCA with collaterals, normal EF    Past Surgical History:  Procedure Laterality Date  . athroscopic knee surgery     BIL KNEES  . CORONARY/GRAFT ACUTE MI REVASCULARIZATION N/A 10/19/2017   Procedure: Coronary/Graft Acute MI Revascularization;  Surgeon: Jettie Booze, MD;  Location: [facility];  Service: Cardiovascular;  Laterality: N/A;  . LEFT HEART CATH AND CORONARY ANGIOGRAPHY N/A 10/19/2017   Procedure: LEFT HEART CATH AND CORONARY ANGIOGRAPHY;  Surgeon: Jettie Booze, MD;  Location: [facility];  Service: Cardiovascular;  Laterality: N/A;  . LEG SURGERY  1966   PINNING DUE TO INJURY  . NOSE SURGERY    . TOTAL KNEE ARTHROPLASTY Right 01/18/2014   Procedure: RIGHT TOTAL KNEE ARTHROPLASTY;  Surgeon: Tobi Bastos, MD;  Location: [facility];  Service: Orthopedics;  Laterality: Right;    There were no vitals filed for this  visit.   Subjective Assessment - 04/17/20 1449    Subjective Has been performing HEP 2x a day and using ice. Denies questions on HEP. Seeing MD tomorrow.    Pertinent History STEMI 2019, HLD, GERD, B knee arthroscopy, R TKA 2015    Diagnostic tests none recent    Patient Stated Goals decrease pain/return to golf    Currently in Pain? No/denies                             [facility] Adult PT Treatment/Exercise - 04/17/20 0001      Exercises   Exercises Shoulder;Elbow      Elbow Exercises   Other elbow exercises R elbow flexion/extension AROM with towel roll under elbow 10x      Shoulder Exercises: Seated   External Rotation AAROM;Right;10 reps    External Rotation Limitations 2x10; in scapular plane with shoulder elevated on pillow   within pain-free ROm   Other Seated Exercises R UT stretch, R LS stretch 30"   cues to decrease guarding   Other Seated Exercises B shoulder shrugs 10x, scapular retraction 10x3"   focusing on shoulder depression     Shoulder Exercises: Standing   Other Standing Exercises R shoulder pendulums 30" each A/P, CW, CCW   c/o clicking in the R knee  Shoulder Exercises: Isometric Strengthening   Flexion 5X5"    Flexion Limitations 2 finger push with shoulder in neutral with towel roll under elbow; using 15-30% effort    External Rotation 5X5"    External Rotation Limitations 2 finger push with shoulder in neutral with towel roll under elbow; using 15-30% effort    Internal Rotation 5X5"    Internal Rotation Limitations 2 finger push with shoulder in neutral with towel roll under elbow; using 15-30% effort      Manual Therapy   Manual Therapy Joint mobilization;Passive ROM    Joint Mobilization gentle R shoulder grade II/III GH jt mobs to tolerance    Passive ROM R shoulder flexion, IR, ER PROM within pain-free motion according to protocol limits                  PT Education - 04/17/20 1526    Education Details update to HEP     Person(s) Educated Patient    Methods Explanation;Demonstration;Tactile cues;Verbal cues;Handout    Comprehension Verbalized understanding;Returned demonstration            PT Short Term Goals - 04/17/20 1527      PT SHORT TERM GOAL #1   Title Patient to be independent with initial HEP.    Time 3    Period Weeks    Status Achieved    Target Date 05/03/20             PT Long Term Goals - 04/17/20 1527      PT LONG TERM GOAL #1   Title Patient to be independent with advanced HEP.    Time 8    Period Weeks    Status On-going      PT LONG TERM GOAL #2   Title Patient to demonstrate R UE strength >/=4+/5.    Time 8    Period Weeks    Status On-going      PT LONG TERM GOAL #3   Title Patient to demonstrate R shoulder AROM WFL and without pain limiting.    Time 8    Period Weeks    Status On-going      PT LONG TERM GOAL #4   Title Patient to demonstrate L UE overhead lift with 5lbs with good scapulohumeral rhythm.    Time 8    Period Weeks    Status On-going                 Plan - 04/17/20 1526    Clinical Impression Statement Patient without complaints at beginning of session. Demonstrated pendulums with improved form/decreased muscle guarding. Tolerated R shoulder PROM within pain-free range according to protocol limits. Tolerated gentle GH joint mobilizations without complaints. Patient still very guarded with PROM and ther-ex, thus worked on gentle cervical stretching and postural correction work to address this. Initiated very gentle isometrics with patient performing this pain-free, however requiring continuous cues to perform with submax effort. Updated HEP with shoulder IR/ER AAROM which was well-tolerated today. Patient reported understanding and without complaints at end of session.    Comorbidities STEMI 2019, HLD, GERD, B knee arthroscopy, R TKA 2015    PT Treatment/Interventions ADLs/Self Care Home Management;Cryotherapy;Electrical  Stimulation;Moist Heat;Therapeutic exercise;Therapeutic activities;Functional mobility training;Ultrasound;Neuromuscular re-education;Patient/family education;Manual techniques;Vasopneumatic Device;Taping;Energy conservation;Dry needling;Passive range of motion;Scar mobilization    PT Next Visit Plan shoulder FOTO; progress per protocol    Consulted and Agree with Plan of Care Patient           Patient will  benefit from skilled therapeutic intervention in order to improve the following deficits and impairments:  Hypomobility,Increased edema,Decreased scar mobility,Decreased activity tolerance,Decreased strength,Pain,Impaired UE functional use,Increased fascial restricitons,Decreased knowledge of precautions,Increased muscle spasms,Improper body mechanics,Decreased range of motion,Impaired flexibility,Postural dysfunction  Visit Diagnosis: Acute pain of right shoulder  Stiffness of right shoulder, not elsewhere classified  Muscle weakness (generalized)  Abnormal posture     Problem List Patient Active Problem List   Diagnosis Date Noted  . Chest pain of uncertain etiology 97/41/6384  . Dyspnea 11/26/2017  . CAD S/P RI PCI 10/29/2017  . Essential hypertension 10/29/2017  . History of ST elevation myocardial infarction (STEMI) 10/19/2017  . 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 Rash and nonspecific skin eruption 04/30/2013  . Cellulitis of finger of left hand 04/30/2013  . Routine general medical examination at a health care facility 01/22/2013  . BPH (benign prostatic hypertrophy) 11/28/2012  . Need for prophylactic vaccination and inoculation against influenza 11/28/2012  . Bradycardia 10/01/2011  . Tobacco abuse 10/01/2011  . Diverticular disease   . Neck pain   . Dyslipidemia, goal LDL below 70   . Allergic rhinitis   . Allergic sinusitis   . GERD (gastroesophageal reflux disease)   . Seborrheic keratosis   . Osteoarthritis   . Erectile dysfunction   . Colon  polyp      Janene Harvey, PT, DPT 04/17/20 3:29 PM   [facility] [address]  [place], [place], [address] Phone: [phone]   Fax:  [phone]  Name: Alec Johnson MRN: [id number] Date of Birth: 01/25/1950

## 2020-04-19 ENCOUNTER — Encounter: Payer: Self-pay | Admitting: Physical Therapy

## 2020-04-19 ENCOUNTER — Other Ambulatory Visit: Payer: Self-pay

## 2020-04-19 ENCOUNTER — Ambulatory Visit: Payer: Medicare Other | Admitting: Physical Therapy

## 2020-04-19 DIAGNOSIS — M25611 Stiffness of right shoulder, not elsewhere classified: Secondary | ICD-10-CM

## 2020-04-19 DIAGNOSIS — R293 Abnormal posture: Secondary | ICD-10-CM

## 2020-04-19 DIAGNOSIS — M25511 Pain in right shoulder: Secondary | ICD-10-CM | POA: Diagnosis not present

## 2020-04-19 DIAGNOSIS — M6281 Muscle weakness (generalized): Secondary | ICD-10-CM

## 2020-04-19 NOTE — Therapy (Signed)
[facility] [address]  [place], [place], [address] Phone: [phone]   Fax:  [phone]  Physical Therapy Treatment  Patient Details  Name: Alec Johnson MRN: [id number] Date of Birth: [birth year] Referring Provider (PT): Sydnee Cabal, MD   Encounter Date: 04/19/2020   PT End of Session - 04/19/20 1750    Visit Number 3    Number of Visits 17    Date for PT Re-Evaluation 06/07/20    Authorization Type [employer] Medicare    PT Start Time 1701    PT Stop Time 4920    PT Time Calculation (min) 46 min    Activity Tolerance Patient tolerated treatment well    Behavior During Therapy [facility] for tasks assessed/performed           Past Medical History:  Diagnosis Date  . Colon polyp   . Diverticular disease   . Erectile dysfunction   . GERD (gastroesophageal reflux disease)    NO Recent problems  . Hyperlipidemia   . Osteoarthritis   . Seborrheic keratosis   . STEMI (ST elevation myocardial infarction) ([place])    10/19/17 PCI/DES to ramus with CTO of the mRCA with collaterals, normal EF    Past Surgical History:  Procedure Laterality Date  . athroscopic knee surgery     BIL KNEES  . CORONARY/GRAFT ACUTE MI REVASCULARIZATION N/A 10/19/2017   Procedure: Coronary/Graft Acute MI Revascularization;  Surgeon: Jettie Booze, MD;  Location: [facility];  Service: Cardiovascular;  Laterality: N/A;  . LEFT HEART CATH AND CORONARY ANGIOGRAPHY N/A 10/19/2017   Procedure: LEFT HEART CATH AND CORONARY ANGIOGRAPHY;  Surgeon: Jettie Booze, MD;  Location: [facility];  Service: Cardiovascular;  Laterality: N/A;  . LEG SURGERY  1966   PINNING DUE TO INJURY  . NOSE SURGERY    . TOTAL KNEE ARTHROPLASTY Right 01/18/2014   Procedure: RIGHT TOTAL KNEE ARTHROPLASTY;  Surgeon: Tobi Bastos, MD;  [facility];  Service: Orthopedics;  Laterality: Right;    There were no vitals filed for this  visit.   Subjective Assessment - 04/19/20 1702    Subjective Patient arrived to session with referral from MD with order for continued therapy and "no biceps loading for 8 weeks." Pt reports to remove the abduction pillow in 2 weeks.    Pertinent History STEMI 2019, HLD, GERD, B knee arthroscopy, R TKA 2015    Diagnostic tests none recent    Patient Stated Goals decrease pain/return to golf    Currently in Pain? Yes    Pain Score 2     Pain Location Shoulder    Pain Orientation Right;Anterior    Pain Descriptors / Indicators Discomfort;Dull    Pain Type Acute pain;Surgical pain              [facility] PT Assessment - 04/19/20 0001      Precautions   Precaution Comments R shoulder in sling at all times; no lifting, excessive shoulder extension, WBing; no biceps loading for 8 weeks      Observation/Other Assessments   Focus on Therapeutic Outcomes (FOTO)  Shoulder: 4.3      PROM   Right Shoulder Flexion 105 Degrees    Right Shoulder ABduction 105 Degrees   scaption   Right Shoulder Internal Rotation 45 Degrees   scapular plane   Right Shoulder External Rotation 40 Degrees   scapular plane  [facility] Adult PT Treatment/Exercise - 04/19/20 0001      Shoulder Exercises: Supine   External Rotation Right;10 reps;PROM    External Rotation Limitations PROM with wand to tolerance in scapular plane; 2x10   cueing to perform within protocol limits     Shoulder Exercises: Standing   Other Standing Exercises R shoulder pendulums 30" each A/P, CW, CCW      Shoulder Exercises: Isometric Strengthening   Flexion 5X5"    Flexion Limitations 2 finger push with shoulder in neutral with towel roll under elbow; using 15-30% effort    Extension 5X5"    Extension Limitations shoulder in neutral with towel roll under elbow; using 15-30% effort    External Rotation 5X5"    External Rotation Limitations 2 finger push with shoulder in neutral with towel roll under  elbow; using 15-30% effort    Internal Rotation 5X5"    Internal Rotation Limitations 2 finger push with shoulder in neutral with towel roll under elbow; using 15-30% effort    ABduction 5X5"    ABduction Limitations 2 finger push with shoulder in neutral with towel roll under elbow; using 15-30% effort      Manual Therapy   Manual Therapy Joint mobilization;Passive ROM    Joint Mobilization gentle R shoulder grade II/III GH jt mobs to tolerance    Passive ROM R shoulder flexion, IR, ER PROM within pain-free motion according to protocol limits                  PT Education - 04/19/20 1749    Education Details update to HEP; discussion on the procedures that were involved in patient's surgery and resultant post-op precautions    Person(s) Educated Patient    Methods Explanation;Demonstration;Tactile cues;Verbal cues;Handout    Comprehension Verbalized understanding;Returned demonstration            PT Short Term Goals - 04/17/20 1527      PT SHORT TERM GOAL #1   Title Patient to be independent with initial HEP.    Time 3    Period Weeks    Status Achieved    Target Date 05/03/20             PT Long Term Goals - 04/17/20 1527      PT LONG TERM GOAL #1   Title Patient to be independent with advanced HEP.    Time 8    Period Weeks    Status On-going      PT LONG TERM GOAL #2   Title Patient to demonstrate R UE strength >/=4+/5.    Time 8    Period Weeks    Status On-going      PT LONG TERM GOAL #3   Title Patient to demonstrate R shoulder AROM WFL and without pain limiting.    Time 8    Period Weeks    Status On-going      PT LONG TERM GOAL #4   Title Patient to demonstrate L UE overhead lift with 5lbs with good scapulohumeral rhythm.    Time 8    Period Weeks    Status On-going                 Plan - 04/19/20 1750    Clinical Impression Statement Patient arrived to session with referral from MD with order for continued therapy and "no  biceps loading for 8 weeks" d/t biceps tenotomy. Pt reports that he was advised to remove the abduction pillow in 2  weeks. Patient again tolerated R shoulder joint mobilizations and PROM within protocol limits well. R shoulder ROM now meets protocol limits and is pain-free. Reviewed shoulder IR/ER PROM with cueing to perform within protocol limits. Also reviewed gentle isometrics with shoulder in neutral which patient performed well- updated these into HEP. Patient reported understanding. At this time patient is pain-free in sessions has met goals for this stage of healing.    Comorbidities STEMI 2019, HLD, GERD, B knee arthroscopy, R TKA 2015    PT Treatment/Interventions ADLs/Self Care Home Management;Cryotherapy;Electrical Stimulation;Moist Heat;Therapeutic exercise;Therapeutic activities;Functional mobility training;Ultrasound;Neuromuscular re-education;Patient/family education;Manual techniques;Vasopneumatic Device;Taping;Energy conservation;Dry needling;Passive range of motion;Scar mobilization    PT Next Visit Plan progress per protocol    Consulted and Agree with Plan of Care Patient           Patient will benefit from skilled therapeutic intervention in order to improve the following deficits and impairments:  Hypomobility,Increased edema,Decreased scar mobility,Decreased activity tolerance,Decreased strength,Pain,Impaired UE functional use,Increased fascial restricitons,Decreased knowledge of precautions,Increased muscle spasms,Improper body mechanics,Decreased range of motion,Impaired flexibility,Postural dysfunction  Visit Diagnosis: Acute pain of right shoulder  Stiffness of right shoulder, not elsewhere classified  Muscle weakness (generalized)  Abnormal posture     Problem List Patient Active Problem List   Diagnosis Date Noted  . Chest pain of uncertain etiology 00/45/9977  . Dyspnea 11/26/2017  . CAD S/P RI PCI 10/29/2017  . Essential hypertension 10/29/2017  . History  of ST elevation myocardial infarction (STEMI) 10/19/2017  . 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 Rash and nonspecific skin eruption 04/30/2013  . Cellulitis of finger of left hand 04/30/2013  . Routine general medical examination at a health care facility 01/22/2013  . BPH (benign prostatic hypertrophy) 11/28/2012  . Need for prophylactic vaccination and inoculation against influenza 11/28/2012  . Bradycardia 10/01/2011  . Tobacco abuse 10/01/2011  . Diverticular disease   . Neck pain   . Dyslipidemia, goal LDL below 70   . Allergic rhinitis   . Allergic sinusitis   . GERD (gastroesophageal reflux disease)   . Seborrheic keratosis   . Osteoarthritis   . Erectile dysfunction   . Colon polyp      Janene Harvey, PT, DPT 04/19/20 5:59 PM   [facility] [address]  [place], [place], [address] Phone: [phone]   Fax:  [phone]  Name: KASTIN CERDA MRN: [id number] Date of Birth: [birth year]

## 2020-04-24 ENCOUNTER — Encounter: Payer: Medicare Other | Admitting: Physical Therapy

## 2020-04-26 ENCOUNTER — Other Ambulatory Visit: Payer: Self-pay

## 2020-04-26 ENCOUNTER — Encounter: Payer: Self-pay | Admitting: Physical Therapy

## 2020-04-26 ENCOUNTER — Ambulatory Visit: Payer: Medicare Other | Attending: Specialist | Admitting: Physical Therapy

## 2020-04-26 DIAGNOSIS — M25611 Stiffness of right shoulder, not elsewhere classified: Secondary | ICD-10-CM | POA: Insufficient documentation

## 2020-04-26 DIAGNOSIS — R293 Abnormal posture: Secondary | ICD-10-CM | POA: Insufficient documentation

## 2020-04-26 DIAGNOSIS — M25511 Pain in right shoulder: Secondary | ICD-10-CM | POA: Insufficient documentation

## 2020-04-26 DIAGNOSIS — M6281 Muscle weakness (generalized): Secondary | ICD-10-CM | POA: Diagnosis present

## 2020-04-26 NOTE — Therapy (Signed)
[facility] [address]  [address] [place], [place], [address] Phone: [phone]   Fax:  [phone]  Physical Therapy Treatment  Patient Details  Name: Alec Johnson MRN: [id number] Date of Birth: 10-22-1950 Referring Provider (PT): Eugenia Mcalpine, MD   Encounter Date: 04/26/2020   PT End of Session - 04/26/20 1004    Visit Number 4    Number of Visits 17    Date for PT Re-Evaluation 06/07/20    Authorization Type [employer] Medicare    PT Start Time 0935    PT Stop Time 1002    PT Time Calculation (min) 27 min    Activity Tolerance Patient tolerated treatment well    Behavior During Therapy [facility] for tasks assessed/performed           Past Medical History:  Diagnosis Date  . Colon polyp   . Diverticular disease   . Erectile dysfunction   . GERD (gastroesophageal reflux disease)    NO Recent problems  . Hyperlipidemia   . Osteoarthritis   . Seborrheic keratosis   . STEMI (ST elevation myocardial infarction) (HCC)    10/19/17 PCI/DES to ramus with CTO of the mRCA with collaterals, normal EF    Past Surgical History:  Procedure Laterality Date  . athroscopic knee surgery     BIL KNEES  . CORONARY/GRAFT ACUTE MI REVASCULARIZATION N/A 10/19/2017   Procedure: Coronary/Graft Acute MI Revascularization;  Surgeon: Corky Crafts, MD;  Location: [facility] INVASIVE CV LAB;  Service: Cardiovascular;  Laterality: N/A;  . LEFT HEART CATH AND CORONARY ANGIOGRAPHY N/A 10/19/2017   Procedure: LEFT HEART CATH AND CORONARY ANGIOGRAPHY;  Surgeon: Corky Crafts, MD;  Location: [facility] INVASIVE CV LAB;  Service: Cardiovascular;  Laterality: N/A;  . LEG SURGERY  1966   PINNING DUE TO INJURY  . NOSE SURGERY    . TOTAL KNEE ARTHROPLASTY Right 01/18/2014   Procedure: RIGHT TOTAL KNEE ARTHROPLASTY;  Surgeon: Jacki Cones, MD;  Location: [facility];  Service: Orthopedics;  Laterality: Right;    There were no vitals filed for this visit.    Subjective Assessment - 04/26/20 0935    Subjective Doing fine. No issues/changes since last sesison.    Pertinent History STEMI 2019, HLD, GERD, B knee arthroscopy, R TKA 2015    Diagnostic tests none recent    Patient Stated Goals decrease pain/return to golf    Currently in Pain? No/denies              [facility] PT Assessment - 04/26/20 0001      PROM   Right Shoulder Flexion 105 Degrees    Right Shoulder ABduction 105 Degrees   scaption   Right Shoulder Internal Rotation 45 Degrees   scapular plane   Right Shoulder External Rotation 40 Degrees   scapular plane                        [facility] Adult PT Treatment/Exercise - 04/26/20 0001      Elbow Exercises   Forearm Pronation AROM;Right;10 reps    Forearm Pronation Limitations R elbow pronation/supination AROM in supine    Other elbow exercises supine R elbow flexion/extension AROM with towel roll under elbow 10x3"      Shoulder Exercises: Supine   External Rotation Right;10 reps;PROM    External Rotation Limitations PROM with wand to tolerance in scapular plane   good form and tolerance     Shoulder Exercises:  Standing   Other Standing Exercises R shoulder pendulums 30" each A/P, CW, CCW   cues to avoid active movement     Manual Therapy   Manual Therapy Joint mobilization;Passive ROM;Soft tissue mobilization    Joint Mobilization gentle R shoulder grade II/III distraction    Soft tissue mobilization gentle STM to R shoulder- soft tissue restriction in R pec but without TTP   R humerus appears bruised today   Passive ROM R shoulder flexion, scaption IR, ER PROM within pain-free motion according to protocol limits                  PT Education - 04/26/20 1003    Education Details discussion on current progress and advised patient that he is doing well and has met protocol limits for current stage of rehab with plan to progess according to protocol next session    Person(s) Educated Patient    Methods  Explanation    Comprehension Verbalized understanding            PT Short Term Goals - 04/17/20 1527      PT SHORT TERM GOAL #1   Title Patient to be independent with initial HEP.    Time 3    Period Weeks    Status Achieved    Target Date 05/03/20             PT Long Term Goals - 04/17/20 1527      PT LONG TERM GOAL #1   Title Patient to be independent with advanced HEP.    Time 8    Period Weeks    Status On-going      PT LONG TERM GOAL #2   Title Patient to demonstrate R UE strength >/=4+/5.    Time 8    Period Weeks    Status On-going      PT LONG TERM GOAL #3   Title Patient to demonstrate R shoulder AROM WFL and without pain limiting.    Time 8    Period Weeks    Status On-going      PT LONG TERM GOAL #4   Title Patient to demonstrate L UE overhead lift with 5lbs with good scapulohumeral rhythm.    Time 8    Period Weeks    Status On-going                 Plan - 04/26/20 1004    Clinical Impression Statement Patient arrived with no new complaints. Still wearing sling at start of session and reporting compliance with post-op precautions. Still requiring cueing to decrease guarding with pendulums, however with much improved muscle guarding with PROM. Patient was able to maintain shoulder PROM since last session. Demonstrating some soft tissue in R pecs today but without tenderness. Worked on R elbow AROM in all directions which was well-tolerated. Patient also still tolerating shoulder IR/ER self-PROM well within protocol limits. Patient tolerating sessions well and without complaints at end of session. Plan to progress along protocol next session.    Comorbidities STEMI 2019, HLD, GERD, B knee arthroscopy, R TKA 2015    PT Treatment/Interventions ADLs/Self Care Home Management;Cryotherapy;Electrical Stimulation;Moist Heat;Therapeutic exercise;Therapeutic activities;Functional mobility training;Ultrasound;Neuromuscular re-education;Patient/family  education;Manual techniques;Vasopneumatic Device;Taping;Energy conservation;Dry needling;Passive range of motion;Scar mobilization    PT Next Visit Plan progress per protocol    Consulted and Agree with Plan of Care Patient           Patient will benefit from skilled therapeutic intervention in order to improve  the following deficits and impairments:  Hypomobility,Increased edema,Decreased scar mobility,Decreased activity tolerance,Decreased strength,Pain,Impaired UE functional use,Increased fascial restricitons,Decreased knowledge of precautions,Increased muscle spasms,Improper body mechanics,Decreased range of motion,Impaired flexibility,Postural dysfunction  Visit Diagnosis: Acute pain of right shoulder  Stiffness of right shoulder, not elsewhere classified  Muscle weakness (generalized)  Abnormal posture     Problem List Patient Active Problem List   Diagnosis Date Noted  . Chest pain of uncertain etiology 70/92/9574  . Dyspnea 11/26/2017  . CAD S/P RI PCI 10/29/2017  . Essential hypertension 10/29/2017  . History of ST elevation myocardial infarction (STEMI) 10/19/2017  . 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 Rash and nonspecific skin eruption 04/30/2013  . Cellulitis of finger of left hand 04/30/2013  . Routine general medical examination at a health care facility 01/22/2013  . BPH (benign prostatic hypertrophy) 11/28/2012  . Need for prophylactic vaccination and inoculation against influenza 11/28/2012  . Bradycardia 10/01/2011  . Tobacco abuse 10/01/2011  . Diverticular disease   . Neck pain   . Dyslipidemia, goal LDL below 70   . Allergic rhinitis   . Allergic sinusitis   . GERD (gastroesophageal reflux disease)   . Seborrheic keratosis   . Osteoarthritis   . Erectile dysfunction   . Colon polyp      Janene Harvey, PT, DPT 04/26/20 10:11 AM   [facility] [address]  [place], [place], [address] Phone: [phone]   Fax:  [phone]  Name: Alec Johnson MRN: [id number] Date of Birth: 09-Sep-1950

## 2020-05-01 ENCOUNTER — Encounter: Payer: Medicare Other | Admitting: Physical Therapy

## 2020-05-04 ENCOUNTER — Encounter: Payer: Self-pay | Admitting: Physical Therapy

## 2020-05-04 ENCOUNTER — Ambulatory Visit: Payer: Medicare Other | Admitting: Physical Therapy

## 2020-05-04 ENCOUNTER — Other Ambulatory Visit: Payer: Self-pay

## 2020-05-04 DIAGNOSIS — M25611 Stiffness of right shoulder, not elsewhere classified: Secondary | ICD-10-CM

## 2020-05-04 DIAGNOSIS — M6281 Muscle weakness (generalized): Secondary | ICD-10-CM

## 2020-05-04 DIAGNOSIS — R293 Abnormal posture: Secondary | ICD-10-CM

## 2020-05-04 DIAGNOSIS — M25511 Pain in right shoulder: Secondary | ICD-10-CM

## 2020-05-04 NOTE — Therapy (Signed)
[facility] [address]  [place], [place], [address] Phone: [phone]   Fax:  [phone]  Physical Therapy Treatment  Patient Details  Name: Alec Johnson MRN: [id number] Date of Birth: 09/14/1950 Referring Provider (PT): Sydnee Cabal, MD   Encounter Date: 05/04/2020   PT End of Session - 05/04/20 0927    Visit Number 5    Number of Visits 17    Date for PT Re-Evaluation 06/07/20    Authorization Type [employer] Medicare    PT Start Time 0845    PT Stop Time 0927    PT Time Calculation (min) 42 min    Activity Tolerance Patient tolerated treatment well    Behavior During Therapy [facility] for tasks assessed/performed           Past Medical History:  Diagnosis Date  . Colon polyp   . Diverticular disease   . Erectile dysfunction   . GERD (gastroesophageal reflux disease)    NO Recent problems  . Hyperlipidemia   . Osteoarthritis   . Seborrheic keratosis   . STEMI (ST elevation myocardial infarction) ([place])    10/19/17 PCI/DES to ramus with CTO of the mRCA with collaterals, normal EF    Past Surgical History:  Procedure Laterality Date  . athroscopic knee surgery     BIL KNEES  . CORONARY/GRAFT ACUTE MI REVASCULARIZATION N/A 10/19/2017   Procedure: Coronary/Graft Acute MI Revascularization;  Surgeon: Jettie Booze, MD;  Location: [facility] CV [facility];  Service: Cardiovascular;  Laterality: N/A;  . LEFT HEART CATH AND CORONARY ANGIOGRAPHY N/A 10/19/2017   Procedure: LEFT HEART CATH AND CORONARY ANGIOGRAPHY;  Surgeon: Jettie Booze, MD;  Location: [facility] CV [facility];  Service: Cardiovascular;  Laterality: N/A;  . LEG SURGERY  1966   PINNING DUE TO INJURY  . NOSE SURGERY    . TOTAL KNEE ARTHROPLASTY Right 01/18/2014   Procedure: RIGHT TOTAL KNEE ARTHROPLASTY;  Surgeon: Tobi Bastos, MD;  Location: [facility];  Service: Orthopedics;  Laterality: Right;    There were no vitals filed for this  visit.   Subjective Assessment - 05/04/20 0845    Subjective Shoulder has been doing well. Has taken his abduction pillow out of the sling.    Pertinent History STEMI 2019, HLD, GERD, B knee arthroscopy, R TKA 2015    Diagnostic tests none recent    Patient Stated Goals decrease pain/return to golf    Currently in Pain? No/denies              [facility] PT Assessment - 05/04/20 0001      PROM   Right Shoulder Flexion 120 Degrees    Right Shoulder ABduction 110 Degrees    Right Shoulder Internal Rotation 45 Degrees   scapular plane   Right Shoulder External Rotation 45 Degrees   scapular planes                        [facility] Adult PT Treatment/Exercise - 05/04/20 0001      Shoulder Exercises: Supine   Flexion AAROM;Right;10 reps    Flexion Limitations with wand to 120 deg      Shoulder Exercises: Prone   Other Prone Exercises R shoulder row to neutral 10x, extension to neutral 10x   cues to maintain shoulder in neutral and encourage scapular retraction     Shoulder Exercises: Standing   Other Standing Exercises R shoulder ABC's on ball on table  x2   shoulder <90 deg     Shoulder Exercises: Pulleys   Flexion 2 minutes    Flexion Limitations within protocol limits    Scaption 2 minutes    Scaption Limitations within protocol limits      Manual Therapy   Manual Therapy Joint mobilization;Passive ROM;Soft tissue mobilization    Joint Mobilization gentle R shoulder grade II/III PA, PA, inferior jt mobs    Passive ROM R shoulder flexion, scaption IR, ER PROM within pain-free motion according to protocol limits (flexion 120 deg, ABD 110 deg, ER scapular plane 45 deg, IR scapular plane 45 deg)                  PT Education - 05/04/20 0926    Education Details update to HEP- added in protocol/post op precautions and added in ball on table alphabet 2x through daily    Person(s) Educated Patient    Methods Explanation;Demonstration;Tactile cues;Verbal  cues;Handout    Comprehension Verbalized understanding;Returned demonstration            PT Short Term Goals - 04/17/20 1527      PT SHORT TERM GOAL #1   Title Patient to be independent with initial HEP.    Time 3    Period Weeks    Status Achieved    Target Date 05/03/20             PT Long Term Goals - 04/17/20 1527      PT LONG TERM GOAL #1   Title Patient to be independent with advanced HEP.    Time 8    Period Weeks    Status On-going      PT LONG TERM GOAL #2   Title Patient to demonstrate R UE strength >/=4+/5.    Time 8    Period Weeks    Status On-going      PT LONG TERM GOAL #3   Title Patient to demonstrate R shoulder AROM WFL and without pain limiting.    Time 8    Period Weeks    Status On-going      PT LONG TERM GOAL #4   Title Patient to demonstrate L UE overhead lift with 5lbs with good scapulohumeral rhythm.    Time 8    Period Weeks    Status On-going                 Plan - 05/04/20 9379    Clinical Impression Statement Patient arrived to session without new complaints. Worked on R shoulder joint mobs and PROM within new protocol limits which patient was able to reach today without restriction or pain. Initiated R shoulder flexion AAROM with patient demonstrating great ROM and tolerance. Patient also performed prone rowing/extension with excellent tolerance- mild correction of shoulder alignment and orientation required. Initiated pulleys for AAROM with good ROM demonstrated. Patient frequently requiring cues to decrease speed with most exercises performed today, however with great tolerance for all exercises. Updated HEP with exercises that were well-tolerated today- patient reported understanding and without complaints at end of session.    Comorbidities STEMI 2019, HLD, GERD, B knee arthroscopy, R TKA 2015    PT Treatment/Interventions ADLs/Self Care Home Management;Cryotherapy;Electrical Stimulation;Moist Heat;Therapeutic  exercise;Therapeutic activities;Functional mobility training;Ultrasound;Neuromuscular re-education;Patient/family education;Manual techniques;Vasopneumatic Device;Taping;Energy conservation;Dry needling;Passive range of motion;Scar mobilization    PT Next Visit Plan progress per protocol    Consulted and Agree with Plan of Care Patient  Patient will benefit from skilled therapeutic intervention in order to improve the following deficits and impairments:  Hypomobility,Increased edema,Decreased scar mobility,Decreased activity tolerance,Decreased strength,Pain,Impaired UE functional use,Increased fascial restricitons,Decreased knowledge of precautions,Increased muscle spasms,Improper body mechanics,Decreased range of motion,Impaired flexibility,Postural dysfunction  Visit Diagnosis: Acute pain of right shoulder  Stiffness of right shoulder, not elsewhere classified  Muscle weakness (generalized)  Abnormal posture     Problem List Patient Active Problem List   Diagnosis Date Noted  . Chest pain of uncertain etiology 54/65/6812  . Dyspnea 11/26/2017  . CAD S/P RI PCI 10/29/2017  . Essential hypertension 10/29/2017  . History of ST elevation myocardial infarction (STEMI) 10/19/2017  . 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 Rash and nonspecific skin eruption 04/30/2013  . Cellulitis of finger of left hand 04/30/2013  . Routine general medical examination at a health care facility 01/22/2013  . BPH (benign prostatic hypertrophy) 11/28/2012  . Need for prophylactic vaccination and inoculation against influenza 11/28/2012  . Bradycardia 10/01/2011  . Tobacco abuse 10/01/2011  . Diverticular disease   . Neck pain   . Dyslipidemia, goal LDL below 70   . Allergic rhinitis   . Allergic sinusitis   . GERD (gastroesophageal reflux disease)   . Seborrheic keratosis   . Osteoarthritis   . Erectile dysfunction   . Colon polyp      Janene Harvey, PT, DPT 05/04/20  9:29 AM   [facility] [address]  [place], [place], [address] Phone: [phone]   Fax:  [phone]  Name: TALLIN HART MRN: [id number] Date of Birth: 1950-06-02

## 2020-05-08 ENCOUNTER — Ambulatory Visit: Payer: Medicare Other | Admitting: Physical Therapy

## 2020-05-08 ENCOUNTER — Other Ambulatory Visit: Payer: Self-pay

## 2020-05-08 ENCOUNTER — Encounter: Payer: Self-pay | Admitting: Physical Therapy

## 2020-05-08 DIAGNOSIS — M6281 Muscle weakness (generalized): Secondary | ICD-10-CM

## 2020-05-08 DIAGNOSIS — M25511 Pain in right shoulder: Secondary | ICD-10-CM | POA: Diagnosis not present

## 2020-05-08 DIAGNOSIS — M25611 Stiffness of right shoulder, not elsewhere classified: Secondary | ICD-10-CM

## 2020-05-08 DIAGNOSIS — R293 Abnormal posture: Secondary | ICD-10-CM

## 2020-05-08 NOTE — Therapy (Signed)
[facility] [address]  [place], [place], [address] Phone: [phone]   Fax:  [phone]  Physical Therapy Treatment  Patient Details  Name: Alec Johnson MRN: [id number] Date of Birth: May 14, 1950 Referring Provider (PT): Sydnee Cabal, MD   Encounter Date: 05/08/2020   PT End of Session - 05/08/20 1023    Visit Number 6    Number of Visits 17    Date for PT Re-Evaluation 06/07/20    Authorization Type [employer] Medicare    PT Start Time 0925    PT Stop Time 1018    PT Time Calculation (min) 53 min    Activity Tolerance Patient tolerated treatment well    Behavior During Therapy [facility] for tasks assessed/performed           Past Medical History:  Diagnosis Date  . Colon polyp   . Diverticular disease   . Erectile dysfunction   . GERD (gastroesophageal reflux disease)    NO Recent problems  . Hyperlipidemia   . Osteoarthritis   . Seborrheic keratosis   . STEMI (ST elevation myocardial infarction) ([place])    10/19/17 PCI/DES to ramus with CTO of the mRCA with collaterals, normal EF    Past Surgical History:  Procedure Laterality Date  . athroscopic knee surgery     BIL KNEES  . CORONARY/GRAFT ACUTE MI REVASCULARIZATION N/A 10/19/2017   Procedure: Coronary/Graft Acute MI Revascularization;  Surgeon: Jettie Booze, MD;  Location: [facility];  Service: Cardiovascular;  Laterality: N/A;  . LEFT HEART CATH AND CORONARY ANGIOGRAPHY N/A 10/19/2017   Procedure: LEFT HEART CATH AND CORONARY ANGIOGRAPHY;  Surgeon: Jettie Booze, MD;  Location: [facility];  Service: Cardiovascular;  Laterality: N/A;  . LEG SURGERY  1966   PINNING DUE TO INJURY  . NOSE SURGERY    . TOTAL KNEE ARTHROPLASTY Right 01/18/2014   Procedure: RIGHT TOTAL KNEE ARTHROPLASTY;  Surgeon: Tobi Bastos, MD;  Location: [facility];  Service: Orthopedics;  Laterality: Right;    There were no vitals filed for this  visit.   Subjective Assessment - 05/08/20 0925    Subjective "The new exercises made me kind of sore." Pointing to front of shoulder. Relieved by ibuprofen. Denies questions or concerns on HEP.    Pertinent History STEMI 2019, HLD, GERD, B knee arthroscopy, R TKA 2015    Diagnostic tests none recent    Patient Stated Goals decrease pain/return to golf    Currently in Pain? Yes    Pain Score 1     Pain Location Shoulder    Pain Orientation Right    Pain Descriptors / Indicators Sore    Pain Type Acute pain;Surgical pain                             [facility] Adult PT Treatment/Exercise - 05/08/20 0001      Elbow Exercises   Other elbow exercises B triceps extensions with yellow TB 2x10 with shoulders in neutral with towel roll'      Shoulder Exercises: Supine   Protraction --    Protraction Limitations 2x10; AAROM with wand   good ROM; cues to decrease speed   Flexion AAROM;Right;10 reps    Flexion Limitations with wand to 120 deg      Shoulder Exercises: Prone   Other Prone Exercises leaning over counter R shoulder row to neutral 10x, extension to  neutral 10x   cues for scap squeeze     Shoulder Exercises: Standing   Other Standing Exercises R shoulder wall ladder flexion x5 to tolerance   cues to stop before shoulder hiking becomes evident   Other Standing Exercises R shoulder flexion/scaption rollouts with ball + perturbations x15 each      Shoulder Exercises: Pulleys   Flexion 3 minutes    Flexion Limitations within protocol limits   cues to limit ROM to protocol limits   Scaption 3 minutes    Scaption Limitations within protocol limits   cues to limit ROM to protocol limits     Shoulder Exercises: ROM/Strengthening   UBE (Upper Arm Bike) L1.0 2 min fwd/2 min back with R shoulder AAROM      Manual Therapy   Manual Therapy Joint mobilization;Passive ROM;Soft tissue mobilization    Manual therapy comments supine    Soft tissue mobilization STM to R pec,  proximal biceps tendon and muscle belly, deltoid- no TTP; increased soft tissue restriction in pec and biceps    Passive ROM R shoulder flexion, scaption IR, ER PROM within pain-free motion according to protocol limits (flexion 120 deg, ABD 110 deg, ER scapular plane 45 deg, IR scapular plane 45 deg)                  PT Education - 05/08/20 1023    Education Details update to HEP; administered yellow TB    Person(s) Educated Patient    Methods Explanation;Demonstration;Tactile cues;Verbal cues;Handout    Comprehension Verbalized understanding;Returned demonstration            PT Short Term Goals - 04/17/20 1527      PT SHORT TERM GOAL #1   Title Patient to be independent with initial HEP.    Time 3    Period Weeks    Status Achieved    Target Date 05/03/20             PT Long Term Goals - 04/17/20 1527      PT LONG TERM GOAL #1   Title Patient to be independent with advanced HEP.    Time 8    Period Weeks    Status On-going      PT LONG TERM GOAL #2   Title Patient to demonstrate R UE strength >/=4+/5.    Time 8    Period Weeks    Status On-going      PT LONG TERM GOAL #3   Title Patient to demonstrate R shoulder AROM WFL and without pain limiting.    Time 8    Period Weeks    Status On-going      PT LONG TERM GOAL #4   Title Patient to demonstrate L UE overhead lift with 5lbs with good scapulohumeral rhythm.    Time 8    Period Weeks    Status On-going                 Plan - 05/08/20 1024    Clinical Impression Statement Patient reporting R shoulder soreness after performing his new HEP which was resolved with Ibuprofen. Denies questions or concerns on HEP. Patient still tolerating shoulder PROM well and maintaining good ROM within protocol limits. Did demonstrate some soft tissue restriction in the R pec and biceps today but no TTP. Initiated serratus anterior strengthening with patient demonstrating great ROM- did require cues to decrease  eccentric speed. Patient was able to demonstrate good form and tolerance with exercises given  previously as part of HEP. Increased challenge with scapular stabilization, with patient performing this without complaints. Also able to tolerate triceps extension with light resistance without issue. Patient did demonstrate R shoulder elevation/hike with wall walks, thus opted for more limited ROM and focused on shoulder depression. Updated HEP with new exercises that were well-tolerated today. Patient reported understanding and without complaints at end of session. Patient is progressing well towards goals.    Comorbidities STEMI 2019, HLD, GERD, B knee arthroscopy, R TKA 2015    PT Treatment/Interventions ADLs/Self Care Home Management;Cryotherapy;Electrical Stimulation;Moist Heat;Therapeutic exercise;Therapeutic activities;Functional mobility training;Ultrasound;Neuromuscular re-education;Patient/family education;Manual techniques;Vasopneumatic Device;Taping;Energy conservation;Dry needling;Passive range of motion;Scar mobilization    PT Next Visit Plan progress per protocol    Consulted and Agree with Plan of Care Patient           Patient will benefit from skilled therapeutic intervention in order to improve the following deficits and impairments:  Hypomobility,Increased edema,Decreased scar mobility,Decreased activity tolerance,Decreased strength,Pain,Impaired UE functional use,Increased fascial restricitons,Decreased knowledge of precautions,Increased muscle spasms,Improper body mechanics,Decreased range of motion,Impaired flexibility,Postural dysfunction  Visit Diagnosis: Acute pain of right shoulder  Stiffness of right shoulder, not elsewhere classified  Muscle weakness (generalized)  Abnormal posture     Problem List Patient Active Problem List   Diagnosis Date Noted  . Chest pain of uncertain etiology 22/97/9892  . Dyspnea 11/26/2017  . CAD S/P RI PCI 10/29/2017  . Essential  hypertension 10/29/2017  . History of ST elevation myocardial infarction (STEMI) 10/19/2017  . 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 Rash and nonspecific skin eruption 04/30/2013  . Cellulitis of finger of left hand 04/30/2013  . Routine general medical examination at a health care facility 01/22/2013  . BPH (benign prostatic hypertrophy) 11/28/2012  . Need for prophylactic vaccination and inoculation against influenza 11/28/2012  . Bradycardia 10/01/2011  . Tobacco abuse 10/01/2011  . Diverticular disease   . Neck pain   . Dyslipidemia, goal LDL below 70   . Allergic rhinitis   . Allergic sinusitis   . GERD (gastroesophageal reflux disease)   . Seborrheic keratosis   . Osteoarthritis   . Erectile dysfunction   . Colon polyp     Janene Harvey, PT, DPT 05/08/20 10:28 AM   [facility] [address]  [place], [place], [address] Phone: [phone]   Fax:  [phone]  Name: LATIF NAZARENO MRN: [id number] Date of Birth: May 16, 1950

## 2020-05-10 ENCOUNTER — Ambulatory Visit: Payer: Medicare Other

## 2020-05-15 ENCOUNTER — Encounter: Payer: Self-pay | Admitting: Physical Therapy

## 2020-05-15 ENCOUNTER — Other Ambulatory Visit: Payer: Self-pay

## 2020-05-15 ENCOUNTER — Ambulatory Visit: Payer: Medicare Other | Admitting: Physical Therapy

## 2020-05-15 DIAGNOSIS — M25611 Stiffness of right shoulder, not elsewhere classified: Secondary | ICD-10-CM

## 2020-05-15 DIAGNOSIS — M25511 Pain in right shoulder: Secondary | ICD-10-CM | POA: Diagnosis not present

## 2020-05-15 DIAGNOSIS — M6281 Muscle weakness (generalized): Secondary | ICD-10-CM

## 2020-05-15 DIAGNOSIS — R293 Abnormal posture: Secondary | ICD-10-CM

## 2020-05-15 NOTE — Therapy (Signed)
[facility] [address]  [address] [place], [place], [address] Phone: [phone]   Fax:  [phone]  Physical Therapy Progress Note  Patient Details  Name: Alec Johnson MRN: [id number] Date of Birth: 18-Nov-1950 Referring Provider (PT): Eugenia Mcalpine, MD   Progress Note Reporting Period 04/12/20 to 05/15/20  See note below for Objective Data and Assessment of Progress/Goals.     Encounter Date: 05/15/2020   PT End of Session - 05/15/20 1125    Visit Number 7    Number of Visits 17    Date for PT Re-Evaluation 06/07/20    Authorization Type [employer] Medicare    PT Start Time 0926    PT Stop Time 1020    PT Time Calculation (min) 54 min    Activity Tolerance Patient tolerated treatment well;Patient limited by pain    Behavior During Therapy [facility] for tasks assessed/performed           Past Medical History:  Diagnosis Date  . Colon polyp   . Diverticular disease   . Erectile dysfunction   . GERD (gastroesophageal reflux disease)    NO Recent problems  . Hyperlipidemia   . Osteoarthritis   . Seborrheic keratosis   . STEMI (ST elevation myocardial infarction) ([facility])    10/19/17 PCI/DES to ramus with CTO of the mRCA with collaterals, normal EF    Past Surgical History:  Procedure Laterality Date  . athroscopic knee surgery     BIL KNEES  . CORONARY/GRAFT ACUTE MI REVASCULARIZATION N/A 10/19/2017   Procedure: Coronary/Graft Acute MI Revascularization;  Surgeon: Corky Crafts, MD;  Location: [facility];  Service: Cardiovascular;  Laterality: N/A;  . LEFT HEART CATH AND CORONARY ANGIOGRAPHY N/A 10/19/2017   Procedure: LEFT HEART CATH AND CORONARY ANGIOGRAPHY;  Surgeon: Corky Crafts, MD;  Location: [facility];  Service: Cardiovascular;  Laterality: N/A;  . LEG SURGERY  1966   PINNING DUE TO INJURY  . NOSE SURGERY    . TOTAL KNEE ARTHROPLASTY Right 01/18/2014   Procedure: RIGHT TOTAL KNEE  ARTHROPLASTY;  Surgeon: Jacki Cones, MD;  Location: [facility];  Service: Orthopedics;  Laterality: Right;    There were no vitals filed for this visit.   Subjective Assessment - 05/15/20 0926    Subjective Shoulder has been kind of sore in the R anterior and posterior aspect. Also reports pain in the bone of the R elbow- recalls having had shots in it for tennis elbow in the past. Notes no problem with HEP.    Pertinent History STEMI 2019, HLD, GERD, B knee arthroscopy, R TKA 2015    Diagnostic tests none recent    Patient Stated Goals decrease pain/return to golf    Currently in Pain? Yes    Pain Score 3     Pain Location Shoulder    Pain Orientation Right;Anterior;Posterior    Pain Descriptors / Indicators Sore    Pain Type Acute pain;Surgical pain    Multiple Pain Sites Yes    Pain Score 4    Pain Location Elbow    Pain Orientation Right;Lateral    Pain Descriptors / Indicators Aching    Pain Type Acute pain              [facility] PT Assessment - 05/15/20 0001      AROM   Right/Left Shoulder Right    Right Shoulder Flexion 120 Degrees   supine AAROM with wand  Right Shoulder ABduction 110 Degrees   scaption AAROM in supine with wand   Right Shoulder Internal Rotation 45 Degrees   supine AAROM with wand   Right Shoulder External Rotation 45 Degrees   supine AAROM with wand     PROM   Right Shoulder Flexion 120 Degrees    Right Shoulder ABduction 110 Degrees    Right Shoulder Internal Rotation 45 Degrees   scapular plane   Right Shoulder External Rotation 45 Degrees   scapular plane                        [facility] Adult PT Treatment/Exercise - 05/15/20 0929      Exercises   Exercises Wrist      Shoulder Exercises: Supine   Protraction Strengthening;Both;10 reps    Protraction Weight (lbs) 1    Protraction Limitations 2x10; good ROM    External Rotation Right;10 reps;PROM    External Rotation Limitations AAROM with wand, within protocol limits   cues  to avoid pushing inot pain   Flexion AAROM;Right;10 reps    ABduction AAROM;Right;10 reps    ABduction Limitations scaption within protocol limits    Other Supine Exercises R shoulder AAROM with wand in 45 deg recumbent position 5x flexion, 5x scaption   performed within protocol limits; c/o R shoulder pain with scaption- discontoinued     Shoulder Exercises: Pulleys   Flexion 3 minutes    Flexion Limitations within protocol limits    Scaption 3 minutes    Scaption Limitations within protocol limits      Wrist Exercises   Other wrist exercises R wrist flexion/extension stretch 30" each      Modalities   Modalities Vasopneumatic      Vasopneumatic   Number Minutes Vasopneumatic  10 minutes    Vasopnuematic Location  Shoulder   R   Vasopneumatic Pressure Low    Vasopneumatic Temperature  coldest      Manual Therapy   Manual Therapy Passive ROM    Manual therapy comments supine    Passive ROM R shoulder flexion, scaption IR, ER PROM within pain-free motion according to protocol limits (flexion 120 deg, ABD 110 deg, ER scapular plane 45 deg, IR scapular plane 45 deg)                  PT Education - 05/15/20 1122    Education Details discussion on objective progress; educated patient on use of wrist stretching and ice massage for 3-5 minutes while avoiding ulnar nerve for pain relief; educated patient on avoiding rounding shoulders and impingement positioning to avoid shoulder pain    Person(s) Educated Patient    Methods Explanation;Demonstration;Tactile cues;Verbal cues    Comprehension Verbalized understanding;Returned demonstration            PT Short Term Goals - 04/17/20 1527      PT SHORT TERM GOAL #1   Title Patient to be independent with initial HEP.    Time 3    Period Weeks    Status Achieved    Target Date 05/03/20             PT Long Term Goals - 05/15/20 1129      PT LONG TERM GOAL #1   Title Patient to be independent with advanced HEP.     Time 8    Period Weeks    Status Partially Met   met for current     PT LONG TERM GOAL #  2   Title Patient to demonstrate R UE strength >/=4+/5.    Time 8    Period Weeks    Status On-going   NT     PT LONG TERM GOAL #3   Title Patient to demonstrate R shoulder AROM WFL and without pain limiting.    Time 8    Period Weeks    Status On-going   able to perform PROM and supine AAROM within protocol limits     PT LONG TERM GOAL #4   Title Patient to demonstrate L UE overhead lift with 5lbs with good scapulohumeral rhythm.    Time 8    Period Weeks    Status On-going   NT                Plan - 05/15/20 1125    Clinical Impression Statement Patient arrived to session with report of soreness in the R anterior and posterior shoulder as well as pain over the R lateral epicondyle. Denies issues with his HEP. Upon palpation, patient TTP over R lateral epicondyle and proximal biceps tendon. Educated patient on wrist stretching and ice massage to address elbow pain and on posture and positioning to avoid shoulder pain as patient still with quite guarded and rounded posture even without sling donned. Patient is able to maintain pain-free PROM within protocol and today able to perform supine AAROM with good ROM today as well. Initiated reclined AAROM which was well-tolerated into flexion, but painful with scaption, thus this was discontinued. Able to perform serratus punches with light weight today with great ROM and tolerance, thus advised patient to perform this at home as well- patient reported understanding. Ended session with [place] to R shoulder for post-exercise soreness. No further complaints at end of session. Patient is progressing well towards goals.    Comorbidities STEMI 2019, HLD, GERD, B knee arthroscopy, R TKA 2015    PT Treatment/Interventions ADLs/Self Care Home Management;Cryotherapy;Electrical Stimulation;Moist Heat;Therapeutic exercise;Therapeutic activities;Functional  mobility training;Ultrasound;Neuromuscular re-education;Patient/family education;Manual techniques;Vasopneumatic Device;Taping;Energy conservation;Dry needling;Passive range of motion;Scar mobilization    PT Next Visit Plan progress per protocol    Consulted and Agree with Plan of Care Patient           Patient will benefit from skilled therapeutic intervention in order to improve the following deficits and impairments:  Hypomobility,Increased edema,Decreased scar mobility,Decreased activity tolerance,Decreased strength,Pain,Impaired UE functional use,Increased fascial restricitons,Decreased knowledge of precautions,Increased muscle spasms,Improper body mechanics,Decreased range of motion,Impaired flexibility,Postural dysfunction  Visit Diagnosis: Acute pain of right shoulder  Stiffness of right shoulder, not elsewhere classified  Muscle weakness (generalized)  Abnormal posture     Problem List Patient Active Problem List   Diagnosis Date Noted  . Chest pain of uncertain etiology 91/63/8466  . Dyspnea 11/26/2017  . CAD S/P RI PCI 10/29/2017  . Essential hypertension 10/29/2017  . History of ST elevation myocardial infarction (STEMI) 10/19/2017  . 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 Rash and nonspecific skin eruption 04/30/2013  . Cellulitis of finger of left hand 04/30/2013  . Routine general medical examination at a health care facility 01/22/2013  . BPH (benign prostatic hypertrophy) 11/28/2012  . Need for prophylactic vaccination and inoculation against influenza 11/28/2012  . Bradycardia 10/01/2011  . Tobacco abuse 10/01/2011  . Diverticular disease   . Neck pain   . Dyslipidemia, goal LDL below 70   . Allergic rhinitis   . Allergic sinusitis   . GERD (gastroesophageal reflux disease)   . Seborrheic keratosis   . Osteoarthritis   .  Erectile dysfunction   . Colon polyp     Janene Harvey, PT, DPT 05/15/20 11:33 AM   [facility] [address]  [place], [place], [address] Phone: [phone]   Fax:  [phone]  Name: Alec Johnson MRN: [id number] Date of Birth: May 26, 1950

## 2020-05-17 ENCOUNTER — Ambulatory Visit: Payer: Medicare Other

## 2020-05-17 ENCOUNTER — Other Ambulatory Visit: Payer: Self-pay

## 2020-05-17 DIAGNOSIS — M25611 Stiffness of right shoulder, not elsewhere classified: Secondary | ICD-10-CM

## 2020-05-17 DIAGNOSIS — M25511 Pain in right shoulder: Secondary | ICD-10-CM

## 2020-05-17 DIAGNOSIS — M6281 Muscle weakness (generalized): Secondary | ICD-10-CM

## 2020-05-17 DIAGNOSIS — R293 Abnormal posture: Secondary | ICD-10-CM

## 2020-05-17 NOTE — Therapy (Signed)
[facility] [address]  [place], [place], [address] Phone: [phone]   Fax:  [phone]  Physical Therapy Treatment  Patient Details  Name: Alec Johnson MRN: [id number] Date of Birth: 21-Dec-1950 Referring Provider (PT): Sydnee Cabal, MD   Encounter Date: 05/17/2020   PT End of Session - 05/17/20 1035    Visit Number 8    Number of Visits 17    Date for PT Re-Evaluation 06/07/20    Authorization Type UHC Medicare    PT Start Time 0933    PT Stop Time 1025    PT Time Calculation (min) 52 min    Activity Tolerance Patient tolerated treatment well;Patient limited by pain    Behavior During Therapy [facility] for tasks assessed/performed           Past Medical History:  Diagnosis Date  . Colon polyp   . Diverticular disease   . Erectile dysfunction   . GERD (gastroesophageal reflux disease)    NO Recent problems  . Hyperlipidemia   . Osteoarthritis   . Seborrheic keratosis   . STEMI (ST elevation myocardial infarction) ([place])    10/19/17 PCI/DES to ramus with CTO of the mRCA with collaterals, normal EF    Past Surgical History:  Procedure Laterality Date  . athroscopic knee surgery     BIL KNEES  . CORONARY/GRAFT ACUTE MI REVASCULARIZATION N/A 10/19/2017   Procedure: Coronary/Graft Acute MI Revascularization;  Surgeon: Jettie Booze, MD;  Location: [facility];  Service: Cardiovascular;  Laterality: N/A;  . LEFT HEART CATH AND CORONARY ANGIOGRAPHY N/A 10/19/2017   Procedure: LEFT HEART CATH AND CORONARY ANGIOGRAPHY;  Surgeon: Jettie Booze, MD;  Location: [facility];  Service: Cardiovascular;  Laterality: N/A;  . LEG SURGERY  1966   PINNING DUE TO INJURY  . NOSE SURGERY    . TOTAL KNEE ARTHROPLASTY Right 01/18/2014   Procedure: RIGHT TOTAL KNEE ARTHROPLASTY;  Surgeon: Tobi Bastos, MD;  Location: [facility];  Service: Orthopedics;  Laterality: Right;    There were no vitals  filed for this visit.   Subjective Assessment - 05/17/20 0936    Subjective Pt reports the doctor was pleased with progress, he is now out of sling.    Pertinent History STEMI 2019, HLD, GERD, B knee arthroscopy, R TKA 2015    Diagnostic tests none recent    Patient Stated Goals decrease pain/return to golf    Currently in Pain? Yes    Pain Score 1     Pain Location Shoulder    Pain Orientation Right;Anterior;Posterior    Pain Descriptors / Indicators Sore    Pain Type Acute pain;Surgical pain                             [facility] Adult PT Treatment/Exercise - 05/17/20 0001      Exercises   Exercises Shoulder;Wrist      Shoulder Exercises: Supine   Protraction Strengthening;Right;20 reps;Weights    Protraction Weight (lbs) 1    Protraction Limitations 2x10    External Rotation Right;10 reps;AAROM    External Rotation Limitations AAROM with wand    Flexion AROM;Right;10 reps      Shoulder Exercises: Seated   Flexion AROM;Right;10 reps    Flexion Limitations additional 10 reps with wand    Abduction AAROM;Right;Both;10 reps    ABduction Limitations with wand  Shoulder Exercises: Sidelying   External Rotation Right;10 reps;AROM    ABduction AROM;Right;10 reps      Shoulder Exercises: Pulleys   Flexion 3 minutes    Flexion Limitations within protocol limits    Scaption 3 minutes    Scaption Limitations within protocol limits      Shoulder Exercises: Therapy Ball   Flexion Both;10 reps    Flexion Limitations orange pball on wall    ABduction Both;10 reps    ABduction Limitations orange pball on wall      Wrist Exercises   Other wrist exercises R wrist flexion/extension stretch 30" each      Modalities   Modalities Vasopneumatic      Vasopneumatic   Number Minutes Vasopneumatic  10 minutes    Vasopnuematic Location  Shoulder    Vasopneumatic Pressure Low    Vasopneumatic Temperature  coldest      Manual Therapy   Manual Therapy Passive ROM     Manual therapy comments supine    Passive ROM R shld all motions within protocol                    PT Short Term Goals - 04/17/20 1527      PT SHORT TERM GOAL #1   Title Patient to be independent with initial HEP.    Time 3    Period Weeks    Status Achieved    Target Date 05/03/20             PT Long Term Goals - 05/15/20 1129      PT LONG TERM GOAL #1   Title Patient to be independent with advanced HEP.    Time 8    Period Weeks    Status Partially Met   met for current     PT LONG TERM GOAL #2   Title Patient to demonstrate R UE strength >/=4+/5.    Time 8    Period Weeks    Status On-going   NT     PT LONG TERM GOAL #3   Title Patient to demonstrate R shoulder AROM WFL and without pain limiting.    Time 8    Period Weeks    Status On-going   able to perform PROM and supine AAROM within protocol limits     PT LONG TERM GOAL #4   Title Patient to demonstrate L UE overhead lift with 5lbs with good scapulohumeral rhythm.    Time 8    Period Weeks    Status On-going   NT                Plan - 05/17/20 1035    Clinical Impression Statement Pt reported that everything went well with MD.  He is now out of the sling. The doctor has cleared him to progress ROM exercises and avoid bicep loading for an additional 2 weeks. Pt did ok with seated shoulder ROM exercises, he still shows mod muscle guarding and shoulder hiking with OH motions. All exercises were performed within ROM limitations as per protocol, pt was also advised to refrain from pushing into any pain. Good response during MT with pt remaining pain free. Ended session with GR to address swelling and soreness.    Personal Factors and Comorbidities Age;Comorbidity 3+;Fitness;Past/Current Experience;Time since onset of injury/illness/exacerbation    Comorbidities STEMI 2019, HLD, GERD, B knee arthroscopy, R TKA 2015    PT Frequency 2x / week    PT Duration 8 weeks  PT Treatment/Interventions  ADLs/Self Care Home Management;Cryotherapy;Electrical Stimulation;Moist Heat;Therapeutic exercise;Therapeutic activities;Functional mobility training;Ultrasound;Neuromuscular re-education;Patient/family education;Manual techniques;Vasopneumatic Device;Taping;Energy conservation;Dry needling;Passive range of motion;Scar mobilization    PT Next Visit Plan progress per protocol    Consulted and Agree with Plan of Care Patient           Patient will benefit from skilled therapeutic intervention in order to improve the following deficits and impairments:  Hypomobility,Increased edema,Decreased scar mobility,Decreased activity tolerance,Decreased strength,Pain,Impaired UE functional use,Increased fascial restricitons,Decreased knowledge of precautions,Increased muscle spasms,Improper body mechanics,Decreased range of motion,Impaired flexibility,Postural dysfunction  Visit Diagnosis: Acute pain of right shoulder  Stiffness of right shoulder, not elsewhere classified  Muscle weakness (generalized)  Abnormal posture     Problem List Patient Active Problem List   Diagnosis Date Noted  . Chest pain of uncertain etiology 29/24/4628  . Dyspnea 11/26/2017  . CAD S/P RI PCI 10/29/2017  . Essential hypertension 10/29/2017  . History of ST elevation myocardial infarction (STEMI) 10/19/2017  . 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 Rash and nonspecific skin eruption 04/30/2013  . Cellulitis of finger of left hand 04/30/2013  . Routine general medical examination at a health care facility 01/22/2013  . BPH (benign prostatic hypertrophy) 11/28/2012  . Need for prophylactic vaccination and inoculation against influenza 11/28/2012  . Bradycardia 10/01/2011  . Tobacco abuse 10/01/2011  . Diverticular disease   . Neck pain   . Dyslipidemia, goal LDL below 70   . Allergic rhinitis   . Allergic sinusitis   . GERD (gastroesophageal reflux disease)   . Seborrheic keratosis   . Osteoarthritis    . Erectile dysfunction   . Colon polyp     Artist Pais, PTA 05/17/2020, 10:45 AM  [facility] [address]  [place], [place], [address] Phone: [phone]   Fax:  [phone]  Name: Alec Johnson MRN: [id number] Date of Birth: Oct 14, 1950

## 2020-05-22 ENCOUNTER — Encounter: Payer: Self-pay | Admitting: Physical Therapy

## 2020-05-22 ENCOUNTER — Ambulatory Visit: Payer: Medicare Other | Attending: Specialist | Admitting: Physical Therapy

## 2020-05-22 ENCOUNTER — Other Ambulatory Visit: Payer: Self-pay

## 2020-05-22 DIAGNOSIS — M25611 Stiffness of right shoulder, not elsewhere classified: Secondary | ICD-10-CM | POA: Insufficient documentation

## 2020-05-22 DIAGNOSIS — M25511 Pain in right shoulder: Secondary | ICD-10-CM | POA: Insufficient documentation

## 2020-05-22 DIAGNOSIS — M6281 Muscle weakness (generalized): Secondary | ICD-10-CM | POA: Diagnosis present

## 2020-05-22 DIAGNOSIS — R293 Abnormal posture: Secondary | ICD-10-CM | POA: Insufficient documentation

## 2020-05-22 NOTE — Therapy (Signed)
[facility] [address]  [place], [place], [address] Phone: [phone]   Fax:  [phone]  Physical Therapy Treatment  Patient Details  Name: Alec Johnson MRN: [id number] Date of Birth: [birth year] Referring Provider (PT): Sydnee Cabal, MD   Encounter Date: 05/22/2020   PT End of Session - 05/22/20 1006    Visit Number 9    Number of Visits 17    Date for PT Re-Evaluation 06/07/20    Authorization Type UHC Medicare    PT Start Time 0922    PT Stop Time 1011    PT Time Calculation (min) 49 min    Activity Tolerance Patient tolerated treatment well    Behavior During Therapy [facility] for tasks assessed/performed           Past Medical History:  Diagnosis Date  . Colon polyp   . Diverticular disease   . Erectile dysfunction   . GERD (gastroesophageal reflux disease)    NO Recent problems  . Hyperlipidemia   . Osteoarthritis   . Seborrheic keratosis   . STEMI (ST elevation myocardial infarction) ([place])    10/19/17 PCI/DES to ramus with CTO of the mRCA with collaterals, normal EF    Past Surgical History:  Procedure Laterality Date  . athroscopic knee surgery     BIL KNEES  . CORONARY/GRAFT ACUTE MI REVASCULARIZATION N/A 10/19/2017   Procedure: Coronary/Graft Acute MI Revascularization;  Surgeon: Jettie Booze, MD;  Location: [facility];  Service: Cardiovascular;  Laterality: N/A;  . LEFT HEART CATH AND CORONARY ANGIOGRAPHY N/A 10/19/2017   Procedure: LEFT HEART CATH AND CORONARY ANGIOGRAPHY;  Surgeon: Jettie Booze, MD;  Location: [facility];  Service: Cardiovascular;  Laterality: N/A;  . LEG SURGERY  1966   PINNING DUE TO INJURY  . NOSE SURGERY    . TOTAL KNEE ARTHROPLASTY Right 01/18/2014   Procedure: RIGHT TOTAL KNEE ARTHROPLASTY;  Surgeon: Tobi Bastos, MD;  Location[facility];  Service: Orthopedics;  Laterality: Right;    There were no vitals filed for this visit.    Subjective Assessment - 05/22/20 0922    Subjective Bringing in HEP folder for review.    Pertinent History STEMI 2019, HLD, GERD, B knee arthroscopy, R TKA 2015    Diagnostic tests none recent    Patient Stated Goals decrease pain/return to golf    Currently in Pain? Yes    Pain Score 1     Pain Location Arm    Pain Descriptors / Indicators Sore    Pain Type Acute pain                             [facility] Adult PT Treatment/Exercise - 05/22/20 0001      Shoulder Exercises: Supine   Protraction Strengthening;Weights;Both;10 reps    Protraction Weight (lbs) 2    Protraction Limitations 2x10    Flexion AROM;Right;10 reps    Flexion Limitations to tolerance      Shoulder Exercises: Seated   External Rotation AAROM;Right;10 reps    External Rotation Limitations with wand, to tolerance   encouragement to maintain elbows neutral   Flexion AAROM;Left;10 reps    Flexion Limitations with wand, to tolerance   slight R shoulder hike- good ROM   Abduction AAROM;Right;Both;10 reps    ABduction Limitations scaption with wand, to tolerance   reporting "a good hurt"  Shoulder Exercises: Sidelying   ABduction AROM;Right;10 reps    ABduction Limitations thumb up; ROM to tolerance   good tolerance     Shoulder Exercises: Standing   Extension Strengthening;Both;10 reps;Theraband    Theraband Level (Shoulder Extension) Level 2 (Red)    Extension Limitations cueing for scap retraction    Row Strengthening;Both;10 reps;Theraband    Theraband Level (Shoulder Row) Level 2 (Red)    Row Limitations cueing for scap retraction      Shoulder Exercises: Pulleys   Flexion 3 minutes    Flexion Limitations to tolerance    Scaption 3 minutes    Scaption Limitations to tolerance      Vasopneumatic   Number Minutes Vasopneumatic  10 minutes    Vasopnuematic Location  Shoulder   R   Vasopneumatic Pressure Low    Vasopneumatic Temperature  coldest                  PT  Education - 05/22/20 1006    Education Details update/consolidation to [employer]) Educated Patient    Methods Explanation;Demonstration;Tactile cues;Verbal cues;Handout    Comprehension Verbalized understanding;Returned demonstration            PT Short Term Goals - 04/17/20 1527      PT SHORT TERM GOAL #1   Title Patient to be independent with initial HEP.    Time 3    Period Weeks    Status Achieved    Target Date 05/03/20             PT Long Term Goals - 05/15/20 1129      PT LONG TERM GOAL #1   Title Patient to be independent with advanced HEP.    Time 8    Period Weeks    Status Partially Met   met for current     PT LONG TERM GOAL #2   Title Patient to demonstrate R UE strength >/=4+/5.    Time 8    Period Weeks    Status On-going   NT     PT LONG TERM GOAL #3   Title Patient to demonstrate R shoulder AROM WFL and without pain limiting.    Time 8    Period Weeks    Status On-going   able to perform PROM and supine AAROM within protocol limits     PT LONG TERM GOAL #4   Title Patient to demonstrate L UE overhead lift with 5lbs with good scapulohumeral rhythm.    Time 8    Period Weeks    Status On-going   NT                Plan - 05/22/20 1006    Clinical Impression Statement Patient arrived to session with HEP folder for review. Per patient's new referral from orthopedic PA on 05/16/20- "no biceps loading for an additional 2 weeks, okay for progression of ROM." Reminded patient of these precautions- patient reported understanding. Worked on sitting R shoulder AAROM with patient requiring cueing to depress R shoulder. Most challenge demonstrated with scaption, however overall with good tolerance. Increased weighted resistance with serratus punches, which patient performed with great ROM. AROM in supine and sidelying was performed without pain and with good ROM. Updated and consolidated HEP with exercises that were well-tolerated today. Patient  reported understanding. Ended session with [employer] to R shoulder for post-exercise soreness. No complaints at end of session.    Personal Factors and Comorbidities Age;Comorbidity 3+;Fitness;Past/Current Experience;Time  since onset of injury/illness/exacerbation    Comorbidities STEMI 2019, HLD, GERD, B knee arthroscopy, R TKA 2015    PT Frequency 2x / week    PT Duration 8 weeks    PT Treatment/Interventions ADLs/Self Care Home Management;Cryotherapy;Electrical Stimulation;Moist Heat;Therapeutic exercise;Therapeutic activities;Functional mobility training;Ultrasound;Neuromuscular re-education;Patient/family education;Manual techniques;Vasopneumatic Device;Taping;Energy conservation;Dry needling;Passive range of motion;Scar mobilization    PT Next Visit Plan progress per protocol    Consulted and Agree with Plan of Care Patient           Patient will benefit from skilled therapeutic intervention in order to improve the following deficits and impairments:  Hypomobility,Increased edema,Decreased scar mobility,Decreased activity tolerance,Decreased strength,Pain,Impaired UE functional use,Increased fascial restricitons,Decreased knowledge of precautions,Increased muscle spasms,Improper body mechanics,Decreased range of motion,Impaired flexibility,Postural dysfunction  Visit Diagnosis: Acute pain of right shoulder  Stiffness of right shoulder, not elsewhere classified  Muscle weakness (generalized)  Abnormal posture     Problem List Patient Active Problem List   Diagnosis Date Noted  . Chest pain of uncertain etiology 27/61/8485  . Dyspnea 11/26/2017  . CAD S/P RI PCI 10/29/2017  . Essential hypertension 10/29/2017  . History of ST elevation myocardial infarction (STEMI) 10/19/2017  . 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 Rash and nonspecific skin eruption 04/30/2013  . Cellulitis of finger of left hand 04/30/2013  . Routine general medical examination at a health care  facility 01/22/2013  . BPH (benign prostatic hypertrophy) 11/28/2012  . Need for prophylactic vaccination and inoculation against influenza 11/28/2012  . Bradycardia 10/01/2011  . Tobacco abuse 10/01/2011  . Diverticular disease   . Neck pain   . Dyslipidemia, goal LDL below 70   . Allergic rhinitis   . Allergic sinusitis   . GERD (gastroesophageal reflux disease)   . Seborrheic keratosis   . Osteoarthritis   . Erectile dysfunction   . Colon polyp      Janene Harvey, PT, DPT 05/22/20 10:14 AM   [facility] [address]  [place], [place], [address] Phone: [phone]   Fax:  [phone]  Name: Alec Johnson MRN: [id number] Date of Birth: August 25, 1950

## 2020-05-25 ENCOUNTER — Other Ambulatory Visit: Payer: Self-pay

## 2020-05-25 ENCOUNTER — Ambulatory Visit: Payer: Medicare Other

## 2020-05-25 DIAGNOSIS — M25611 Stiffness of right shoulder, not elsewhere classified: Secondary | ICD-10-CM

## 2020-05-25 DIAGNOSIS — M25511 Pain in right shoulder: Secondary | ICD-10-CM

## 2020-05-25 DIAGNOSIS — M6281 Muscle weakness (generalized): Secondary | ICD-10-CM

## 2020-05-25 DIAGNOSIS — R293 Abnormal posture: Secondary | ICD-10-CM

## 2020-05-25 NOTE — Therapy (Signed)
[facility] [address]  [place], [place], [address] Phone: [phone]   Fax:  [phone]  Physical Therapy Treatment/Progress Note  Reporting Period 04/12/20 to 05/25/20  See note below for Objective Data and Assessment of Progress/Goals.       Patient Details  Name: Alec Johnson MRN: [id number] Date of Birth: 1950-04-09 Referring Provider (PT): Sydnee Cabal, MD   Encounter Date: 05/25/2020   PT End of Session - 05/25/20 0853    Visit Number 10    Number of Visits 17    Date for PT Re-Evaluation 06/07/20    Authorization Type [employer] Medicare    PT Start Time 6962    PT Stop Time 0937    PT Time Calculation (min) 50 min    Activity Tolerance Patient tolerated treatment well    Behavior During Therapy [facility] for tasks assessed/performed           Past Medical History:  Diagnosis Date  . Colon polyp   . Diverticular disease   . Erectile dysfunction   . GERD (gastroesophageal reflux disease)    NO Recent problems  . Hyperlipidemia   . Osteoarthritis   . Seborrheic keratosis   . STEMI (ST elevation myocardial infarction) ([place])    10/19/17 PCI/DES to ramus with CTO of the mRCA with collaterals, normal EF    Past Surgical History:  Procedure Laterality Date  . athroscopic knee surgery     BIL KNEES  . CORONARY/GRAFT ACUTE MI REVASCULARIZATION N/A 10/19/2017   Procedure: Coronary/Graft Acute MI Revascularization;  Surgeon: Jettie Booze, MD;  Location: [facility];  Service: Cardiovascular;  Laterality: N/A;  . LEFT HEART CATH AND CORONARY ANGIOGRAPHY N/A 10/19/2017   Procedure: LEFT HEART CATH AND CORONARY ANGIOGRAPHY;  Surgeon: Jettie Booze, MD;  Location: [facility];  Service: Cardiovascular;  Laterality: N/A;  . LEG SURGERY  1966   PINNING DUE TO INJURY  . NOSE SURGERY    . TOTAL KNEE ARTHROPLASTY Right 01/18/2014   Procedure: RIGHT TOTAL KNEE ARTHROPLASTY;  Surgeon:  Tobi Bastos, MD;  Location: [facility];  Service: Orthopedics;  Laterality: Right;    There were no vitals filed for this visit.   Subjective Assessment - 05/25/20 0853    Subjective Pt reports his shoulder feels pretty good. "This last week, I've started typing, writing, and eating with my right hand. As soon as I got out of the sling, I started using my hand and lower arm. I've been able to adjust my belt with my right hand too."    Pertinent History STEMI 2019, HLD, GERD, B knee arthroscopy, R TKA 2015    Diagnostic tests none recent    Patient Stated Goals decrease pain/return to golf    Currently in Pain? No/denies    Pain Score 0-No pain              [facility] PT Assessment - 05/25/20 0001      Observation/Other Assessments   Focus on Therapeutic Outcomes (FOTO)  Eval: 4% ability; today: 61% ability (59% predicted)      AROM   Right/Left Shoulder Right    Right Shoulder Flexion 140 Degrees   seated AAROM with wand   Right Shoulder ABduction 115 Degrees   seated AAROM with wand   Right Shoulder Internal Rotation 85 Degrees   seated AAROM with wand   Right Shoulder External Rotation 79 Degrees   seated AAROM with  wand     PROM   Right Shoulder Flexion 150 Degrees    Right Shoulder ABduction 120 Degrees   some pain at end range   Right Shoulder Internal Rotation 85 Degrees   45 abduction   Right Shoulder External Rotation 79 Degrees   45 abduction                        [facility] Adult PT Treatment/Exercise - 05/25/20 0001      Self-Care   Self-Care Other Self-Care Comments;Posture    Posture sternal lift to improve scapular positioning    Other Self-Care Comments  see pt edu, checking shoulder ROM and goals, as well as FOTO for PN      Shoulder Exercises: Supine   Protraction Strengthening;Weights;Both;10 reps    Protraction Weight (lbs) 2    Protraction Limitations 2x10    Flexion AROM;Right;10 reps    Flexion Limitations with cane to tolerance       Shoulder Exercises: Seated   Flexion AAROM;Left;10 reps    Flexion Limitations with wand, to tolerance   slight R shoulder hike- good ROM   Abduction AAROM;Right;Both;10 reps    ABduction Limitations scaption with wand, to tolerance   reporting "a good hurt"     Shoulder Exercises: Sidelying   ABduction AROM;Right;10 reps   2 sets   ABduction Limitations thumb up; ROM to tolerance   good tolerance   Other Sidelying Exercises HBH R open books   lumbar lock     Shoulder Exercises: Standing   Extension Strengthening;Both;10 reps;Theraband    Theraband Level (Shoulder Extension) Level 2 (Red)    Extension Limitations verbal/tactile cues for extended elbows, scap depression and retraction    Row Strengthening;Both;10 reps;Theraband    Theraband Level (Shoulder Row) Level 2 (Red)    Row Limitations max verbal/tactile cues for scapular depression/retraction, sternal lift      Shoulder Exercises: Pulleys   Flexion 3 minutes    Flexion Limitations to tolerance    Scaption 3 minutes    Scaption Limitations to tolerance                  PT Education - 05/25/20 0955    Education Details form for rows and straight arm pulldowns at home    Person(s) Educated Patient    Methods Explanation;Demonstration;Tactile cues;Verbal cues    Comprehension Verbalized understanding;Returned demonstration;Verbal cues required;Tactile cues required            PT Short Term Goals - 04/17/20 1527      PT SHORT TERM GOAL #1   Title Patient to be independent with initial HEP.    Time 3    Period Weeks    Status Achieved    Target Date 05/03/20             PT Long Term Goals - 05/25/20 0939      PT LONG TERM GOAL #1   Title Patient to be independent with advanced HEP.    Time 8    Period Weeks    Status Partially Met   met for current     PT LONG TERM GOAL #2   Title Patient to demonstrate R UE strength >/=4+/5.    Time 8    Period Weeks    Status On-going   NT     PT LONG TERM  GOAL #3   Title Patient to demonstrate R shoulder AROM WFL and without pain limiting.  Time 8    Period Weeks    Status On-going   able to perform PROM and supine AAROM within protocol limits     PT LONG TERM GOAL #4   Title Patient to demonstrate L UE overhead lift with 5lbs with good scapulohumeral rhythm.    Time 8    Period Weeks    Status On-going   NT                Plan - 05/25/20 4008    Clinical Impression Statement Pt is about 7 weeks out from R RCR, in the AAROM phase. He is following his protocol and performing HEP diligently, and he continues to have no pain. He has met STG's and is making good progress toward LTG's, but still unable to test R UE MMT and OH lifting. Pt demonstrates R shoulder hike with seated AAROM flexion, scaption, and most with abduction + wand that decreases some with mirror feedback and manual assist for improved scapulohumeral rhythm. He requires max cueing to perform rows with anterior glenohumeral translation and appropriate scapular depression and retraction. Progessed sidelying abduction AROM endurance with an extra set, noting pt fatigue through concentric and eccentric component. Ended session with [employer] to R shoulder for post-exercise soreness. No complaints at end of session.    Personal Factors and Comorbidities Age;Comorbidity 3+;Fitness;Past/Current Experience;Time since onset of injury/illness/exacerbation    Comorbidities STEMI 2019, HLD, GERD, B knee arthroscopy, R TKA 2015    PT Frequency 2x / week    PT Duration 8 weeks    PT Treatment/Interventions ADLs/Self Care Home Management;Cryotherapy;Electrical Stimulation;Moist Heat;Therapeutic exercise;Therapeutic activities;Functional mobility training;Ultrasound;Neuromuscular re-education;Patient/family education;Manual techniques;Vasopneumatic Device;Taping;Energy conservation;Dry needling;Passive range of motion;Scar mobilization    PT Next Visit Plan progress per protocol     Consulted and Agree with Plan of Care Patient           Patient will benefit from skilled therapeutic intervention in order to improve the following deficits and impairments:  Hypomobility,Increased edema,Decreased scar mobility,Decreased activity tolerance,Decreased strength,Pain,Impaired UE functional use,Increased fascial restricitons,Decreased knowledge of precautions,Increased muscle spasms,Improper body mechanics,Decreased range of motion,Impaired flexibility,Postural dysfunction  Visit Diagnosis: Acute pain of right shoulder  Stiffness of right shoulder, not elsewhere classified  Muscle weakness (generalized)  Abnormal posture     Problem List Patient Active Problem List   Diagnosis Date Noted  . Chest pain of uncertain etiology 67/61/9509  . Dyspnea 11/26/2017  . CAD S/P RI PCI 10/29/2017  . Essential hypertension 10/29/2017  . History of ST elevation myocardial infarction (STEMI) 10/19/2017  . 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 Rash and nonspecific skin eruption 04/30/2013  . Cellulitis of finger of left hand 04/30/2013  . Routine general medical examination at a health care facility 01/22/2013  . BPH (benign prostatic hypertrophy) 11/28/2012  . Need for prophylactic vaccination and inoculation against influenza 11/28/2012  . Bradycardia 10/01/2011  . Tobacco abuse 10/01/2011  . Diverticular disease   . Neck pain   . Dyslipidemia, goal LDL below 70   . Allergic rhinitis   . Allergic sinusitis   . GERD (gastroesophageal reflux disease)   . Seborrheic keratosis   . Osteoarthritis   . Erectile dysfunction   . Colon polyp     Izell Afton, PT, DPT 05/25/2020, 10:08 AM  [facility] [address]  [place], [place], [address] Phone: [phone]   Fax:  [phone]  Name: OLUWADEMILADE MCKIVER MRN: [id number] Date of Birth: 31-Jul-1950

## 2020-05-29 ENCOUNTER — Other Ambulatory Visit: Payer: Self-pay

## 2020-05-29 ENCOUNTER — Ambulatory Visit: Payer: Medicare Other

## 2020-05-29 DIAGNOSIS — M25511 Pain in right shoulder: Secondary | ICD-10-CM | POA: Diagnosis not present

## 2020-05-29 DIAGNOSIS — M6281 Muscle weakness (generalized): Secondary | ICD-10-CM

## 2020-05-29 DIAGNOSIS — M25611 Stiffness of right shoulder, not elsewhere classified: Secondary | ICD-10-CM

## 2020-05-29 DIAGNOSIS — R293 Abnormal posture: Secondary | ICD-10-CM

## 2020-05-29 NOTE — Therapy (Signed)
[facility] [address]  [place], [place], [address] Phone: [phone]   Fax:  [phone]  Physical Therapy Treatment  Patient Details  Name: Alec Johnson MRN: [id number] Date of Birth: 03-13-1950 Referring Provider (PT): Sydnee Cabal, MD   Encounter Date: 05/29/2020   PT End of Session - 05/29/20 0935    Visit Number 11    Number of Visits 17    Date for PT Re-Evaluation 06/07/20    Authorization Type [employer] Medicare    PT Start Time 0932    PT Stop Time 1023    PT Time Calculation (min) 51 min    Activity Tolerance Patient tolerated treatment well    Behavior During Therapy [facility] for tasks assessed/performed           Past Medical History:  Diagnosis Date  . Colon polyp   . Diverticular disease   . Erectile dysfunction   . GERD (gastroesophageal reflux disease)    NO Recent problems  . Hyperlipidemia   . Osteoarthritis   . Seborrheic keratosis   . STEMI (ST elevation myocardial infarction) ([place])    10/19/17 PCI/DES to ramus with CTO of the mRCA with collaterals, normal EF    Past Surgical History:  Procedure Laterality Date  . athroscopic knee surgery     BIL KNEES  . CORONARY/GRAFT ACUTE MI REVASCULARIZATION N/A 10/19/2017   Procedure: Coronary/Graft Acute MI Revascularization;  Surgeon: Jettie Booze, MD;  Location: [facility];  Service: Cardiovascular;  Laterality: N/A;  . LEFT HEART CATH AND CORONARY ANGIOGRAPHY N/A 10/19/2017   Procedure: LEFT HEART CATH AND CORONARY ANGIOGRAPHY;  Surgeon: Jettie Booze, MD;  Location: [facility];  Service: Cardiovascular;  Laterality: N/A;  . LEG SURGERY  1966   PINNING DUE TO INJURY  . NOSE SURGERY    . TOTAL KNEE ARTHROPLASTY Right 01/18/2014   Procedure: RIGHT TOTAL KNEE ARTHROPLASTY;  Surgeon: Tobi Bastos, MD;  Location: [facility];  Service: Orthopedics;  Laterality: Right;    There were no vitals filed for this  visit.   Subjective Assessment - 05/29/20 0936    Subjective Pt reports he continues to type, write, and eat with right hand, but used the left hand for the mouse because his right was hurting. He had some medial deltoid pain that woke him up this morning 2am and took 2 Tylenol. He finds the sidelying abduction and AAROM flexion painful. He did sidelying Friday night and lost control with arm falling overhead, which is hwat he thinks hurt his shoulder.    Pertinent History STEMI 2019, HLD, GERD, B knee arthroscopy, R TKA 2015    Diagnostic tests none recent    Patient Stated Goals decrease pain/return to golf    Currently in Pain? Yes    Pain Score 2     Pain Location Shoulder    Pain Orientation Right;Anterior;Lateral    Pain Descriptors / Indicators Sore                             [facility] Adult PT Treatment/Exercise - 05/29/20 0001      Self-Care   Self-Care Heat/Ice Application;Other Self-Care Comments    Heat/Ice Application Ice for pain    Other Self-Care Comments  see pt edu      Shoulder Exercises: Supine   Protraction Strengthening;Weights;Both;10 reps    Protraction Weight (lbs) 2  Protraction Limitations 2x15      Shoulder Exercises: Sidelying   ABduction AAROM;Right;12 reps    ABduction Limitations with downward pressure toward hip for reciprocal inhibition   RI eliminated pain around greater tubercle   Other Sidelying Exercises HBH R open books   lumbar lock     Shoulder Exercises: Pulleys   Flexion 3 minutes    Flexion Limitations to tolerance    Scaption 3 minutes    Scaption Limitations to tolerance      Manual Therapy   Manual Therapy Joint mobilization;Soft tissue mobilization;Myofascial release;Passive ROM    Joint Mobilization inf/post GHJ glides grade 3    Soft tissue mobilization STM/DTM to pecs, UT, post cuff    Myofascial Release XFM to posterior cuff and subscap    Passive ROM all planes, stretching into flexion                   PT Education - 05/29/20 1152    Education Details edu for backing off of anything painful right now, i.e. forcing AAROM flexion or going too far into sidelying abduction (advised to shorten ROM and cut back if painful); edu on DCR and how that can increase irritation with OH reaching for awhile --> therefore, don't force flexion, ice as needed for pain    Person(s) Educated Patient    Methods Explanation;Demonstration;Tactile cues;Verbal cues    Comprehension Verbalized understanding;Returned demonstration;Verbal cues required;Tactile cues required            PT Short Term Goals - 04/17/20 1527      PT SHORT TERM GOAL #1   Title Patient to be independent with initial HEP.    Time 3    Period Weeks    Status Achieved    Target Date 05/03/20             PT Long Term Goals - 05/25/20 0939      PT LONG TERM GOAL #1   Title Patient to be independent with advanced HEP.    Time 8    Period Weeks    Status Partially Met   met for current     PT LONG TERM GOAL #2   Title Patient to demonstrate R UE strength >/=4+/5.    Time 8    Period Weeks    Status On-going   NT     PT LONG TERM GOAL #3   Title Patient to demonstrate R shoulder AROM WFL and without pain limiting.    Time 8    Period Weeks    Status On-going   able to perform PROM and supine AAROM within protocol limits     PT LONG TERM GOAL #4   Title Patient to demonstrate L UE overhead lift with 5lbs with good scapulohumeral rhythm.    Time 8    Period Weeks    Status On-going   NT                Plan - 05/29/20 0935    Clinical Impression Statement Pt presents >7 weeks out from R SAD, DCR, and RCR. He admittedly overdid his exercises and was unable to control a rep of sidelying abduction with his arm falling OH. After that happened on Friday evening, pt had pain in his shoulder in medial deltoid/greater tubercle region that he was able to decrease with pinpoint pressure. Pt additionally  reported increased pain with AAROM forced flexion for stretching. Educated pt extensively on backing off of those exercises to  no further aggravate his shoulder and to reduce ROM given irritability. Pt tolerated tx well today with increased focus on manual therapy, addressing trigger points in posterior cuff and supraspinatus, as well as subscap release. Pt able to perform sidelying AAROM (mostly AROM through concentric) abduction to 90 with reciprocal inhibition through eccentric to abolish pain.    Personal Factors and Comorbidities Age;Comorbidity 3+;Fitness;Past/Current Experience;Time since onset of injury/illness/exacerbation    Comorbidities STEMI 2019, HLD, GERD, B knee arthroscopy, R TKA 2015    PT Frequency 2x / week    PT Duration 8 weeks    PT Treatment/Interventions ADLs/Self Care Home Management;Cryotherapy;Electrical Stimulation;Moist Heat;Therapeutic exercise;Therapeutic activities;Functional mobility training;Ultrasound;Neuromuscular re-education;Patient/family education;Manual techniques;Vasopneumatic Device;Taping;Energy conservation;Dry needling;Passive range of motion;Scar mobilization    PT Next Visit Plan progress per protocol    Consulted and Agree with Plan of Care Patient           Patient will benefit from skilled therapeutic intervention in order to improve the following deficits and impairments:  Hypomobility,Increased edema,Decreased scar mobility,Decreased activity tolerance,Decreased strength,Pain,Impaired UE functional use,Increased fascial restricitons,Decreased knowledge of precautions,Increased muscle spasms,Improper body mechanics,Decreased range of motion,Impaired flexibility,Postural dysfunction  Visit Diagnosis: Acute pain of right shoulder  Stiffness of right shoulder, not elsewhere classified  Muscle weakness (generalized)  Abnormal posture     Problem List Patient Active Problem List   Diagnosis Date Noted  . Chest pain of uncertain etiology  11/13/8525  . Dyspnea 11/26/2017  . CAD S/P RI PCI 10/29/2017  . Essential hypertension 10/29/2017  . History of ST elevation myocardial infarction (STEMI) 10/19/2017  . 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 Rash and nonspecific skin eruption 04/30/2013  . Cellulitis of finger of left hand 04/30/2013  . Routine general medical examination at a health care facility 01/22/2013  . BPH (benign prostatic hypertrophy) 11/28/2012  . Need for prophylactic vaccination and inoculation against influenza 11/28/2012  . Bradycardia 10/01/2011  . Tobacco abuse 10/01/2011  . Diverticular disease   . Neck pain   . Dyslipidemia, goal LDL below 70   . Allergic rhinitis   . Allergic sinusitis   . GERD (gastroesophageal reflux disease)   . Seborrheic keratosis   . Osteoarthritis   . Erectile dysfunction   . Colon polyp     Izell [place], PT, DPT 05/29/2020, 11:57 AM  [facility] [address]  [place], [place], [address] Phone: [phone]   Fax:  [phone]  Name: Alec Johnson MRN: [id number] Date of Birth: 1950-08-17

## 2020-05-31 ENCOUNTER — Ambulatory Visit: Payer: Medicare Other | Admitting: Physical Therapy

## 2020-05-31 ENCOUNTER — Encounter: Payer: Self-pay | Admitting: Physical Therapy

## 2020-05-31 ENCOUNTER — Other Ambulatory Visit: Payer: Self-pay

## 2020-05-31 DIAGNOSIS — M6281 Muscle weakness (generalized): Secondary | ICD-10-CM

## 2020-05-31 DIAGNOSIS — M25511 Pain in right shoulder: Secondary | ICD-10-CM | POA: Diagnosis not present

## 2020-05-31 DIAGNOSIS — R293 Abnormal posture: Secondary | ICD-10-CM

## 2020-05-31 DIAGNOSIS — M25611 Stiffness of right shoulder, not elsewhere classified: Secondary | ICD-10-CM

## 2020-05-31 NOTE — Therapy (Signed)
[facility] [address]  [place], [place], [address] Phone: [phone]   Fax:  [phone]  Physical Therapy Treatment  Patient Details  Name: Alec Johnson MRN: [id number] Date of Birth: 06/10/1950 Referring Provider (PT): Sydnee Cabal, MD   Encounter Date: 05/31/2020   PT End of Session - 05/31/20 0932    Visit Number 12    Number of Visits 17    Date for PT Re-Evaluation 06/07/20    Authorization Type [employer] Medicare    PT Start Time 0842    PT Stop Time 0936    PT Time Calculation (min) 54 min    Activity Tolerance Patient tolerated treatment well    Behavior During Therapy [facility] for tasks assessed/performed           Past Medical History:  Diagnosis Date  . Colon polyp   . Diverticular disease   . Erectile dysfunction   . GERD (gastroesophageal reflux disease)    NO Recent problems  . Hyperlipidemia   . Osteoarthritis   . Seborrheic keratosis   . STEMI (ST elevation myocardial infarction) ([place])    10/19/17 PCI/DES to ramus with CTO of the mRCA with collaterals, normal EF    Past Surgical History:  Procedure Laterality Date  . athroscopic knee surgery     BIL KNEES  . CORONARY/GRAFT ACUTE MI REVASCULARIZATION N/A 10/19/2017   Procedure: Coronary/Graft Acute MI Revascularization;  Surgeon: Jettie Booze, MD;  Location: [facility];  Service: Cardiovascular;  Laterality: N/A;  . LEFT HEART CATH AND CORONARY ANGIOGRAPHY N/A 10/19/2017   Procedure: LEFT HEART CATH AND CORONARY ANGIOGRAPHY;  Surgeon: Jettie Booze, MD;  Location: [facility];  Service: Cardiovascular;  Laterality: N/A;  . LEG SURGERY  1966   PINNING DUE TO INJURY  . NOSE SURGERY    . TOTAL KNEE ARTHROPLASTY Right 01/18/2014   Procedure: RIGHT TOTAL KNEE ARTHROPLASTY;  Surgeon: Tobi Bastos, MD;  Location: [facility];  Service: Orthopedics;  Laterality: Right;    There were no vitals filed for this  visit.   Subjective Assessment - 05/31/20 0844    Subjective R arm is still sore- has avoided performing abduction AROM and AAROM. Had some pain when trying to pull a weed out of the ground with the R arm.    Pertinent History STEMI 2019, HLD, GERD, B knee arthroscopy, R TKA 2015    Diagnostic tests none recent    Patient Stated Goals decrease pain/return to golf    Currently in Pain? Yes    Pain Score 3     Pain Location Shoulder    Pain Orientation Right;Anterior;Lateral    Pain Descriptors / Indicators Sore    Pain Type Acute pain                             [facility] Adult PT Treatment/Exercise - 05/31/20 0001      Shoulder Exercises: Supine   Protraction Strengthening;Weights;Both;10 reps    Protraction Weight (lbs) 3    Protraction Limitations 2x10   cues to increase control on descent   Flexion AROM;Right;5 reps    Flexion Limitations 2x5   c/o popping in the anterior shoulder     Shoulder Exercises: Seated   Flexion AAROM;AROM;Right;5 reps    Flexion Limitations 5x with wand, 5x without, 5x scaption without wand   no pain or crepitus; cueing to depress  R shoulder   Abduction AAROM;Right;10 reps    ABduction Limitations with wand   ROM to ~100 deg; cues to depress shoulder     Shoulder Exercises: Sidelying   External Rotation AROM;Right;10 reps    External Rotation Limitations 2x10; shoulder in neutral; ROM to tolerance    ABduction Right;10 reps;AROM    ABduction Limitations thumb up   1/10 pain; palpable and audible crepitus     Shoulder Exercises: ROM/Strengthening   UBE (Upper Arm Bike) L1.0 3 min fwd/3 min back with R shoulder AAROM      Vasopneumatic   Number Minutes Vasopneumatic  10 minutes    Vasopnuematic Location  Shoulder   R   Vasopneumatic Pressure Low    Vasopneumatic Temperature  coldest      Manual Therapy   Manual Therapy Soft tissue mobilization;Myofascial release    Manual therapy comments supine    Soft tissue mobilization  STM/DTM to R  lateral and posterior deltoid    Myofascial Release manual TPR to  R  lateral and posterior deltoid   very TTP and palpable trigger pt                 PT Education - 05/31/20 0931    Education Details update to HEP- advised patient to perform exercises within tolerable and well-controlled HEP and to ease off if pain occurs    Person(s) Educated Patient    Methods Explanation;Demonstration;Tactile cues;Verbal cues;Handout    Comprehension Verbalized understanding;Returned demonstration            PT Short Term Goals - 04/17/20 1527      PT SHORT TERM GOAL #1   Title Patient to be independent with initial HEP.    Time 3    Period Weeks    Status Achieved    Target Date 05/03/20             PT Long Term Goals - 05/25/20 0939      PT LONG TERM GOAL #1   Title Patient to be independent with advanced HEP.    Time 8    Period Weeks    Status Partially Met   met for current     PT LONG TERM GOAL #2   Title Patient to demonstrate R UE strength >/=4+/5.    Time 8    Period Weeks    Status On-going   NT     PT LONG TERM GOAL #3   Title Patient to demonstrate R shoulder AROM WFL and without pain limiting.    Time 8    Period Weeks    Status On-going   able to perform PROM and supine AAROM within protocol limits     PT LONG TERM GOAL #4   Title Patient to demonstrate L UE overhead lift with 5lbs with good scapulohumeral rhythm.    Time 8    Period Weeks    Status On-going   NT                Plan - 05/31/20 0932    Clinical Impression Statement Patient arrived to session with report of remaining R shoulder soreness since his recent flare. Notes that he has avoided abduction AAROM and AROM. Addressed pain with MT to R lateral and posterior deltoid- patient with palpable and tender trigger point in this region. Worked on supine AROM flexion with which patient reported a "popping" in his anterior shoulder which was palpable- notes that this was  not present before  his arm fell overhead last weekend. Able to perform sidelying abduction with good tolerance but still with palpable and audible crepitus. Overall patient fairly pain-free with mat ther-ex today. Sitting AAROM and AROM was performed with cueing to depress R shoulder d/t tendency for slight shoulder hike. Ended session with [place] to R shoulder for post-exercise soreness. Patient tolerated session well despite recent flare.    Personal Factors and Comorbidities Age;Comorbidity 3+;Fitness;Past/Current Experience;Time since onset of injury/illness/exacerbation    Comorbidities STEMI 2019, HLD, GERD, B knee arthroscopy, R TKA 2015    PT Frequency 2x / week    PT Duration 8 weeks    PT Treatment/Interventions ADLs/Self Care Home Management;Cryotherapy;Electrical Stimulation;Moist Heat;Therapeutic exercise;Therapeutic activities;Functional mobility training;Ultrasound;Neuromuscular re-education;Patient/family education;Manual techniques;Vasopneumatic Device;Taping;Energy conservation;Dry needling;Passive range of motion;Scar mobilization    PT Next Visit Plan progress per protocol    Consulted and Agree with Plan of Care Patient           Patient will benefit from skilled therapeutic intervention in order to improve the following deficits and impairments:  Hypomobility,Increased edema,Decreased scar mobility,Decreased activity tolerance,Decreased strength,Pain,Impaired UE functional use,Increased fascial restricitons,Decreased knowledge of precautions,Increased muscle spasms,Improper body mechanics,Decreased range of motion,Impaired flexibility,Postural dysfunction  Visit Diagnosis: Acute pain of right shoulder  Stiffness of right shoulder, not elsewhere classified  Muscle weakness (generalized)  Abnormal posture     Problem List Patient Active Problem List   Diagnosis Date Noted  . Chest pain of uncertain etiology 49/70/2637  . Dyspnea 11/26/2017  . CAD S/P RI PCI  10/29/2017  . Essential hypertension 10/29/2017  . History of ST elevation myocardial infarction (STEMI) 10/19/2017  . 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 Rash and nonspecific skin eruption 04/30/2013  . Cellulitis of finger of left hand 04/30/2013  . Routine general medical examination at a health care facility 01/22/2013  . BPH (benign prostatic hypertrophy) 11/28/2012  . Need for prophylactic vaccination and inoculation against influenza 11/28/2012  . Bradycardia 10/01/2011  . Tobacco abuse 10/01/2011  . Diverticular disease   . Neck pain   . Dyslipidemia, goal LDL below 70   . Allergic rhinitis   . Allergic sinusitis   . GERD (gastroesophageal reflux disease)   . Seborrheic keratosis   . Osteoarthritis   . Erectile dysfunction   . Colon polyp      Janene Harvey, PT, DPT 05/31/20 9:38 AM   [facility] [address]  [place], [place], [address] Phone: [phone]   Fax:  [phone]  Name: Alec Johnson MRN: [id number] Date of Birth: [birth year]

## 2020-06-05 ENCOUNTER — Other Ambulatory Visit: Payer: Self-pay

## 2020-06-05 ENCOUNTER — Ambulatory Visit: Payer: Medicare Other | Admitting: Physical Therapy

## 2020-06-05 ENCOUNTER — Encounter: Payer: Self-pay | Admitting: Physical Therapy

## 2020-06-05 DIAGNOSIS — R293 Abnormal posture: Secondary | ICD-10-CM

## 2020-06-05 DIAGNOSIS — M25611 Stiffness of right shoulder, not elsewhere classified: Secondary | ICD-10-CM

## 2020-06-05 DIAGNOSIS — M6281 Muscle weakness (generalized): Secondary | ICD-10-CM

## 2020-06-05 DIAGNOSIS — M25511 Pain in right shoulder: Secondary | ICD-10-CM | POA: Diagnosis not present

## 2020-06-05 NOTE — Therapy (Signed)
[facility] [address]  [place], [place], [address] Phone: [phone]   Fax:  [phone]  Physical Therapy Treatment  Patient Details  Name: Alec Johnson MRN: [id number] Date of Birth: May 15, 1950 Referring Provider (PT): Sydnee Cabal, MD   Encounter Date: 06/05/2020   PT End of Session - 06/05/20 1017    Visit Number 13    Number of Visits 17    Date for PT Re-Evaluation 06/07/20    Authorization Type UHC Medicare    PT Start Time 0933    PT Stop Time 1014    PT Time Calculation (min) 41 min    Activity Tolerance Patient tolerated treatment well    Behavior During Therapy [facility] for tasks assessed/performed           Past Medical History:  Diagnosis Date  . Colon polyp   . Diverticular disease   . Erectile dysfunction   . GERD (gastroesophageal reflux disease)    NO Recent problems  . Hyperlipidemia   . Osteoarthritis   . Seborrheic keratosis   . STEMI (ST elevation myocardial infarction) ([place])    10/19/17 PCI/DES to ramus with CTO of the mRCA with collaterals, normal EF    Past Surgical History:  Procedure Laterality Date  . athroscopic knee surgery     BIL KNEES  . CORONARY/GRAFT ACUTE MI REVASCULARIZATION N/A 10/19/2017   Procedure: Coronary/Graft Acute MI Revascularization;  Surgeon: Jettie Booze, MD;  Location: [facility];  Service: Cardiovascular;  Laterality: N/A;  . LEFT HEART CATH AND CORONARY ANGIOGRAPHY N/A 10/19/2017   Procedure: LEFT HEART CATH AND CORONARY ANGIOGRAPHY;  Surgeon: Jettie Booze, MD;  Location: [facility];  Service: Cardiovascular;  Laterality: N/A;  . LEG SURGERY  1966   PINNING DUE TO INJURY  . NOSE SURGERY    . TOTAL KNEE ARTHROPLASTY Right 01/18/2014   Procedure: RIGHT TOTAL KNEE ARTHROPLASTY;  Surgeon: Tobi Bastos, MD;  Location[facility];  Service: Orthopedics;  Laterality: Right;    There were no vitals filed for this  visit.   Subjective Assessment - 06/05/20 0934    Subjective R shoulder is still sore. Heat helps.    Pertinent History STEMI 2019, HLD, GERD, B knee arthroscopy, R TKA 2015    Diagnostic tests none recent    Patient Stated Goals decrease pain/return to golf    Currently in Pain? Yes    Pain Score 2     Pain Location Shoulder    Pain Orientation Right;Anterior;Lateral    Pain Descriptors / Indicators Sore    Pain Type Acute pain                             [facility] Adult PT Treatment/Exercise - 06/05/20 0001      Elbow Exercises   Elbow Flexion Strengthening;Right;10 reps;Seated    Bar Weights/Barbell (Elbow Flexion) 1 lb;2 lbs    Elbow Flexion Limitations good tolerance; cues for scap retraction; 10x 1# supinated, 10x 2# supination, 10x 2# neutral      Shoulder Exercises: Seated   Flexion AROM;Right;5 reps    Flexion Limitations slight R shoulder hike   no pain/popping   Abduction AROM;Right;5 reps    ABduction Weight (lbs) 2x5 scaption with manual scapular upward rotation and depression; 5x abduction   palpable cavitation in R shoulder     Shoulder Exercises: Standing   External Rotation  Strengthening;Right;10 reps;Theraband    Theraband Level (Shoulder External Rotation) Level 1 (Yellow)    External Rotation Limitations isometric stepouts in neutral   cueing to depress shoulder   Internal Rotation Strengthening;Right;10 reps    Theraband Level (Shoulder Internal Rotation) Level 1 (Yellow)    Internal Rotation Limitations isometric stepouts in neutral      Shoulder Exercises: ROM/Strengthening   UBE (Upper Arm Bike) L1.0 3 min fwd/3 min back with R shoulder AAROM      Manual Therapy   Manual Therapy Soft tissue mobilization;Myofascial release    Soft tissue mobilization STM/DTM and IASTM to R  lateral and anterior deltoid, R biceps muscle belly    Myofascial Release manual TPR to  R  lateral and anterior deltoid and biceps                  PT  Education - 06/05/20 1015    Education Details update to HEP    Person(s) Educated Patient    Methods Explanation;Demonstration;Tactile cues;Verbal cues;Handout    Comprehension Returned demonstration;Verbalized understanding            PT Short Term Goals - 04/17/20 1527      PT SHORT TERM GOAL #1   Title Patient to be independent with initial HEP.    Time 3    Period Weeks    Status Achieved    Target Date 05/03/20             PT Long Term Goals - 05/25/20 0939      PT LONG TERM GOAL #1   Title Patient to be independent with advanced HEP.    Time 8    Period Weeks    Status Partially Met   met for current     PT LONG TERM GOAL #2   Title Patient to demonstrate R UE strength >/=4+/5.    Time 8    Period Weeks    Status On-going   NT     PT LONG TERM GOAL #3   Title Patient to demonstrate R shoulder AROM WFL and without pain limiting.    Time 8    Period Weeks    Status On-going   able to perform PROM and supine AAROM within protocol limits     PT LONG TERM GOAL #4   Title Patient to demonstrate L UE overhead lift with 5lbs with good scapulohumeral rhythm.    Time 8    Period Weeks    Status On-going   NT                Plan - 06/05/20 1017    Clinical Impression Statement Patient reporting continued mild soreness in the R shoulder. Worked on addressing remaining R shoulder soft tissue restriction and pain with MT to the R anterior and lateral deltoid as well as biceps muscle belly. Soft tissue restriction improved considerably after MT. Initiated light biceps curls which patient performed with excellent control and tolerance. Also initiated banded isometrics with patient demonstrating excellent RTC requirement while maintaining good alignment.  Updated HEP with today's new exercises that were well-tolerated. Patient reported understanding and reported pain no worse at end of session.    Personal Factors and Comorbidities Age;Comorbidity  3+;Fitness;Past/Current Experience;Time since onset of injury/illness/exacerbation    Comorbidities STEMI 2019, HLD, GERD, B knee arthroscopy, R TKA 2015    PT Frequency 2x / week    PT Duration 8 weeks    PT Treatment/Interventions ADLs/Self Care Home Management;Cryotherapy;Dealer  Stimulation;Moist Heat;Therapeutic exercise;Therapeutic activities;Functional mobility training;Ultrasound;Neuromuscular re-education;Patient/family education;Manual techniques;Vasopneumatic Device;Taping;Energy conservation;Dry needling;Passive range of motion;Scar mobilization    PT Next Visit Plan progress per protocol    Consulted and Agree with Plan of Care Patient           Patient will benefit from skilled therapeutic intervention in order to improve the following deficits and impairments:  Hypomobility,Increased edema,Decreased scar mobility,Decreased activity tolerance,Decreased strength,Pain,Impaired UE functional use,Increased fascial restricitons,Decreased knowledge of precautions,Increased muscle spasms,Improper body mechanics,Decreased range of motion,Impaired flexibility,Postural dysfunction  Visit Diagnosis: Acute pain of right shoulder  Stiffness of right shoulder, not elsewhere classified  Muscle weakness (generalized)  Abnormal posture     Problem List Patient Active Problem List   Diagnosis Date Noted  . Chest pain of uncertain etiology 94/00/0505  . Dyspnea 11/26/2017  . CAD S/P RI PCI 10/29/2017  . Essential hypertension 10/29/2017  . History of ST elevation myocardial infarction (STEMI) 10/19/2017  . 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 Rash and nonspecific skin eruption 04/30/2013  . Cellulitis of finger of left hand 04/30/2013  . Routine general medical examination at a health care facility 01/22/2013  . BPH (benign prostatic hypertrophy) 11/28/2012  . Need for prophylactic vaccination and inoculation against influenza 11/28/2012  . Bradycardia 10/01/2011  .  Tobacco abuse 10/01/2011  . Diverticular disease   . Neck pain   . Dyslipidemia, goal LDL below 70   . Allergic rhinitis   . Allergic sinusitis   . GERD (gastroesophageal reflux disease)   . Seborrheic keratosis   . Osteoarthritis   . Erectile dysfunction   . Colon polyp      Janene Harvey, PT, DPT 06/05/20 10:21 AM   [facility] [address]  [place], [place], [address] Phone: [phone]   Fax:  [phone]  Name: Alec Johnson MRN: [id number] Date of Birth: [birth year]

## 2020-06-07 ENCOUNTER — Ambulatory Visit: Payer: Medicare Other | Admitting: Physical Therapy

## 2020-06-07 ENCOUNTER — Other Ambulatory Visit: Payer: Self-pay

## 2020-06-07 ENCOUNTER — Encounter: Payer: Self-pay | Admitting: Physical Therapy

## 2020-06-07 DIAGNOSIS — M25511 Pain in right shoulder: Secondary | ICD-10-CM | POA: Diagnosis not present

## 2020-06-07 DIAGNOSIS — M25611 Stiffness of right shoulder, not elsewhere classified: Secondary | ICD-10-CM

## 2020-06-07 DIAGNOSIS — R293 Abnormal posture: Secondary | ICD-10-CM

## 2020-06-07 DIAGNOSIS — M6281 Muscle weakness (generalized): Secondary | ICD-10-CM

## 2020-06-07 NOTE — Patient Instructions (Signed)
Access Code: [email or web address] URL: [email or web address] Date: 06/07/2020 Prepared by: Grayling Congress  Exercises Standing Bicep Curls Supinated with Dumbbells - 2 x daily - 7 x weekly - 2 sets - 10 reps Shoulder External Rotation Reactive Isometrics - 2 x daily - 7 x weekly - 2 sets - 10 reps Shoulder Internal Rotation Reactive Isometrics - 2 x daily - 7 x weekly - 2 sets - 10 reps Standing Shoulder Internal Rotation AAROM with [place] - 2 x daily - 7 x weekly - 2 sets - 10 reps - 3 sec hold Single Arm Shoulder Flexion with Dumbbell - 2 x daily - 7 x weekly - 2 sets - 5 reps Single Arm Scaption with Dumbbell - 2 x daily - 7 x weekly - 2 sets - 10 reps Standing Single Arm Shoulder Abduction with Dumbbell - Thumb Up - 2 x daily - 7 x weekly - 2 sets - 10 reps Scapular Protraction at Wall - 2 x daily - 7 x weekly - 2 sets - 10 reps

## 2020-06-07 NOTE — Therapy (Signed)
[facility] [address]  [place], [place], [address] Phone: [phone]   Fax:  [phone]  Physical Therapy Treatment  Patient Details  Name: Alec Johnson MRN: [id number] Date of Birth: Mar 14, 1950 Referring Provider (PT): Sydnee Cabal, MD   Encounter Date: 06/07/2020   PT End of Session - 06/07/20 1017    Visit Number 14    Number of Visits 26    Date for PT Re-Evaluation 07/19/20    Authorization Type UHC Medicare    PT Start Time 0930    PT Stop Time 1023    PT Time Calculation (min) 53 min    Activity Tolerance Patient tolerated treatment well;Patient limited by fatigue    Behavior During Therapy [facility] for tasks assessed/performed           Past Medical History:  Diagnosis Date  . Colon polyp   . Diverticular disease   . Erectile dysfunction   . GERD (gastroesophageal reflux disease)    NO Recent problems  . Hyperlipidemia   . Osteoarthritis   . Seborrheic keratosis   . STEMI (ST elevation myocardial infarction) ([place])    10/19/17 PCI/DES to ramus with CTO of the mRCA with collaterals, normal EF    Past Surgical History:  Procedure Laterality Date  . athroscopic knee surgery     BIL KNEES  . CORONARY/GRAFT ACUTE MI REVASCULARIZATION N/A 10/19/2017   Procedure: Coronary/Graft Acute MI Revascularization;  Surgeon: Jettie Booze, MD;  Location: [place] [facility];  Service: Cardiovascular;  Laterality: N/A;  . LEFT HEART CATH AND CORONARY ANGIOGRAPHY N/A 10/19/2017   Procedure: LEFT HEART CATH AND CORONARY ANGIOGRAPHY;  Surgeon: Jettie Booze, MD;  Location: [place] [facility];  Service: Cardiovascular;  Laterality: N/A;  . LEG SURGERY  1966   PINNING DUE TO INJURY  . NOSE SURGERY    . TOTAL KNEE ARTHROPLASTY Right 01/18/2014   Procedure: RIGHT TOTAL KNEE ARTHROPLASTY;  Surgeon: Tobi Bastos, MD;  Location: [facility];  Service: Orthopedics;  Laterality: Right;    There were no  vitals filed for this visit.   Subjective Assessment - 06/07/20 0933    Subjective Shoulder is not as sore today. Reports 75% improvement in R shoulder. Still notes difficulty reaching out to the side and up the back.    Pertinent History STEMI 2019, HLD, GERD, B knee arthroscopy, R TKA 2015    Diagnostic tests none recent    Patient Stated Goals decrease pain/return to golf    Currently in Pain? Yes    Pain Score 1     Pain Location Shoulder    Pain Orientation Right;Anterior;Lateral    Pain Descriptors / Indicators Sore    Pain Type Acute pain              [facility] PT Assessment - 06/07/20 0001      Assessment   Medical Diagnosis Encounter for other orthopedic aftercare- s/p R RTC repair, SAD, DCR    Referring Provider (PT) Sydnee Cabal, MD    Onset Date/Surgical Date 04/05/20      Observation/Other Assessments   Focus on Therapeutic Outcomes (FOTO)  shoulder: 64      AROM   Right Shoulder Flexion 150 Degrees    Right Shoulder ABduction 113 Degrees    Right Shoulder Internal Rotation --   FIR T9   Right Shoulder External Rotation --   FER C7     Strength   Strength  Assessment Site Elbow    Right/Left Shoulder Right    Right Shoulder Flexion 4+/5    Right Shoulder ABduction 4/5    Right Shoulder Internal Rotation 4/5    Right Shoulder External Rotation 4+/5    Right/Left Elbow Right;Left    Right Elbow Flexion 4+/5    Right Elbow Extension 4+/5    Left Elbow Flexion 4+/5    Left Elbow Extension 4+/5                         [facility] Adult PT Treatment/Exercise - 06/07/20 0001      Shoulder Exercises: Seated   Other Seated Exercises standing serratus piunches at wall 10x   manual cueing for proper scapular movement   Other Seated Exercises R shoulder flexion 1# 5x, 2# 5x; scaption 5x 1#; abduction 1# 5x   manual and verbal cueing to depress shoulder     Shoulder Exercises: Standing   Other Standing Exercises R shoulder AAROM IR with wand 5x5" each of  AAROM and AAROM when crossing midline   good tolerance     Shoulder Exercises: ROM/Strengthening   UBE (Upper Arm Bike) L1.0 3 min fwd/3 min back with R shoulder AAROM      Vasopneumatic   Number Minutes Vasopneumatic  10 minutes    Vasopnuematic Location  Shoulder   R   Vasopneumatic Pressure Low    Vasopneumatic Temperature  coldest                  PT Education - 06/07/20 1016    Education Details update/consolidation to [employer]) Educated Patient    Methods Explanation;Demonstration;Tactile cues;Verbal cues;Handout    Comprehension Verbalized understanding;Returned demonstration            PT Short Term Goals - 06/07/20 0935      PT SHORT TERM GOAL #1   Title Patient to be independent with initial HEP.    Time 3    Period Weeks    Status Achieved    Target Date 05/03/20             PT Long Term Goals - 06/07/20 0935      PT LONG TERM GOAL #1   Title Patient to be independent with advanced HEP.    Time 6    Period Weeks    Status Partially Met   met for current   Target Date 07/19/20      PT LONG TERM GOAL #2   Title Patient to demonstrate R UE strength >/=4+/5.    Time 6    Period Weeks    Status Partially Met   remaining weakness in abduction and IR   Target Date 07/19/20      PT LONG TERM GOAL #3   Title Patient to demonstrate R shoulder AROM WFL and without pain limiting.    Time 6    Period Weeks    Status On-going   improving, still limited   Target Date 07/19/20      PT LONG TERM GOAL #4   Title Patient to demonstrate L UE overhead lift with 5lbs with good scapulohumeral rhythm.    Time 6    Period Weeks    Status On-going   able to perform with 2lbs   Target Date 07/19/20                 Plan - 06/07/20 1020    Clinical Impression Statement  Patient reporting decreased soreness in the R shoulder today. Reports 75% improvement in R shoulder. Still notes difficulty reaching out to the side and up the back. R shoulder  AROM has improved in all planes. Initiated R shoulder IR AAROM to tolerance which patient performed with little pain. Strength tested quite well in the R shoulder and elbow- remaining weakness evident in abduction and IR. Able to lift 1 and 2 lbs overhead without pain today, however with heavy verbal and tactile cueing to correct shoulder hike. Updated and consolidated HEP to address remaining impairments and ended session with [employer] to R shoulder for muscle soreness. Patient is demonstrating excellent improvement towards goals. Would benefit from additional skilled PT services 2x/week for 6 weeks to address remaining deficits and return to PLOF.    Personal Factors and Comorbidities Age;Comorbidity 3+;Fitness;Past/Current Experience;Time since onset of injury/illness/exacerbation    Comorbidities STEMI 2019, HLD, GERD, B knee arthroscopy, R TKA 2015    PT Frequency 2x / week    PT Duration 6 weeks    PT Treatment/Interventions ADLs/Self Care Home Management;Cryotherapy;Electrical Stimulation;Moist Heat;Therapeutic exercise;Therapeutic activities;Functional mobility training;Ultrasound;Neuromuscular re-education;Patient/family education;Manual techniques;Vasopneumatic Device;Taping;Energy conservation;Dry needling;Passive range of motion;Scar mobilization;Iontophoresis 43m/ml Dexamethasone    PT Next Visit Plan progress per protocol    Consulted and Agree with Plan of Care Patient           Patient will benefit from skilled therapeutic intervention in order to improve the following deficits and impairments:  Hypomobility,Increased edema,Decreased scar mobility,Decreased activity tolerance,Decreased strength,Pain,Impaired UE functional use,Increased fascial restricitons,Decreased knowledge of precautions,Increased muscle spasms,Improper body mechanics,Decreased range of motion,Impaired flexibility,Postural dysfunction  Visit Diagnosis: Acute pain of right shoulder  Stiffness of right shoulder,  not elsewhere classified  Muscle weakness (generalized)  Abnormal posture     Problem List Patient Active Problem List   Diagnosis Date Noted  . Chest pain of uncertain etiology 102/33/4356 . Dyspnea 11/26/2017  . CAD S/P RI PCI 10/29/2017  . Essential hypertension 10/29/2017  . History of ST elevation myocardial infarction (STEMI) 10/19/2017  . 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 Rash and nonspecific skin eruption 04/30/2013  . Cellulitis of finger of left hand 04/30/2013  . Routine general medical examination at a health care facility 01/22/2013  . BPH (benign prostatic hypertrophy) 11/28/2012  . Need for prophylactic vaccination and inoculation against influenza 11/28/2012  . Bradycardia 10/01/2011  . Tobacco abuse 10/01/2011  . Diverticular disease   . Neck pain   . Dyslipidemia, goal LDL below 70   . Allergic rhinitis   . Allergic sinusitis   . GERD (gastroesophageal reflux disease)   . Seborrheic keratosis   . Osteoarthritis   . Erectile dysfunction   . Colon polyp      YJanene Harvey PT, DPT 06/07/20 10:29 AM   [facility] [place] [address]Phone: [phone]  Fax:  [phone] Name: MMONTREAL STEIDLEMRN: [id number] of Birth: [birth year]

## 2020-06-12 ENCOUNTER — Ambulatory Visit: Payer: Medicare Other | Admitting: Physical Therapy

## 2020-06-12 ENCOUNTER — Other Ambulatory Visit: Payer: Self-pay

## 2020-06-12 ENCOUNTER — Encounter: Payer: Self-pay | Admitting: Physical Therapy

## 2020-06-12 DIAGNOSIS — R293 Abnormal posture: Secondary | ICD-10-CM

## 2020-06-12 DIAGNOSIS — M25511 Pain in right shoulder: Secondary | ICD-10-CM | POA: Diagnosis not present

## 2020-06-12 DIAGNOSIS — M6281 Muscle weakness (generalized): Secondary | ICD-10-CM

## 2020-06-12 DIAGNOSIS — M25611 Stiffness of right shoulder, not elsewhere classified: Secondary | ICD-10-CM

## 2020-06-12 NOTE — Therapy (Signed)
[facility] [address]  [place], [place], [address] Phone: [phone]   Fax:  [phone]  Physical Therapy Treatment  Patient Details  Name: Alec Johnson MRN: [id number] Date of Birth: 1950-06-12 Referring Provider (PT): Sydnee Cabal, MD   Encounter Date: 06/12/2020   PT End of Session - 06/12/20 1012    Visit Number 15    Number of Visits 26    Date for PT Re-Evaluation 07/19/20    Authorization Type UHC Medicare    PT Start Time 0934    PT Stop Time 1021    PT Time Calculation (min) 47 min    Activity Tolerance Patient tolerated treatment well    Behavior During Therapy [facility] for tasks assessed/performed           Past Medical History:  Diagnosis Date  . Colon polyp   . Diverticular disease   . Erectile dysfunction   . GERD (gastroesophageal reflux disease)    NO Recent problems  . Hyperlipidemia   . Osteoarthritis   . Seborrheic keratosis   . STEMI (ST elevation myocardial infarction) ([place])    10/19/17 PCI/DES to ramus with CTO of the mRCA with collaterals, normal EF    Past Surgical History:  Procedure Laterality Date  . athroscopic knee surgery     BIL KNEES  . CORONARY/GRAFT ACUTE MI REVASCULARIZATION N/A 10/19/2017   Procedure: Coronary/Graft Acute MI Revascularization;  Surgeon: Jettie Booze, MD;  Location: [place] [facility];  Service: Cardiovascular;  Laterality: N/A;  . LEFT HEART CATH AND CORONARY ANGIOGRAPHY N/A 10/19/2017   Procedure: LEFT HEART CATH AND CORONARY ANGIOGRAPHY;  Surgeon: Jettie Booze, MD;  Location: [place] [facility];  Service: Cardiovascular;  Laterality: N/A;  . LEG SURGERY  1966   PINNING DUE TO INJURY  . NOSE SURGERY    . TOTAL KNEE ARTHROPLASTY Right 01/18/2014   Procedure: RIGHT TOTAL KNEE ARTHROPLASTY;  Surgeon: Tobi Bastos, MD;  Location: [facility];  Service: Orthopedics;  Laterality: Right;    There were no vitals filed for this  visit.   Subjective Assessment - 06/12/20 0937    Subjective Feels like his shoulder flare is resolving but is a little sore right now d/t sleeping on the R shoulder last ngiht.    Pertinent History STEMI 2019, HLD, GERD, B knee arthroscopy, R TKA 2015    Diagnostic tests none recent    Patient Stated Goals decrease pain/return to golf    Currently in Pain? Yes    Pain Score 4     Pain Location Shoulder    Pain Orientation Right;Anterior    Pain Descriptors / Indicators Sore              [facility] PT Assessment - 06/12/20 0001      AROM   Right Shoulder Internal Rotation --   FIR T9 with strap assist                        [facility] Adult PT Treatment/Exercise - 06/12/20 0001      Elbow Exercises   Elbow Flexion Strengthening;Right;Seated;15 reps    Theraband Level (Elbow Flexion) Level 2 (Red)    Elbow Flexion Limitations cues to stand up tall and maintain palm up      Shoulder Exercises: Standing   Protraction Strengthening;Both;10 reps    Protraction Limitations serratus punch against wall   cues to maintain spine neutral  Other Standing Exercises wall push up 10x   cues for positioning     Shoulder Exercises: ROM/Strengthening   UBE (Upper Arm Bike) L1.0 3 min fwd/3 min back with R shoulder AAROM      Shoulder Exercises: Stretch   Corner Stretch 2 reps;30 seconds    Corner Stretch Limitations cues to hold full duration of stretch but avoid pushing into pain    Other Shoulder Stretches R IR stretch with strap 10x3"   good ROM and tolerance     Vasopneumatic   Number Minutes Vasopneumatic  10 minutes    Vasopnuematic Location  Shoulder   R   Vasopneumatic Pressure Low    Vasopneumatic Temperature  coldest      Manual Therapy   Manual Therapy Soft tissue mobilization;Myofascial release    Manual therapy comments supine    Soft tissue mobilization STM to R pec and biceps    Myofascial Release manual TPR to R pec    Passive ROM R pec 90 deg stretch 2x30"    good tolerance                 PT Education - 06/12/20 1012    Education Details update to HEP    Person(s) Educated Patient    Methods Explanation;Demonstration;Tactile cues;Verbal cues;Handout    Comprehension Verbalized understanding;Returned demonstration            PT Short Term Goals - 06/07/20 0935      PT SHORT TERM GOAL #1   Title Patient to be independent with initial HEP.    Time 3    Period Weeks    Status Achieved    Target Date 05/03/20             PT Long Term Goals - 06/07/20 0935      PT LONG TERM GOAL #1   Title Patient to be independent with advanced HEP.    Time 6    Period Weeks    Status Partially Met   met for current   Target Date 07/19/20      PT LONG TERM GOAL #2   Title Patient to demonstrate R UE strength >/=4+/5.    Time 6    Period Weeks    Status Partially Met   remaining weakness in abduction and IR   Target Date 07/19/20      PT LONG TERM GOAL #3   Title Patient to demonstrate R shoulder AROM WFL and without pain limiting.    Time 6    Period Weeks    Status On-going   improving, still limited   Target Date 07/19/20      PT LONG TERM GOAL #4   Title Patient to demonstrate L UE overhead lift with 5lbs with good scapulohumeral rhythm.    Time 6    Period Weeks    Status On-going   able to perform with 2lbs   Target Date 07/19/20                 Plan - 06/12/20 1013    Clinical Impression Statement Patient arrived to session with report of increased soreness in the R shoulder this AM d/t sleeping on that side, however noting improvement in recent shoulder flare. Worked on MT to address soft tissue restriction, pain, and trigger points in R pec and biceps. Patient remarked on much improved pain and tightness after MT. Proceeded with gentle pec stretching, which patient tolerated well and reported good benefit with. Patient  performed R UE WBing ther-ex with good form and tolerance. Increased challenge with  shoulder IR with good ROM. Updated HEP with today's stretching- patient reported understanding. Patient reported decrease in shoulder soreness to 2/10 at end of session, ended with [facility] for remaining soreness. Patient is progressing well towards goals.    Personal Factors and Comorbidities Age;Comorbidity 3+;Fitness;Past/Current Experience;Time since onset of injury/illness/exacerbation    Comorbidities STEMI 2019, HLD, GERD, B knee arthroscopy, R TKA 2015    PT Frequency 2x / week    PT Duration 6 weeks    PT Treatment/Interventions ADLs/Self Care Home Management;Cryotherapy;Electrical Stimulation;Moist Heat;Therapeutic exercise;Therapeutic activities;Functional mobility training;Ultrasound;Neuromuscular re-education;Patient/family education;Manual techniques;Vasopneumatic Device;Taping;Energy conservation;Dry needling;Passive range of motion;Scar mobilization;Iontophoresis 39m/ml Dexamethasone    PT Next Visit Plan progress per protocol    Consulted and Agree with Plan of Care Patient           Patient will benefit from skilled therapeutic intervention in order to improve the following deficits and impairments:  Hypomobility,Increased edema,Decreased scar mobility,Decreased activity tolerance,Decreased strength,Pain,Impaired UE functional use,Increased fascial restricitons,Decreased knowledge of precautions,Increased muscle spasms,Improper body mechanics,Decreased range of motion,Impaired flexibility,Postural dysfunction  Visit Diagnosis: Acute pain of right shoulder  Stiffness of right shoulder, not elsewhere classified  Muscle weakness (generalized)  Abnormal posture     Problem List Patient Active Problem List   Diagnosis Date Noted  . Chest pain of uncertain etiology 115/72/6203 . Dyspnea 11/26/2017  . CAD S/P RI PCI 10/29/2017  . Essential hypertension 10/29/2017  . History of ST elevation myocardial infarction (STEMI) 10/19/2017  . Total knee replacement status  01/18/2014  . Rash and nonspecific skin eruption 04/30/2013  . Cellulitis of finger of left hand 04/30/2013  . Routine general medical examination at a health care facility 01/22/2013  . BPH (benign prostatic hypertrophy) 11/28/2012  . Need for prophylactic vaccination and inoculation against influenza 11/28/2012  . Bradycardia 10/01/2011  . Tobacco abuse 10/01/2011  . Diverticular disease   . Neck pain   . Dyslipidemia, goal LDL below 70   . Allergic rhinitis   . Allergic sinusitis   . GERD (gastroesophageal reflux disease)   . Seborrheic keratosis   . Osteoarthritis   . Erectile dysfunction   . Colon polyp      YJanene Harvey PT, DPT 06/12/20 10:22 AM   [facility] [place] [address]Phone: [phone]  Fax:  [phone] Name: Alec GUDEMRN: [id number] of Birth: 311-12-1950

## 2020-06-19 ENCOUNTER — Encounter: Payer: Self-pay | Admitting: Rehabilitative and Restorative Service Providers"

## 2020-06-19 ENCOUNTER — Ambulatory Visit: Payer: Medicare Other | Attending: Specialist | Admitting: Rehabilitative and Restorative Service Providers"

## 2020-06-19 ENCOUNTER — Other Ambulatory Visit: Payer: Self-pay

## 2020-06-19 DIAGNOSIS — M25511 Pain in right shoulder: Secondary | ICD-10-CM | POA: Diagnosis not present

## 2020-06-19 DIAGNOSIS — M6281 Muscle weakness (generalized): Secondary | ICD-10-CM | POA: Insufficient documentation

## 2020-06-19 DIAGNOSIS — R293 Abnormal posture: Secondary | ICD-10-CM | POA: Insufficient documentation

## 2020-06-19 DIAGNOSIS — M25611 Stiffness of right shoulder, not elsewhere classified: Secondary | ICD-10-CM | POA: Insufficient documentation

## 2020-06-19 NOTE — Therapy (Signed)
[facility] [address]  [place], [place], [address] Phone: [phone]   Fax:  [phone]  Physical Therapy Treatment  Patient Details  Name: Alec Johnson MRN: [id number] Date of Birth: 25-Jan-1950 Referring Provider (PT): Sydnee Cabal, MD   Encounter Date: 06/19/2020   PT End of Session - 06/19/20 0805    Visit Number 16    Date for PT Re-Evaluation 07/19/20    Authorization Type UHC Medicare    PT Start Time 0800    PT Stop Time 0850    PT Time Calculation (min) 50 min    Activity Tolerance Patient tolerated treatment well    Behavior During Therapy [facility] for tasks assessed/performed           Past Medical History:  Diagnosis Date  . Colon polyp   . Diverticular disease   . Erectile dysfunction   . GERD (gastroesophageal reflux disease)    NO Recent problems  . Hyperlipidemia   . Osteoarthritis   . Seborrheic keratosis   . STEMI (ST elevation myocardial infarction) ([place])    10/19/17 PCI/DES to ramus with CTO of the mRCA with collaterals, normal EF    Past Surgical History:  Procedure Laterality Date  . athroscopic knee surgery     BIL KNEES  . CORONARY/GRAFT ACUTE MI REVASCULARIZATION N/A 10/19/2017   Procedure: Coronary/Graft Acute MI Revascularization;  Surgeon: Jettie Booze, MD;  Location: [place] [facility];  Service: Cardiovascular;  Laterality: N/A;  . LEFT HEART CATH AND CORONARY ANGIOGRAPHY N/A 10/19/2017   Procedure: LEFT HEART CATH AND CORONARY ANGIOGRAPHY;  Surgeon: Jettie Booze, MD;  Location: [place] [facility];  Service: Cardiovascular;  Laterality: N/A;  . LEG SURGERY  1966   PINNING DUE TO INJURY  . NOSE SURGERY    . TOTAL KNEE ARTHROPLASTY Right 01/18/2014   Procedure: RIGHT TOTAL KNEE ARTHROPLASTY;  Surgeon: Tobi Bastos, MD;  Location: [facility];  Service: Orthopedics;  Laterality: Right;    There were no vitals filed for this visit.   Subjective Assessment  - 06/19/20 0803    Subjective I am doing well.  I am going to the [facility] in [place] tomorrow.    Pertinent History STEMI 2019, HLD, GERD, B knee arthroscopy, R TKA 2015    Patient Stated Goals decrease pain/return to golf    Currently in Pain? Yes    Pain Score 1     Pain Location Shoulder    Pain Orientation Right;Anterior    Pain Descriptors / Indicators Sore                             [facility] Adult PT Treatment/Exercise - 06/19/20 0001      Elbow Exercises   Elbow Flexion Strengthening;Right;Seated;15 reps    Theraband Level (Elbow Flexion) Level 2 (Red)      Shoulder Exercises: Seated   Other Seated Exercises 2# weight on R wrist:  putting 5 cones from counter to cabinet and back x5 reps    Other Seated Exercises R shoulder 2# x15 each: flexion, scaption, abduction      Shoulder Exercises: Standing   Protraction Strengthening;Both;15 reps    Protraction Limitations serratus punch against wall    External Rotation Strengthening;Right;15 reps    Theraband Level (Shoulder External Rotation) Level 2 (Red)    Internal Rotation Strengthening;Right;15 reps    Theraband Level (Shoulder Internal Rotation) Level 2 (  Red)    Flexion Strengthening;Both;15 reps    Theraband Level (Shoulder Flexion) Level 2 (Red)    Extension Strengthening;Both;15 reps    Theraband Level (Shoulder Extension) Level 2 (Red)    Row Strengthening;Both;15 reps    Theraband Level (Shoulder Row) Level 2 (Red)    Diagonals Strengthening;Right;15 reps    Theraband Level (Shoulder Diagonals) Level 2 (Red)    Diagonals Limitations D2 extension    Other Standing Exercises wall push up 2x10      Shoulder Exercises: ROM/Strengthening   UBE (Upper Arm Bike) L1.0 3 min fwd/3 min back with R shoulder AAROM      Shoulder Exercises: Stretch   Corner Stretch 2 reps;30 seconds    Corner Stretch Limitations cues to hold full duration of stretch but avoid pushing into pain    Other Shoulder Stretches  Finger ladder x3 each:  flexion and abduction   cues to walk fingers up and down, instead of sliding down the ladder for down     Modalities   Modalities Vasopneumatic      Vasopneumatic   Number Minutes Vasopneumatic  10 minutes    Vasopnuematic Location  Shoulder    Vasopneumatic Pressure Low    Vasopneumatic Temperature  coldest                    PT Short Term Goals - 06/07/20 0935      PT SHORT TERM GOAL #1   Title Patient to be independent with initial HEP.    Time 3    Period Weeks    Status Achieved    Target Date 05/03/20             PT Long Term Goals - 06/19/20 0859      PT LONG TERM GOAL #4   Title Patient to demonstrate L UE overhead lift with 5lbs with good scapulohumeral rhythm.    Status Partially Met                 Plan - 06/19/20 0856    Clinical Impression Statement Pt reports that he is doing well and goes for his follow up appt with Dr Theda Sers next weeks. He is maintaining improved posture with ther ex this session.  He has most difficulty with abduction with free weight and reports some muscle fatigue with reaching up to cabinet with 2# weight on wrist.  He continued with low pain throughout session and ended with Gameready ice to decrease DOMS following session.  He continues to progress towards goal related activities.    Personal Factors and Comorbidities Age;Comorbidity 3+;Fitness;Past/Current Experience;Time since onset of injury/illness/exacerbation    Comorbidities STEMI 2019, HLD, GERD, B knee arthroscopy, R TKA 2015    Examination-Activity Limitations Sleep;Bed Mobility;Carry;Transfers;Dressing;Hygiene/Grooming;Lift;Reach Overhead;Self Feeding    Examination-Participation Restrictions Cleaning;Shop;Community Activity;Driving;Yard Work;Laundry;Meal Prep    PT Treatment/Interventions ADLs/Self Care Home Management;Cryotherapy;Electrical Stimulation;Moist Heat;Therapeutic exercise;Therapeutic activities;Functional mobility  training;Ultrasound;Neuromuscular re-education;Patient/family education;Manual techniques;Vasopneumatic Device;Taping;Energy conservation;Dry needling;Passive range of motion;Scar mobilization;Iontophoresis 58m/ml Dexamethasone    PT Next Visit Plan progress per protocol    Consulted and Agree with Plan of Care Patient           Patient will benefit from skilled therapeutic intervention in order to improve the following deficits and impairments:  Hypomobility,Increased edema,Decreased scar mobility,Decreased activity tolerance,Decreased strength,Pain,Impaired UE functional use,Increased fascial restricitons,Decreased knowledge of precautions,Increased muscle spasms,Improper body mechanics,Decreased range of motion,Impaired flexibility,Postural dysfunction  Visit Diagnosis: Acute pain of right shoulder  Stiffness of right shoulder, not elsewhere  classified  Muscle weakness (generalized)  Abnormal posture     Problem List Patient Active Problem List   Diagnosis Date Noted  . Chest pain of uncertain etiology 64/29/0379  . Dyspnea 11/26/2017  . CAD S/P RI PCI 10/29/2017  . Essential hypertension 10/29/2017  . History of ST elevation myocardial infarction (STEMI) 10/19/2017  . 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 Rash and nonspecific skin eruption 04/30/2013  . Cellulitis of finger of left hand 04/30/2013  . Routine general medical examination at a health care facility 01/22/2013  . BPH (benign prostatic hypertrophy) 11/28/2012  . Need for prophylactic vaccination and inoculation against influenza 11/28/2012  . Bradycardia 10/01/2011  . Tobacco abuse 10/01/2011  . Diverticular disease   . Neck pain   . Dyslipidemia, goal LDL below 70   . Allergic rhinitis   . Allergic sinusitis   . GERD (gastroesophageal reflux disease)   . Seborrheic keratosis   . Osteoarthritis   . Erectile dysfunction   . Colon polyp     Juel Burrow, PT, DPT 06/19/2020, 9:00 AM  [facility] [address]  [place], [place], [address] Phone: [phone]   Fax:  [phone]  Name: TELLY JAWAD MRN: [id number] Date of Birth: 1950/03/02

## 2020-06-26 ENCOUNTER — Other Ambulatory Visit: Payer: Self-pay

## 2020-06-26 ENCOUNTER — Encounter: Payer: Self-pay | Admitting: Physical Therapy

## 2020-06-26 ENCOUNTER — Ambulatory Visit: Payer: Medicare Other | Attending: Specialist | Admitting: Physical Therapy

## 2020-06-26 DIAGNOSIS — M6281 Muscle weakness (generalized): Secondary | ICD-10-CM | POA: Diagnosis present

## 2020-06-26 DIAGNOSIS — M25511 Pain in right shoulder: Secondary | ICD-10-CM | POA: Insufficient documentation

## 2020-06-26 DIAGNOSIS — R293 Abnormal posture: Secondary | ICD-10-CM | POA: Diagnosis present

## 2020-06-26 DIAGNOSIS — M25611 Stiffness of right shoulder, not elsewhere classified: Secondary | ICD-10-CM | POA: Insufficient documentation

## 2020-06-26 NOTE — Therapy (Signed)
[facility] [address]  [place], [place], [address] Phone: [phone]   Fax:  [phone]  Physical Therapy Treatment  Patient Details  Name: Alec Johnson MRN: [id number] Date of Birth: December 05, 1950 Referring Provider (PT): Sydnee Cabal, MD   Encounter Date: 06/26/2020   PT End of Session - 06/26/20 0930    Visit Number 17    Number of Visits 26    Date for PT Re-Evaluation 07/19/20    Authorization Type UHC Medicare    PT Start Time 0841    PT Stop Time 0936    PT Time Calculation (min) 55 min    Activity Tolerance Patient tolerated treatment well    Behavior During Therapy [facility] for tasks assessed/performed           Past Medical History:  Diagnosis Date  . Colon polyp   . Diverticular disease   . Erectile dysfunction   . GERD (gastroesophageal reflux disease)    NO Recent problems  . Hyperlipidemia   . Osteoarthritis   . Seborrheic keratosis   . STEMI (ST elevation myocardial infarction) ([place])    10/19/17 PCI/DES to ramus with CTO of the mRCA with collaterals, normal EF    Past Surgical History:  Procedure Laterality Date  . athroscopic knee surgery     BIL KNEES  . CORONARY/GRAFT ACUTE MI REVASCULARIZATION N/A 10/19/2017   Procedure: Coronary/Graft Acute MI Revascularization;  Surgeon: Jettie Booze, MD;  Location: [place] [facility];  Service: Cardiovascular;  Laterality: N/A;  . LEFT HEART CATH AND CORONARY ANGIOGRAPHY N/A 10/19/2017   Procedure: LEFT HEART CATH AND CORONARY ANGIOGRAPHY;  Surgeon: Jettie Booze, MD;  Location: [place] [facility];  Service: Cardiovascular;  Laterality: N/A;  . LEG SURGERY  1966   PINNING DUE TO INJURY  . NOSE SURGERY    . TOTAL KNEE ARTHROPLASTY Right 01/18/2014   Procedure: RIGHT TOTAL KNEE ARTHROPLASTY;  Surgeon: Tobi Bastos, MD;  [facility];  Service: Orthopedics;  Laterality: Right;    There were no vitals filed for this  visit.   Subjective Assessment - 06/26/20 0838    Subjective Has a MD appointment tomorrow. Shoulder has been "really good." Has been doing more sets of his exercises than instructed. Reports 80% improvement in R shoulder. Notes no issues with ADLs at home except feeling weak when reaching up, but is not sure if he is cleared to weed eat and lift 50lbs.    Pertinent History STEMI 2019, HLD, GERD, B knee arthroscopy, R TKA 2015    Diagnostic tests none recent    Patient Stated Goals decrease pain/return to golf    Currently in Pain? Yes    Pain Score 1     Pain Location Shoulder    Pain Orientation Right;Anterior    Pain Descriptors / Indicators Sore    Pain Type Acute pain              [facility] PT Assessment - 06/26/20 0001      Assessment   Medical Diagnosis Encounter for other orthopedic aftercare- s/p R RTC repair, SAD, DCR    Referring Provider (PT) Sydnee Cabal, MD    Onset Date/Surgical Date 04/05/20      Observation/Other Assessments   Focus on Therapeutic Outcomes (FOTO)  shoulder: 74      AROM   Right Shoulder Flexion 153 Degrees    Right Shoulder ABduction 165 Degrees    Right Shoulder  Internal Rotation --   FIR T8   Right Shoulder External Rotation --   FER T1     Strength   Right Shoulder Flexion 5/5    Right Shoulder ABduction 4+/5    Right Shoulder Internal Rotation 4+/5    Right Shoulder External Rotation 4+/5    Right Elbow Flexion 5/5    Right Elbow Extension 5/5                         [facility] Adult PT Treatment/Exercise - 06/26/20 0001      Shoulder Exercises: Standing   Horizontal ABduction Strengthening;Both;10 reps;Theraband    Theraband Level (Shoulder Horizontal ABduction) Level 2 (Red)    Horizontal ABduction Limitations scapulae hugging doorframe    External Rotation Strengthening;Right;10 reps    Theraband Level (Shoulder External Rotation) Level 2 (Red)    External Rotation Limitations 2x10 ; shoulder in neutral    Internal  Rotation Strengthening;Right;10 reps    Internal Rotation Limitations 2x10 ; shoulder in neutral    Other Standing Exercises serratus rollups with foam roll and yellow TB 10x   c/o L shoulder blade pain   Other Standing Exercises R overhead reach to counter top with 2-5lb weights   good scapular mechanics and tolerance     Shoulder Exercises: ROM/Strengthening   UBE (Upper Arm Bike) L2.0 3 min fwd/3 min back    Lat Pull Limitations 10x 20#, 10x 25   verbal and manual cueing for shoulder depression   Cybex Row Limitations 35# 10x narow grip, 10x wide grip      Shoulder Exercises: Stretch   Other Shoulder Stretches R IR stretch with strap 10x3"      Vasopneumatic   Number Minutes Vasopneumatic  10 minutes    Vasopnuematic Location  Shoulder   R   Vasopneumatic Pressure Low    Vasopneumatic Temperature  coldest                  PT Education - 06/26/20 0929    Education Details discussion on objective progress; advised patient to speak with MD about return to golf and heavy lifting    Person(s) Educated Patient    Methods Explanation;Demonstration    Comprehension Verbalized understanding            PT Short Term Goals - 06/07/20 0935      PT SHORT TERM GOAL #1   Title Patient to be independent with initial HEP.    Time 3    Period Weeks    Status Achieved    Target Date 05/03/20             PT Long Term Goals - 06/26/20 0900      PT LONG TERM GOAL #1   Title Patient to be independent with advanced HEP.    Time 6    Period Weeks    Status Partially Met   met for current     PT LONG TERM GOAL #2   Title Patient to demonstrate R UE strength >/=4+/5.    Time 6    Period Weeks    Status Achieved      PT LONG TERM GOAL #3   Title Patient to demonstrate R shoulder AROM WFL and without pain limiting.    Time 6    Period Weeks    Status Achieved      PT LONG TERM GOAL #4   Title Patient to demonstrate L UE overhead lift  with 5lbs with good scapulohumeral  rhythm.    Time 6    Period Weeks    Status Achieved                 Plan - 06/26/20 0930    Clinical Impression Statement Patient arrived to session with report of 80% improvement in the R shoulder. Notes that he has no issues with ADLs at home except feeling weak when reaching overhead, however notes that he is not sure if he is cleared to weed eat and lift 50lbs. R shoulder and elbow strength goals have been met at this time. R shoulder AROM is now functional and pain-free. Patient was able to lift 5lbs overhead with good scapular mechanics and no pain today. Worked on progressive RTC strengthening with cueing to maintain control throughout. Initiated Patent examiner with patient demonstrating good form throughout, with exception of excessive shoulder hiking with lat pulldowns. Patient was able to tolerate entirety of session without excessive pain. Ended session with Gameready to R shoulder for post-exercise soreness. Patient has met all goals at this time. Plan for transition to HEP next session.    Personal Factors and Comorbidities Age;Comorbidity 3+;Fitness;Past/Current Experience;Time since onset of injury/illness/exacerbation    Comorbidities STEMI 2019, HLD, GERD, B knee arthroscopy, R TKA 2015    Examination-Activity Limitations Sleep;Bed Mobility;Carry;Transfers;Dressing;Hygiene/Grooming;Lift;Reach Overhead;Self Feeding    Examination-Participation Restrictions Cleaning;Shop;Community Activity;Driving;Yard Work;Laundry;Meal Prep    PT Treatment/Interventions ADLs/Self Care Home Management;Cryotherapy;Electrical Stimulation;Moist Heat;Therapeutic exercise;Therapeutic activities;Functional mobility training;Ultrasound;Neuromuscular re-education;Patient/family education;Manual techniques;Vasopneumatic Device;Taping;Energy conservation;Dry needling;Passive range of motion;Scar mobilization;Iontophoresis 4mg /ml Dexamethasone    PT Next Visit Plan transition to HEP    Consulted and  Agree with Plan of Care Patient           Patient will benefit from skilled therapeutic intervention in order to improve the following deficits and impairments:  Hypomobility,Increased edema,Decreased scar mobility,Decreased activity tolerance,Decreased strength,Pain,Impaired UE functional use,Increased fascial restricitons,Decreased knowledge of precautions,Increased muscle spasms,Improper body mechanics,Decreased range of motion,Impaired flexibility,Postural dysfunction  Visit Diagnosis: Acute pain of right shoulder  Stiffness of right shoulder, not elsewhere classified  Muscle weakness (generalized)  Abnormal posture     Problem List Patient Active Problem List   Diagnosis Date Noted  . Chest pain of uncertain etiology 27/03/5007  . Dyspnea 11/26/2017  . CAD S/P RI PCI 10/29/2017  . Essential hypertension 10/29/2017  . History of ST elevation myocardial infarction (STEMI) 10/19/2017  . 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 Rash and nonspecific skin eruption 04/30/2013  . Cellulitis of finger of left hand 04/30/2013  . Routine general medical examination at a health care facility 01/22/2013  . BPH (benign prostatic hypertrophy) 11/28/2012  . Need for prophylactic vaccination and inoculation against influenza 11/28/2012  . Bradycardia 10/01/2011  . Tobacco abuse 10/01/2011  . Diverticular disease   . Neck pain   . Dyslipidemia, goal LDL below 70   . Allergic rhinitis   . Allergic sinusitis   . GERD (gastroesophageal reflux disease)   . Seborrheic keratosis   . Osteoarthritis   . Erectile dysfunction   . Colon polyp      Janene Harvey, PT, DPT 06/26/20 10:40 AM   [facility] [address]  [place], [place], [address] Phone: [phone]   Fax:  [phone]  Name: Alec Johnson MRN: [id number] Date of Birth: [birth year]

## 2020-06-28 ENCOUNTER — Other Ambulatory Visit: Payer: Self-pay

## 2020-06-28 ENCOUNTER — Ambulatory Visit: Payer: Medicare Other | Admitting: Physical Therapy

## 2020-06-28 ENCOUNTER — Encounter: Payer: Self-pay | Admitting: Physical Therapy

## 2020-06-28 DIAGNOSIS — M25611 Stiffness of right shoulder, not elsewhere classified: Secondary | ICD-10-CM

## 2020-06-28 DIAGNOSIS — M25511 Pain in right shoulder: Secondary | ICD-10-CM | POA: Diagnosis not present

## 2020-06-28 DIAGNOSIS — M6281 Muscle weakness (generalized): Secondary | ICD-10-CM

## 2020-06-28 DIAGNOSIS — R293 Abnormal posture: Secondary | ICD-10-CM

## 2020-06-28 NOTE — Patient Instructions (Signed)
Access Code: [email or web address] URL: [email or web address] Date: 06/28/2020 Prepared by: Grayling Congress  Exercises Corner Pec Major Stretch - 1 x daily - 3 x weekly - 2 sets - 30 sec hold Serratus Activation at Wall with Foam Roll and Resistance Band - 1 x daily - 3 x weekly - 2 sets - 10 reps Standing Shoulder Internal Rotation with Anchored Resistance - 1 x daily - 3 x weekly - 2 sets - 10 reps Shoulder External Rotation with Anchored Resistance - 1 x daily - 3 x weekly - 2 sets - 10 reps Prone Single Arm Shoulder Y - 1 x daily - 3 x weekly - 2 sets - 10 reps Prone Shoulder Flexion - 1 x daily - 3 x weekly - 2 sets - 10 reps Prone Middle Trapezius Strengthening on Swiss Ball - 1 x daily - 3 x weekly - 2 sets - 10 reps

## 2020-06-28 NOTE — Therapy (Signed)
[facility] [address]  [place], [place], [address] Phone: [phone]   Fax:  [phone]  Physical Therapy Discharge Summary  Patient Details  Name: Alec Johnson MRN: [id number] Date of Birth: December 07, 1950 Referring Provider (PT): Sydnee Cabal, MD   Progress Note Reporting Period 05/29/20 to 06/28/20  See note below for Objective Data and Assessment of Progress/Goals.    Encounter Date: 06/28/2020   PT End of Session - 06/28/20 1004     Visit Number 18    Number of Visits 26    Date for PT Re-Evaluation 07/19/20    Authorization Type [employer] Medicare    PT Start Time 0934    PT Stop Time 1003    PT Time Calculation (min) 29 min    Activity Tolerance Patient tolerated treatment well    Behavior During Therapy WFL for tasks assessed/performed             Past Medical History:  Diagnosis Date   Colon polyp    Diverticular disease    Erectile dysfunction    GERD (gastroesophageal reflux disease)    NO Recent problems   Hyperlipidemia    Osteoarthritis    Seborrheic keratosis    STEMI (ST elevation myocardial infarction) ([place])    10/19/17 PCI/DES to ramus with CTO of the mRCA with collaterals, normal EF    Past Surgical History:  Procedure Laterality Date   athroscopic knee surgery     BIL KNEES   CORONARY/GRAFT ACUTE MI REVASCULARIZATION N/A 10/19/2017   Procedure: Coronary/Graft Acute MI Revascularization;  Surgeon: Jettie Booze, MD;  Location: [facility];  Service: Cardiovascular;  Laterality: N/A;   LEFT HEART CATH AND CORONARY ANGIOGRAPHY N/A 10/19/2017   Procedure: LEFT HEART CATH AND CORONARY ANGIOGRAPHY;  Surgeon: Jettie Booze, MD;  Location: [facility];  Service: Cardiovascular;  Laterality: N/A;   LEG SURGERY  1966   PINNING DUE TO INJURY   NOSE SURGERY     TOTAL KNEE ARTHROPLASTY Right 01/18/2014   Procedure: RIGHT TOTAL KNEE ARTHROPLASTY;  Surgeon:  Tobi Bastos, MD;  Location: [facility];  Service: Orthopedics;  Laterality: Right;    There were no vitals filed for this visit.   Subjective Assessment - 06/28/20 0936     Subjective Reports that his MD was very pleased with his progress. Was told that he can do anything that he wants except to do it slowly. Ready to wrap up with PT and would like to continue working on HEP.    Pertinent History STEMI 2019, HLD, GERD, B knee arthroscopy, R TKA 2015    Diagnostic tests none recent    Patient Stated Goals decrease pain/return to golf    Currently in Pain? No/denies                [facility] PT Assessment - 06/28/20 1007       Assessment   Medical Diagnosis Encounter for other orthopedic aftercare- s/p R RTC repair, SAD, DCR    Referring Provider (PT) Sydnee Cabal, MD    Onset Date/Surgical Date 04/05/20      Observation/Other Assessments   Focus on Therapeutic Outcomes (FOTO)  shoulder: 74      AROM   Overall AROM Comments objective measures carried over from 06/07    Right Shoulder Flexion 153 Degrees    Right Shoulder ABduction 165 Degrees    Right Shoulder Internal Rotation --   FIR T8  Right Shoulder External Rotation --   FER T1     Strength   Right Shoulder Flexion 5/5    Right Shoulder ABduction 4+/5    Right Shoulder Internal Rotation 4+/5    Right Shoulder External Rotation 4+/5    Right Elbow Flexion 5/5    Right Elbow Extension 5/5                           [facility] Adult PT Treatment/Exercise - 06/28/20 0001       Shoulder Exercises: Standing   External Rotation Strengthening;Right;10 reps    Theraband Level (Shoulder External Rotation) Level 3 (Green)    External Rotation Limitations shoulder in neutral    Internal Rotation Strengthening;Right;10 reps    Theraband Level (Shoulder Internal Rotation) Level 3 (Green)    Internal Rotation Limitations shoulder in neutral    Other Standing Exercises serratus rollups with foam roll and yellow  TB 10x    Other Standing Exercises R prone I, T, Y over green pball 10x each   c/o difficulty with I's and T's; practiced at counter     Shoulder Exercises: ROM/Strengthening   UBE (Upper Arm Bike) L2.0 3 min fwd/3 min back                    PT Education - 06/28/20 1004     Education Details update/consolidation to [employer]) Educated Patient    Methods Explanation;Demonstration;Tactile cues;Handout;Verbal cues    Comprehension Verbalized understanding;Returned demonstration              PT Short Term Goals - 06/07/20 0935       PT SHORT TERM GOAL #1   Title Patient to be independent with initial HEP.    Time 3    Period Weeks    Status Achieved    Target Date 05/03/20               PT Long Term Goals - 06/28/20 1006       PT LONG TERM GOAL #1   Title Patient to be independent with advanced HEP.    Time 6    Period Weeks    Status Achieved   met for current     PT LONG TERM GOAL #2   Title Patient to demonstrate R UE strength >/=4+/5.    Time 6    Period Weeks    Status Achieved      PT LONG TERM GOAL #3   Title Patient to demonstrate R shoulder AROM WFL and without pain limiting.    Time 6    Period Weeks    Status Achieved      PT LONG TERM GOAL #4   Title Patient to demonstrate L UE overhead lift with 5lbs with good scapulohumeral rhythm.    Time 6    Period Weeks    Status Achieved                   Plan - 06/28/20 1005     Clinical Impression Statement Patient arrived to session with report of being released by his MD, who was quite pleased with his progress with therapy. Patient is now ready to wrap up with PT and transition to HEP, thus took time to work on increasing challenge with ther-ex. Patient performed progressive RTC and periscapular strengthening with intermittent cues for form. Tolerated duration of session without pain. Patient has met all  goals at this time and is ready for DC with transition to HEP.     Personal Factors and Comorbidities Age;Comorbidity 3+;Fitness;Past/Current Experience;Time since onset of injury/illness/exacerbation    Comorbidities STEMI 2019, HLD, GERD, B knee arthroscopy, R TKA 2015    Examination-Activity Limitations Sleep;Bed Mobility;Carry;Transfers;Dressing;Hygiene/Grooming;Lift;Reach Overhead;Self Feeding    Examination-Participation Restrictions Cleaning;Shop;Community Activity;Driving;Yard Work;Laundry;Meal Prep    PT Treatment/Interventions ADLs/Self Care Home Management;Cryotherapy;Electrical Stimulation;Moist Heat;Therapeutic exercise;Therapeutic activities;Functional mobility training;Ultrasound;Neuromuscular re-education;Patient/family education;Manual techniques;Vasopneumatic Device;Taping;Energy conservation;Dry needling;Passive range of motion;Scar mobilization;Iontophoresis 4mg /ml Dexamethasone    PT Next Visit Plan DC at this time    Consulted and Agree with Plan of Care Patient             Patient will benefit from skilled therapeutic intervention in order to improve the following deficits and impairments:  Hypomobility, Increased edema, Decreased scar mobility, Decreased activity tolerance, Decreased strength, Pain, Impaired UE functional use, Increased fascial restricitons, Decreased knowledge of precautions, Increased muscle spasms, Improper body mechanics, Decreased range of motion, Impaired flexibility, Postural dysfunction  Visit Diagnosis: Acute pain of right shoulder  Stiffness of right shoulder, not elsewhere classified  Muscle weakness (generalized)  Abnormal posture     Problem List Patient Active Problem List   Diagnosis Date Noted   Chest pain of uncertain etiology 96/22/2979   Dyspnea 11/26/2017   CAD S/P RI PCI 10/29/2017   Essential hypertension 10/29/2017   History of ST elevation myocardial infarction (STEMI) 10/19/2017   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   Rash and nonspecific skin eruption 04/30/2013    Cellulitis of finger of left hand 04/30/2013   Routine general medical examination at a health care facility 01/22/2013   BPH (benign prostatic hypertrophy) 11/28/2012   Need for prophylactic vaccination and inoculation against influenza 11/28/2012   Bradycardia 10/01/2011   Tobacco abuse 10/01/2011   Diverticular disease    Neck pain    Dyslipidemia, goal LDL below 70    Allergic rhinitis    Allergic sinusitis    GERD (gastroesophageal reflux disease)    Seborrheic keratosis    Osteoarthritis    Erectile dysfunction    Colon polyp      PHYSICAL THERAPY DISCHARGE SUMMARY  Visits from Start of Care: 18  Current functional level related to goals / functional outcomes: See above clinical impression   Remaining deficits: None   Education / Equipment: HEP  Plan: Patient agrees to discharge.  Patient goals were met. Patient is being discharged due to meeting the stated rehab goals.      Janene Harvey, PT, DPT 06/28/20 10:09 AM   [facility] [address]  [place], [place], [address] Phone: [phone]   Fax:  [phone]  Name: Alec Johnson MRN: [id number] Date of Birth: 17-Aug-1950

## 2020-07-05 ENCOUNTER — Encounter: Payer: Medicare Other | Admitting: Physical Therapy

## 2020-07-26 NOTE — Progress Notes (Signed)
HPI: Follow-up CAD. Admitted September 2019 with acute infarct. Cardiac catheterization revealed an occluded RCA with left to right collaterals; 100% ostial ramus; normal LV function; PCI of ramus with drug-eluting stent. Abdominal ultrasound January 2020 showed no aneurysm.  Echocardiogram December 2021 showed normal LV function, moderate left ventricular hypertrophy, mild left atrial enlargement.  Nuclear study December 2021 showed ejection fraction 49%, prior apical infarct and no ischemia.  Since last seen the patient denies any dyspnea on exertion, orthopnea, PND, pedal edema, palpitations, syncope or chest pain.   Current Outpatient Medications  Medication Sig Dispense Refill   acetaminophen (TYLENOL) 500 MG tablet Take 1,000 mg by mouth every 6 (six) hours as needed for mild pain or moderate pain.     aspirin EC 81 MG tablet Take 81 mg by mouth daily.     atorvastatin (LIPITOR) 80 MG tablet TAKE 1 TABLET BY MOUTH  DAILY AT 6 PM 90 tablet 3   Cholecalciferol (VITAMIN D3) 5000 units TABS Take 5,000 Units by mouth 3 (three) times a week.      ezetimibe (ZETIA) 10 MG tablet TAKE 1 TABLET BY MOUTH  DAILY 90 tablet 3   fluticasone (FLONASE) 50 MCG/ACT nasal spray Place 2 sprays into both nostrils daily. 48 g 0   Glucosamine HCl (GLUCOSAMINE PO) Take 500 mg by mouth daily.     lisinopril (ZESTRIL) 10 MG tablet TAKE 1 TABLET BY MOUTH  DAILY (Patient taking differently: TAKE 1 TABLET BY MOUTH  DAILY) 90 tablet 3   meloxicam (MOBIC) 15 MG tablet Take 15 mg by mouth daily.     nitroGLYCERIN (NITROSTAT) 0.4 MG SL tablet Place 1 tablet (0.4 mg total) under the tongue every 5 (five) minutes x 3 doses as needed for chest pain. 25 tablet 1   No current facility-administered medications for this visit.     Past Medical History:  Diagnosis Date   Colon polyp    Diverticular disease    Erectile dysfunction    GERD (gastroesophageal reflux disease)    NO Recent problems   Hyperlipidemia     Osteoarthritis    Seborrheic keratosis    STEMI (ST elevation myocardial infarction) ([place])    10/19/17 PCI/DES to ramus with CTO of the mRCA with collaterals, normal EF    Past Surgical History:  Procedure Laterality Date   athroscopic knee surgery     BIL KNEES   CORONARY/GRAFT ACUTE MI REVASCULARIZATION N/A 10/19/2017   Procedure: Coronary/Graft Acute MI Revascularization;  Surgeon: Jettie Booze, MD;  Location: [facility];  Service: Cardiovascular;  Laterality: N/A;   LEFT HEART CATH AND CORONARY ANGIOGRAPHY N/A 10/19/2017   Procedure: LEFT HEART CATH AND CORONARY ANGIOGRAPHY;  Surgeon: Jettie Booze, MD;  Location: [facility];  Service: Cardiovascular;  Laterality: N/A;   LEG SURGERY  1966   PINNING DUE TO INJURY   NOSE SURGERY     TOTAL KNEE ARTHROPLASTY Right 01/18/2014   Procedure: RIGHT TOTAL KNEE ARTHROPLASTY;  Surgeon: Tobi Bastos, MD;  Location: [facility];  Service: Orthopedics;  Laterality: Right;    Social History   Socioeconomic History   Marital status: Married    Spouse name: [name]   Number of children: 2   Years of education: 12   Highest education level: Not on file  Occupational History   Occupation: RETIRED     Employer: [employer]  Tobacco Use   Smoking status: Former    Packs/day: 2.00  Years: 45.00    Pack years: 90.00    Types: Cigarettes    Quit date: 01/11/2012    Years since quitting: 8.5   Smokeless tobacco: Never   Tobacco comments:    He has quit smoking.    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.     Education:  [employer]    Tobacco Use/Exposure: He has  quit smoking.        Alcohol Use: Occasional    Drug Use:  None   Diet:  Regular   Exercise:  He was walking daily until he hurt his hip.     Hobbies:  Fishing, Environmental education officer, Golf    Social Determinants of Radio broadcast assistant Strain: Not on [employer] Insecurity: Not on file  Transportation Needs: Not on file  Physical Activity: Not on file  Stress: Not on file  Social Connections: Not on file  Intimate Partner Violence: Not on file    Family History  Problem Relation Age of Onset   Coronary artery disease Brother        MI   Heart attack Brother    Stroke Mother    Cancer Father        Lung Cancer   Cancer Paternal Grandfather        Lung Cancer   Heart attack Paternal Grandfather     ROS: no fevers or chills, productive cough, hemoptysis, dysphasia, odynophagia, melena, hematochezia, dysuria, hematuria, rash, seizure activity, orthopnea, PND, pedal edema, claudication. Remaining systems are negative.  Physical Exam: Well-developed well-nourished in no acute distress.  Skin is warm and dry.  HEENT is normal.  Neck is supple.  Chest is clear to auscultation with normal expansion.  Cardiovascular exam is regular rate and rhythm.  Abdominal exam nontender or distended. No masses palpated. Extremities show no edema. neuro grossly intact  ECG- personally reviewed  A/P  1 CAD-no CP; last nuclear showed no ischemia; continue ASA and statin.  2 hypertension-BP controlled; continue present meds.  3 hyperlipidemia-continue statin.   4 h/o bradycardia-continue to avoid AV nodal blocking agents.  Kirk Ruths, MD

## 2020-08-01 ENCOUNTER — Other Ambulatory Visit: Payer: Self-pay

## 2020-08-01 ENCOUNTER — Encounter: Payer: Self-pay | Admitting: Cardiology

## 2020-08-01 ENCOUNTER — Ambulatory Visit: Payer: Medicare Other | Admitting: Cardiology

## 2020-08-01 VITALS — BP 116/72 | HR 52 | Ht 70.0 in | Wt 230.1 lb

## 2020-08-01 DIAGNOSIS — R001 Bradycardia, unspecified: Secondary | ICD-10-CM

## 2020-08-01 DIAGNOSIS — I10 Essential (primary) hypertension: Secondary | ICD-10-CM | POA: Diagnosis not present

## 2020-08-01 DIAGNOSIS — Z9861 Coronary angioplasty status: Secondary | ICD-10-CM

## 2020-08-01 DIAGNOSIS — E785 Hyperlipidemia, unspecified: Secondary | ICD-10-CM | POA: Diagnosis not present

## 2020-08-01 DIAGNOSIS — I2510 Atherosclerotic heart disease of native coronary artery without angina pectoris: Secondary | ICD-10-CM

## 2020-08-01 NOTE — Patient Instructions (Signed)

## 2020-10-05 ENCOUNTER — Other Ambulatory Visit: Payer: Self-pay | Admitting: Cardiology

## 2020-10-18 ENCOUNTER — Other Ambulatory Visit (HOSPITAL_BASED_OUTPATIENT_CLINIC_OR_DEPARTMENT_OTHER): Payer: Self-pay

## 2020-10-18 MED ORDER — INFLUENZA VAC A&B SA ADJ QUAD 0.5 ML IM PRSY
PREFILLED_SYRINGE | INTRAMUSCULAR | 0 refills | Status: DC
  Filled 2020-10-18: qty 0.5, 1d supply, fill #0

## 2020-11-05 ENCOUNTER — Ambulatory Visit: Payer: Medicare Other | Attending: Internal Medicine

## 2020-11-05 DIAGNOSIS — Z23 Encounter for immunization: Secondary | ICD-10-CM

## 2020-12-03 ENCOUNTER — Other Ambulatory Visit (HOSPITAL_BASED_OUTPATIENT_CLINIC_OR_DEPARTMENT_OTHER): Payer: Self-pay

## 2020-12-03 MED ORDER — PFIZER COVID-19 VAC BIVALENT 30 MCG/0.3ML IM SUSP
INTRAMUSCULAR | 0 refills | Status: DC
Start: 2020-11-05 — End: 2022-11-12
  Filled 2020-12-03: qty 0.3, 1d supply, fill #0

## 2020-12-21 ENCOUNTER — Encounter (HOSPITAL_BASED_OUTPATIENT_CLINIC_OR_DEPARTMENT_OTHER): Payer: Self-pay

## 2020-12-21 ENCOUNTER — Emergency Department (HOSPITAL_BASED_OUTPATIENT_CLINIC_OR_DEPARTMENT_OTHER)
Admission: EM | Admit: 2020-12-21 | Discharge: 2020-12-21 | Disposition: A | Discharge status: Home / Self Care | Payer: Medicare Other | Attending: Emergency Medicine | Admitting: Emergency Medicine

## 2020-12-21 ENCOUNTER — Emergency Department (HOSPITAL_BASED_OUTPATIENT_CLINIC_OR_DEPARTMENT_OTHER): Payer: Medicare Other

## 2020-12-21 ENCOUNTER — Other Ambulatory Visit: Payer: Self-pay

## 2020-12-21 DIAGNOSIS — M79632 Pain in left forearm: Secondary | ICD-10-CM | POA: Insufficient documentation

## 2020-12-21 DIAGNOSIS — I2510 Atherosclerotic heart disease of native coronary artery without angina pectoris: Secondary | ICD-10-CM | POA: Diagnosis not present

## 2020-12-21 DIAGNOSIS — R079 Chest pain, unspecified: Secondary | ICD-10-CM | POA: Insufficient documentation

## 2020-12-21 DIAGNOSIS — I10 Essential (primary) hypertension: Secondary | ICD-10-CM | POA: Diagnosis not present

## 2020-12-21 DIAGNOSIS — Z87891 Personal history of nicotine dependence: Secondary | ICD-10-CM | POA: Insufficient documentation

## 2020-12-21 DIAGNOSIS — Z955 Presence of coronary angioplasty implant and graft: Secondary | ICD-10-CM | POA: Insufficient documentation

## 2020-12-21 DIAGNOSIS — M546 Pain in thoracic spine: Secondary | ICD-10-CM | POA: Insufficient documentation

## 2020-12-21 DIAGNOSIS — Z96651 Presence of right artificial knee joint: Secondary | ICD-10-CM | POA: Insufficient documentation

## 2020-12-21 DIAGNOSIS — Z7982 Long term (current) use of aspirin: Secondary | ICD-10-CM | POA: Insufficient documentation

## 2020-12-21 DIAGNOSIS — Z79899 Other long term (current) drug therapy: Secondary | ICD-10-CM | POA: Insufficient documentation

## 2020-12-21 DIAGNOSIS — R110 Nausea: Secondary | ICD-10-CM | POA: Diagnosis not present

## 2020-12-21 LAB — CBC WITH DIFFERENTIAL/PLATELET
Abs Immature Granulocytes: 0.03 10*3/uL (ref 0.00–0.07)
Basophils Absolute: 0 10*3/uL (ref 0.0–0.1)
Basophils Relative: 1 %
Eosinophils Absolute: 0 10*3/uL (ref 0.0–0.5)
Eosinophils Relative: 0 %
HCT: 43 % (ref 39.0–52.0)
Hemoglobin: 14.5 g/dL (ref 13.0–17.0)
Immature Granulocytes: 0 %
Lymphocytes Relative: 18 %
Lymphs Abs: 1.3 10*3/uL (ref 0.7–4.0)
MCH: 32.1 pg (ref 26.0–34.0)
MCHC: 33.7 g/dL (ref 30.0–36.0)
MCV: 95.1 fL (ref 80.0–100.0)
Monocytes Absolute: 0.5 10*3/uL (ref 0.1–1.0)
Monocytes Relative: 7 %
Neutro Abs: 5.3 10*3/uL (ref 1.7–7.7)
Neutrophils Relative %: 74 %
Platelets: 159 10*3/uL (ref 150–400)
RBC: 4.52 MIL/uL (ref 4.22–5.81)
RDW: 12.2 % (ref 11.5–15.5)
WBC: 7.2 10*3/uL (ref 4.0–10.5)
nRBC: 0 % (ref 0.0–0.2)

## 2020-12-21 LAB — BASIC METABOLIC PANEL
Anion gap: 6 (ref 5–15)
BUN: 24 mg/dL — ABNORMAL HIGH (ref 8–23)
CO2: 28 mmol/L (ref 22–32)
Calcium: 8.9 mg/dL (ref 8.9–10.3)
Chloride: 104 mmol/L (ref 98–111)
Creatinine, Ser: 0.87 mg/dL (ref 0.61–1.24)
GFR, Estimated: >60 mL/min (ref >60)
Glucose, Bld: 123 mg/dL — ABNORMAL HIGH (ref 70–99)
Potassium: 4.4 mmol/L (ref 3.5–5.1)
Sodium: 138 mmol/L (ref 135–145)

## 2020-12-21 LAB — TROPONIN I (HIGH SENSITIVITY): Troponin I (High Sensitivity): 21 ng/L — ABNORMAL HIGH (ref <18)

## 2020-12-21 IMAGING — DX DG CHEST 1V PORT
1 series · 1 of 1 positions shown · non-contrast
Comparison: Portable exam [AQ] hours compared to [DATE]

CLINICAL DATA: Back pain, cramping

EXAM:
PORTABLE CHEST 1 VIEW

[chest ap]
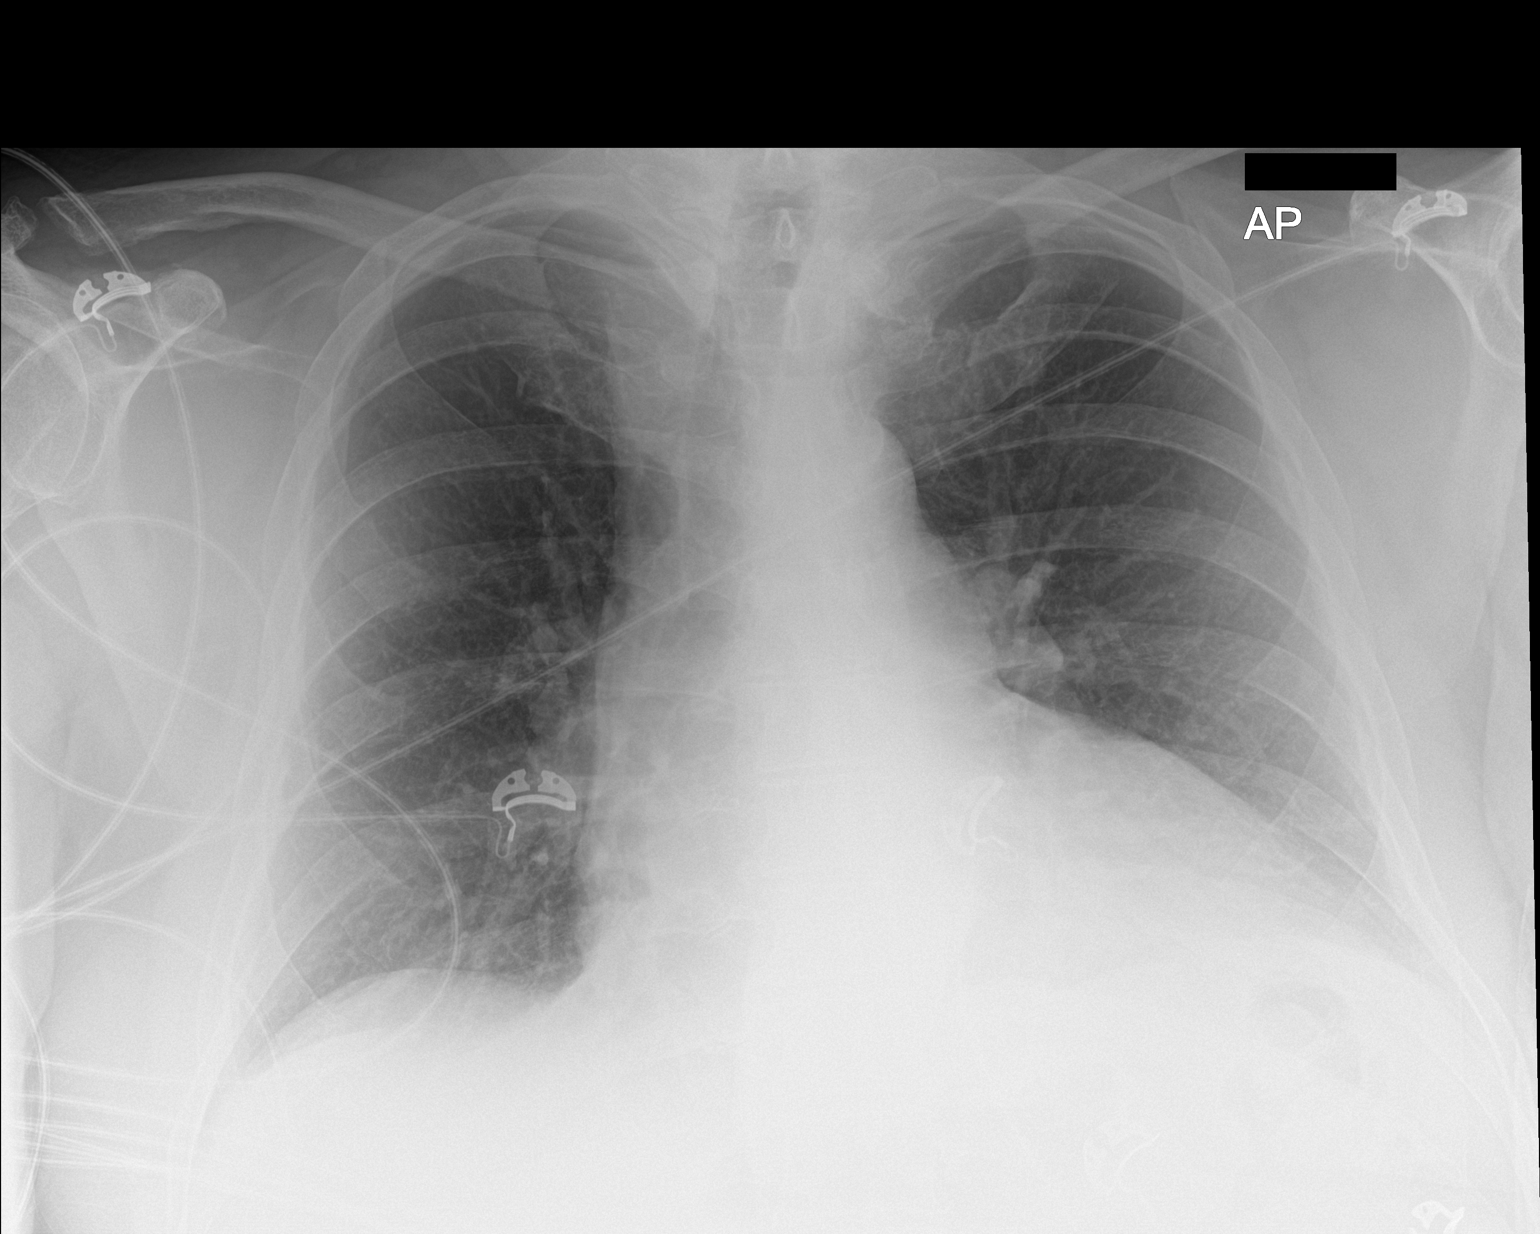

[1 of 1 positions shown; findings below may reference images not displayed]

FINDINGS: Enlargement of cardiac silhouette.

Mediastinal contours and pulmonary vascularity normal.

Lungs clear.

No infiltrate, pleural effusion, or pneumothorax.
IMPRESSION: No acute abnormalities.

## 2020-12-21 MED ORDER — METHOCARBAMOL 500 MG PO TABS
500.0000 mg | ORAL_TABLET | Freq: Once | ORAL | Status: AC
  Administered 2020-12-21: 500 mg via ORAL
  Filled 2020-12-21: qty 1

## 2020-12-21 MED ORDER — KETOROLAC TROMETHAMINE 15 MG/ML IJ SOLN
15.0000 mg | Freq: Once | INTRAMUSCULAR | Status: AC
  Administered 2020-12-21: 15 mg via INTRAVENOUS
  Filled 2020-12-21: qty 1

## 2020-12-21 MED ORDER — PREDNISONE 20 MG PO TABS: 40.0000 mg | ORAL_TABLET | Freq: Every day | ORAL | 0 refills | Status: DC

## 2020-12-21 MED ORDER — METHOCARBAMOL 500 MG PO TABS: 500.0000 mg | ORAL_TABLET | Freq: Three times a day (TID) | ORAL | 0 refills | Status: DC | PRN

## 2020-12-21 NOTE — ED Provider Notes (Signed)
[facility] EMERGENCY DEPARTMENT Provider Note   CSN: [id number] Arrival date & time: 12/21/20  3016     History Chief Complaint  Patient presents with   Back Pain    Arm pain    Alec Johnson is a 70 y.o. male.   Back Pain Associated symptoms: chest pain   Associated symptoms: no abdominal pain and no weakness   Patient presents with left upper back pain.  Also pain in left forearm going down to the hand.  Began this morning.  States feels crampy.  Has had episodes on and off for the last month.  Not exertional.  States he did not do anything physical that bothered him yesterday.  Did walk around and do his normal physical activity without any pain.  Has had a previous STEMI but states this does not feel like that.  Saw PCP and was sent in here for further work-up.  No trauma.  No numbness.  States he did feel nauseous with the event due to the pain.  States he took Tylenol and promethazine.  States he thought the promethazine was a muscle relaxer.  No trauma.  States there is a point on his back where it hurts.    Past Medical History:  Diagnosis Date   Colon polyp    Diverticular disease    Erectile dysfunction    GERD (gastroesophageal reflux disease)    NO Recent problems   Hyperlipidemia    Osteoarthritis    Seborrheic keratosis    STEMI (ST elevation myocardial infarction) ([place])    10/19/17 PCI/DES to ramus with CTO of the mRCA with collaterals, normal EF    Patient Active Problem List   Diagnosis Date Noted   Chest pain of uncertain etiology 01/28/3233   Dyspnea 11/26/2017   CAD S/P RI PCI 10/29/2017   Essential hypertension 10/29/2017   History of ST elevation myocardial infarction (STEMI) 10/19/2017   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   Rash and nonspecific skin eruption 04/30/2013   Cellulitis of finger of left hand 04/30/2013   Routine general medical examination at a health care facility 01/22/2013   BPH (benign prostatic hypertrophy)  11/28/2012   Need for prophylactic vaccination and inoculation against influenza 11/28/2012   Bradycardia 10/01/2011   Tobacco abuse 10/01/2011   Diverticular disease    Neck pain    Dyslipidemia, goal LDL below 70    Allergic rhinitis    Allergic sinusitis    GERD (gastroesophageal reflux disease)    Seborrheic keratosis    Osteoarthritis    Erectile dysfunction    Colon polyp     Past Surgical History:  Procedure Laterality Date   athroscopic knee surgery     BIL KNEES   CORONARY/GRAFT ACUTE MI REVASCULARIZATION N/A 10/19/2017   Procedure: Coronary/Graft Acute MI Revascularization;  Surgeon: Jettie Booze, MD;  Location: [facility];  Service: Cardiovascular;  Laterality: N/A;   LEFT HEART CATH AND CORONARY ANGIOGRAPHY N/A 10/19/2017   Procedure: LEFT HEART CATH AND CORONARY ANGIOGRAPHY;  Surgeon: Jettie Booze, MD;  Location: [facility];  Service: Cardiovascular;  Laterality: N/A;   LEG SURGERY  1966   PINNING DUE TO INJURY   NOSE SURGERY     TOTAL KNEE ARTHROPLASTY Right 01/18/2014   Procedure: RIGHT TOTAL KNEE ARTHROPLASTY;  Surgeon: Tobi Bastos, MD;  Location: [facility];  Service: Orthopedics;  Laterality: Right;       Family History  Problem Relation Age of Onset  Coronary artery disease Brother        MI   Heart attack Brother    Stroke Mother    Cancer Father        Lung Cancer   Cancer Paternal Grandfather        Lung Cancer   Heart attack Paternal Grandfather     Social History   Tobacco Use   Smoking status: Former    Packs/day: 2.00    Years: 45.00    Pack years: 90.00    Types: Cigarettes    Quit date: 01/11/2012    Years since quitting: 8.9   Smokeless tobacco: Never   Tobacco comments:    He has quit smoking.    Substance Use Topics   Alcohol use: Yes    Comment: Occasional   Drug use: No    Home Medications Prior to Admission medications   Medication Sig Start Date End Date Taking? Authorizing Provider   methocarbamol (ROBAXIN) 500 MG tablet Take 1 tablet (500 mg total) by mouth every 8 (eight) hours as needed for muscle spasms. 12/21/20  Yes Davonna Belling, MD  predniSONE (DELTASONE) 20 MG tablet Take 2 tablets (40 mg total) by mouth daily. 12/21/20  Yes Davonna Belling, MD  acetaminophen (TYLENOL) 500 MG tablet Take 1,000 mg by mouth every 6 (six) hours as needed for mild pain or moderate pain.    [provider]  aspirin EC 81 MG tablet Take 81 mg by mouth daily.    [provider]  atorvastatin (LIPITOR) 80 MG tablet TAKE 1 TABLET BY MOUTH  DAILY AT 6 PM 03/21/20   Buford Dresser, MD  Cholecalciferol (VITAMIN D3) 5000 units TABS Take 5,000 Units by mouth 3 (three) times a week.     [provider]  COVID-19 mRNA bivalent vaccine, Pfizer, (PFIZER COVID-19 VAC BIVALENT) injection Inject into the muscle. 11/05/20   Carlyle Basques, MD  ezetimibe (ZETIA) 10 MG tablet TAKE 1 TABLET BY MOUTH  DAILY 11/17/19   Lelon Perla, MD  fluticasone [facility]) 50 MCG/ACT nasal spray Place 2 sprays into both nostrils daily. 09/21/13 08/01/20  Jonathon Resides, MD  Glucosamine HCl (GLUCOSAMINE PO) Take 500 mg by mouth daily.    [provider]  influenza vaccine adjuvanted (FLUAD) 0.5 ML injection Inject into the muscle. 10/18/20   Carlyle Basques, MD  lisinopril (ZESTRIL) 10 MG tablet TAKE 1 TABLET BY MOUTH  DAILY 10/05/20   Lelon Perla, MD  meloxicam (MOBIC) 15 MG tablet Take 15 mg by mouth daily. 03/10/19   [provider]  nitroGLYCERIN (NITROSTAT) 0.4 MG SL tablet Place 1 tablet (0.4 mg total) under the tongue every 5 (five) minutes x 3 doses as needed for chest pain. 12/27/19   Buford Dresser, MD    Allergies    Doxycycline hyclate, Penicillins, and Rifampin  Review of Systems   Review of Systems  Constitutional:  Negative for appetite change.  HENT:  Negative for congestion.   Respiratory:  Negative for shortness of breath.    Cardiovascular:  Positive for chest pain.  Gastrointestinal:  Positive for nausea. Negative for abdominal pain.  Genitourinary:  Negative for flank pain.  Musculoskeletal:  Positive for back pain.       Left forearm pain.  Skin:  Negative for wound.  Neurological:  Negative for weakness and light-headedness.  Psychiatric/Behavioral:  Negative for confusion.    Physical Exam Updated Vital Signs BP (!) 124/57   Pulse (!) 43   Temp 98.1  F (36.7 C) (Oral)   Resp 17   Ht 5\' 10"  (1.778 m)   Wt 107 kg   SpO2 97%   BMI 33.86 kg/m   Physical Exam Vitals and nursing note reviewed.  Constitutional:      Appearance: Normal appearance.     Comments: Patient is sitting in the bed.  Holding his left forearm with his right hand.  HENT:     Head: Atraumatic.  Eyes:     Pupils: Pupils are equal, round, and reactive to light.  Cardiovascular:     Rate and Rhythm: Regular rhythm.  Pulmonary:     Comments: Tenderness to posterior lateral left chest wall lateral to scapula.  No rash.  No tenderness over cervical spine.   Chest:     Chest wall: Tenderness present.  Abdominal:     Tenderness: There is no abdominal tenderness.  Musculoskeletal:        General: Tenderness present.     Comments: Tenderness to mid left forearm.  Some tenderness over right elbow medially also.  Radial pulse intact.  Sensation grossly intact in hand.  Skin:    General: Skin is warm.     Capillary Refill: Capillary refill takes less than 2 seconds.  Neurological:     Mental Status: He is alert.    ED Results / Procedures / Treatments   Labs (all labs ordered are listed, but only abnormal results are displayed) Labs Reviewed  BASIC METABOLIC PANEL - Abnormal; Notable for the following components:      Result Value   Glucose, Bld 123 (*)    BUN 24 (*)    All other components within normal limits  TROPONIN I (HIGH SENSITIVITY) - Abnormal; Notable for the following components:   Troponin I (High  Sensitivity) 21 (*)    All other components within normal limits  CBC WITH DIFFERENTIAL/PLATELET    EKG EKG Interpretation  Date/Time:  Friday December 21 2020 09:04:22 EST Ventricular Rate:  45 PR Interval:  142 QRS Duration: 103 QT Interval:  459 QTC Calculation: 398 R Axis:   -19 Text Interpretation: Sinus bradycardia Abnormal R-wave progression, early transition Left ventricular hypertrophy Confirmed by Davonna Belling [phone]) on 12/21/2020 9:26:53 AM  Radiology DG Chest Portable 1 View  Result Date: 12/21/2020 CLINICAL DATA:  Back pain, cramping EXAM: PORTABLE CHEST 1 VIEW COMPARISON:  Portable exam 0935 hours compared to 12/27/2019 FINDINGS: Enlargement of cardiac silhouette. Mediastinal contours and pulmonary vascularity normal. Lungs clear. No infiltrate, pleural effusion, or pneumothorax. IMPRESSION: No acute abnormalities. Electronically Signed   By: Lavonia Dana M.D.   On: 12/21/2020 10:02    Procedures Procedures   Medications Ordered in ED Medications  ketorolac (TORADOL) 15 MG/ML injection 15 mg (15 mg Intravenous Given 12/21/20 0953)  methocarbamol (ROBAXIN) tablet 500 mg (500 mg Oral Given 12/21/20 4034)    ED Course  I have reviewed the triage vital signs and the nursing notes.  Pertinent labs & imaging results that were available during my care of the patient were reviewed by me and considered in my medical decision making (see chart for details).    MDM Rules/Calculators/A&P                           Patient with left arm and back pain.  Reproducible on back.  Reproducible on forearm.  Does have some tingling/numbness on left fifth finger and half of the fourth finger.  Likely ulnar nerve source  of that.  No neck tenderness.  Does have previous cardiac history.  Troponin mildly elevated at 20 but this appears to be his baseline.  Would have like to repeat it but patient is actually eager to leave.  Not willing to stay.  I think overall low risk that this is  stable from previous troponins.  We will treat symptomatically with muscle relaxers and add some steroids.  We will follow-up as an outpatient.  Aortic dissection or pulm embolism felt less likely.  Doubt cardiac cause. Final Clinical Impression(s) / ED Diagnoses Final diagnoses:  Acute left-sided thoracic back pain  Pain of left forearm    Rx / DC Orders ED Discharge Orders          Ordered    methocarbamol (ROBAXIN) 500 MG tablet  Every 8 hours PRN        12/21/20 1104    predniSONE (DELTASONE) 20 MG tablet  Daily        12/21/20 1104             Davonna Belling, MD 12/21/20 1526

## 2020-12-21 NOTE — ED Triage Notes (Signed)
Pt states awoke with back pain, cramping, muscle spasm in left arm, has been having similar issues for past month, OTC ointment resolved.  Saw pmd this morning who recommended to go to ER for eval due to cardiac history

## 2020-12-25 ENCOUNTER — Other Ambulatory Visit: Payer: Self-pay | Admitting: Cardiology

## 2020-12-25 DIAGNOSIS — E785 Hyperlipidemia, unspecified: Secondary | ICD-10-CM

## 2021-01-04 ENCOUNTER — Other Ambulatory Visit: Payer: Self-pay | Admitting: *Deleted

## 2021-01-04 DIAGNOSIS — Z87891 Personal history of nicotine dependence: Secondary | ICD-10-CM

## 2021-01-15 ENCOUNTER — Other Ambulatory Visit: Payer: Self-pay

## 2021-01-15 ENCOUNTER — Ambulatory Visit (HOSPITAL_BASED_OUTPATIENT_CLINIC_OR_DEPARTMENT_OTHER)
Admission: RE | Admit: 2021-01-15 | Discharge: 2021-01-15 | Disposition: A | Discharge status: Home / Self Care | Payer: Medicare Other | Source: Ambulatory Visit | Attending: Family Medicine | Admitting: Family Medicine

## 2021-01-15 DIAGNOSIS — Z87891 Personal history of nicotine dependence: Secondary | ICD-10-CM | POA: Insufficient documentation

## 2021-01-15 IMAGING — CT CT CHEST LUNG CANCER SCREENING LOW DOSE W/O CM
2 of 4 series · 15 of 40 positions shown, 18 images · non-contrast
Comparison: CT lung cancer screening dated [DATE]

CLINICAL DATA: Former smoker with 92 pack-year history

EXAM:
CT CHEST WITHOUT CONTRAST LOW-DOSE FOR LUNG CANCER SCREENING
TECHNIQUE: Multidetector CT imaging of the chest was performed following the
standard protocol without IV contrast.

[Series 2: axial st · axial · 0.79mm/px · z∈[-329,-69]mm · 12 of 62 slices shown, 15 images]
[im 5/62  mediastinal]
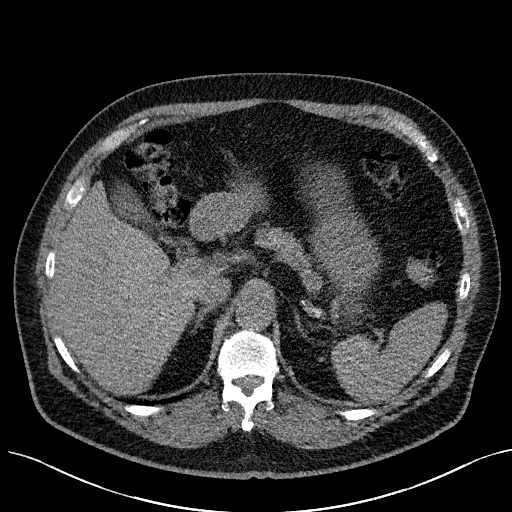
[im 5/62  lung]
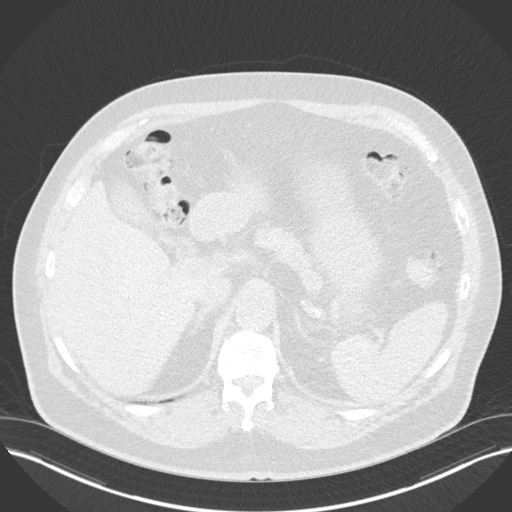
[im 10/62  lung]
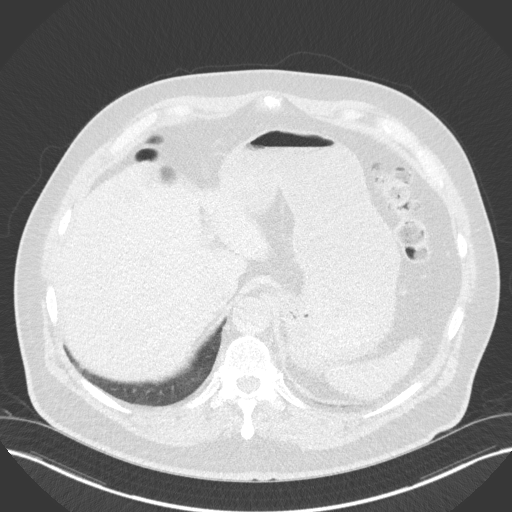
[im 15/62  lung]
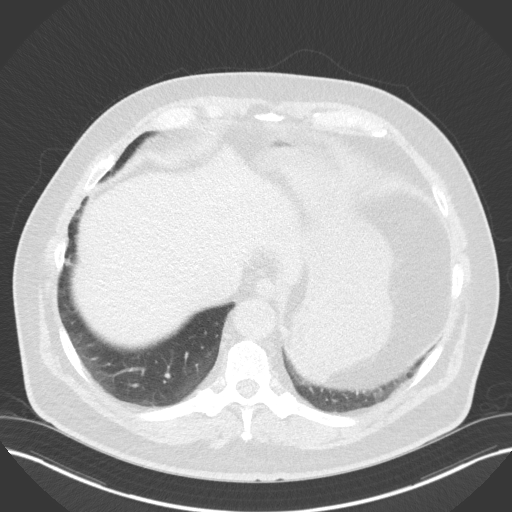
[im 19/62  lung]
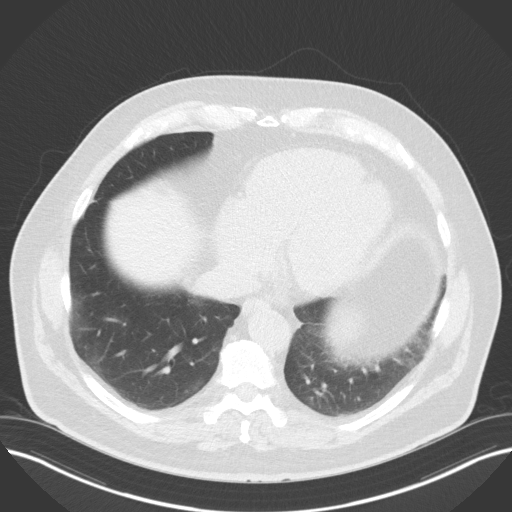
[im 24/62  mediastinal]
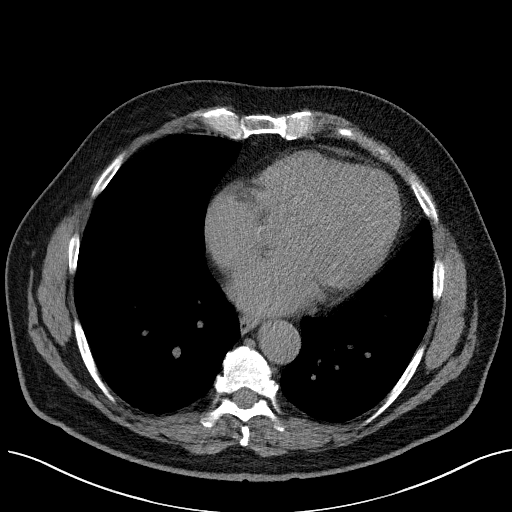
[im 24/62  lung]
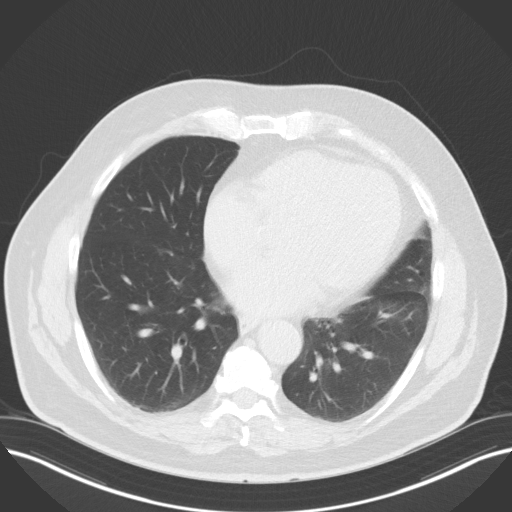
[im 29/62  lung]
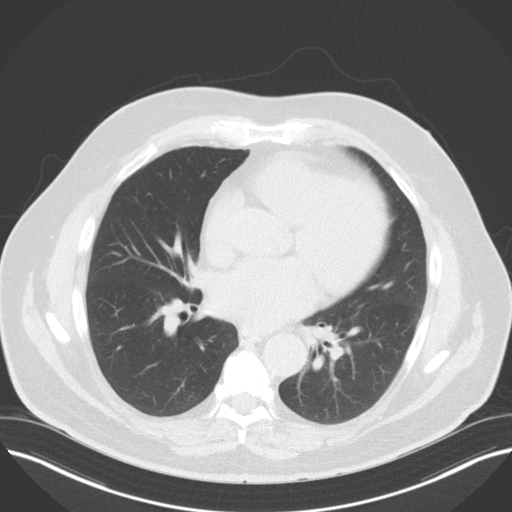
[im 33/62  lung]
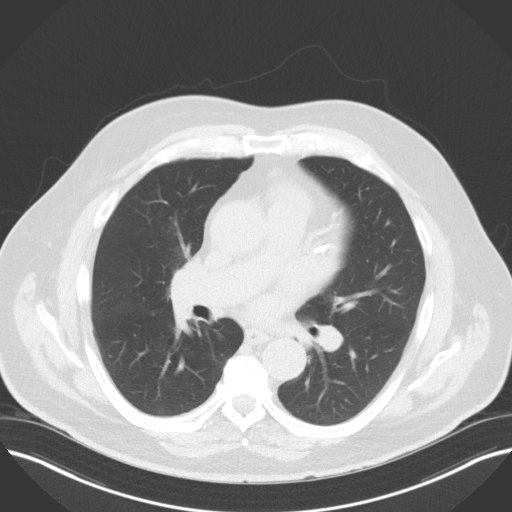
[im 38/62  lung]
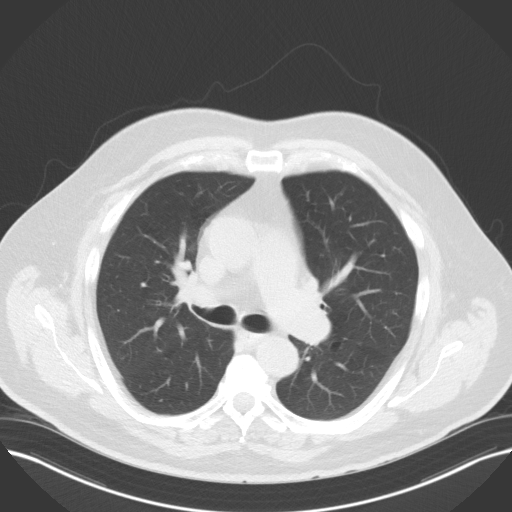
[im 43/62  mediastinal]
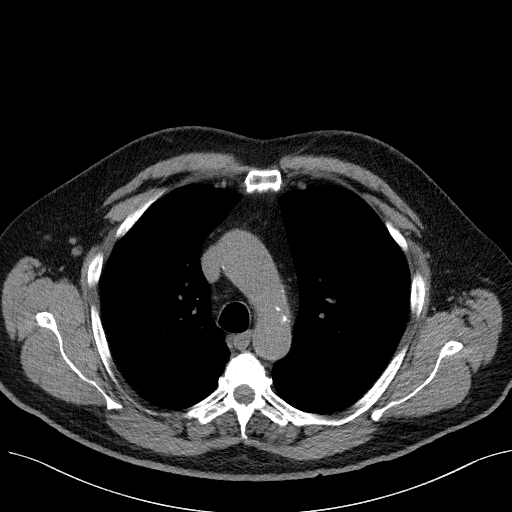
[im 43/62  lung]
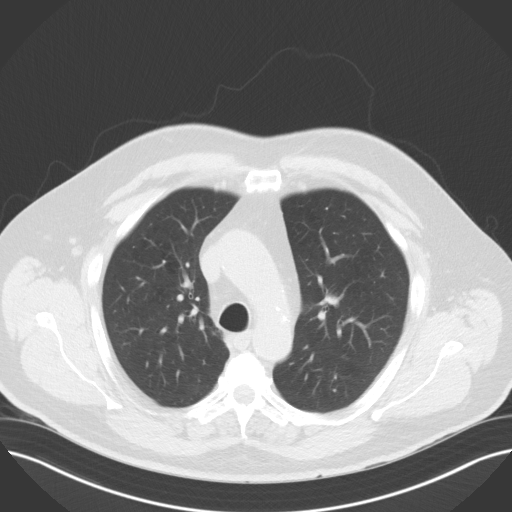
[im 47/62  lung]
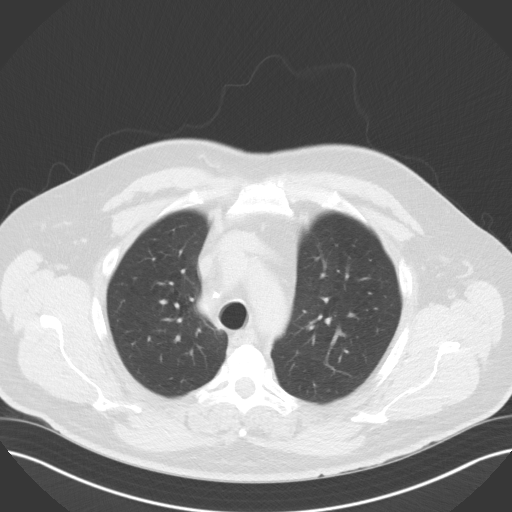
[im 52/62  lung]
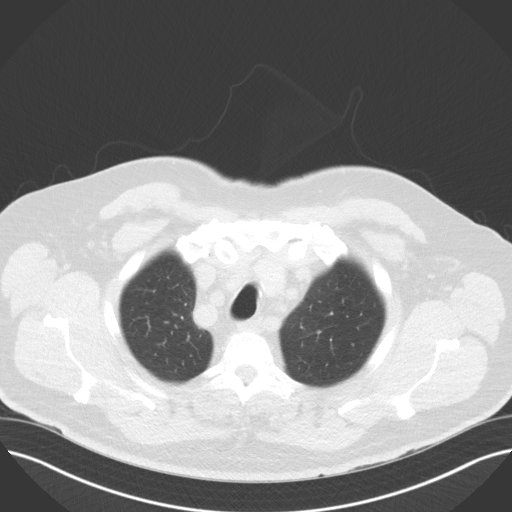
[im 57/62  lung]
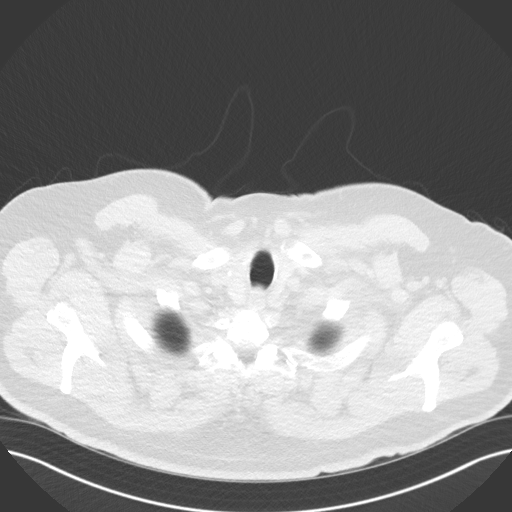

[Series 5: coronal · coronal · 0.65mm/px · 3 of 297 slices shown]
[im 60/297  lung]
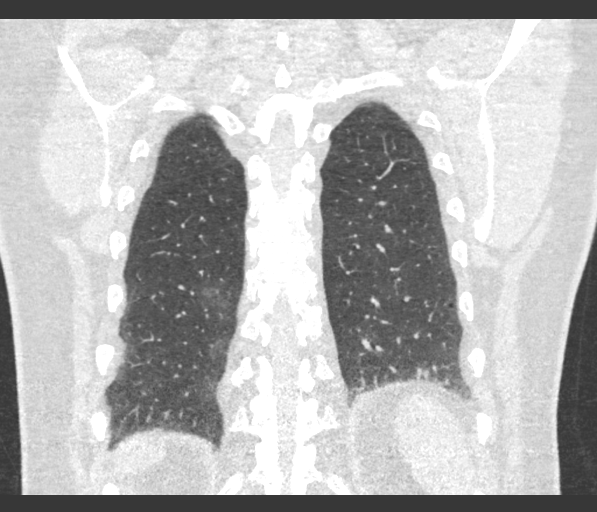
[im 119/297  lung]
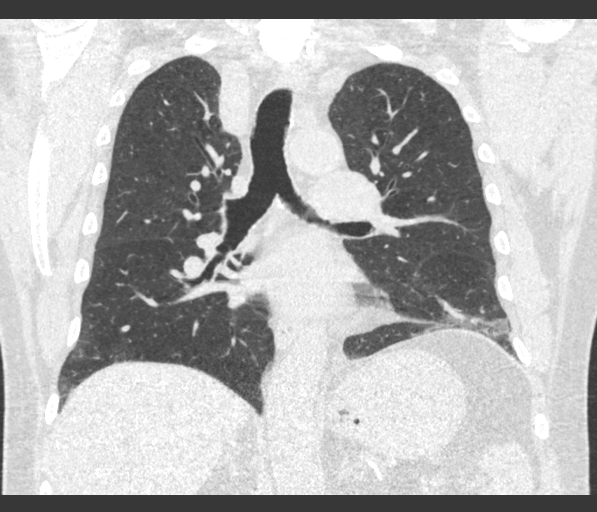
[im 178/297  lung]
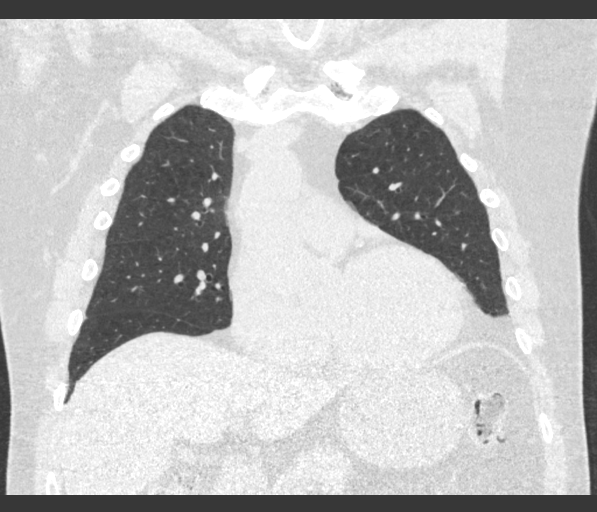

[15 of 40 positions shown; findings below may reference images not displayed]

FINDINGS: Cardiovascular: Cardiomegaly. No pericardial effusion. Coronary
artery calcifications of the LAD and RCA. Atherosclerotic disease of
the thoracic aorta.

Mediastinum/Nodes: Esophagus is unremarkable. No pathologically
enlarged lymph nodes seen in the chest.

Lungs/Pleura: Central airways are patent. Centrilobular emphysema.
Left lower lobe atelectasis. No consolidation, pleural effusion or
pneumothorax. Stable solid pulmonary nodules. Reference nodule of
the right upper lobe measuring 4.3 mm on image 157.

Upper Abdomen: No acute abnormality.

Musculoskeletal: No chest wall mass or suspicious bone lesions
identified.
IMPRESSION: 1. Lung-RADS 2, benign appearance or behavior. Continue annual
screening with low-dose chest CT without contrast in 12 months.
2. Coronary artery calcifications.
3. Aortic Atherosclerosis ([8T]-[8T]) and Emphysema ([8T]-[8T]).

## 2021-01-17 ENCOUNTER — Other Ambulatory Visit: Payer: Self-pay

## 2021-01-17 DIAGNOSIS — Z87891 Personal history of nicotine dependence: Secondary | ICD-10-CM

## 2021-02-25 ENCOUNTER — Other Ambulatory Visit: Payer: Self-pay | Admitting: Cardiology

## 2021-02-26 NOTE — Telephone Encounter (Signed)
Rx(s) sent to pharmacy electronically.  

## 2021-03-18 ENCOUNTER — Other Ambulatory Visit (HOSPITAL_BASED_OUTPATIENT_CLINIC_OR_DEPARTMENT_OTHER): Payer: Self-pay | Admitting: Neurological Surgery

## 2021-03-18 DIAGNOSIS — M5412 Radiculopathy, cervical region: Secondary | ICD-10-CM

## 2021-03-23 ENCOUNTER — Other Ambulatory Visit: Payer: Self-pay

## 2021-03-23 ENCOUNTER — Ambulatory Visit (HOSPITAL_BASED_OUTPATIENT_CLINIC_OR_DEPARTMENT_OTHER)
Admission: RE | Admit: 2021-03-23 | Discharge: 2021-03-23 | Disposition: A | Discharge status: Home / Self Care | Payer: Medicare Other | Source: Ambulatory Visit | Attending: Neurological Surgery | Admitting: Neurological Surgery

## 2021-03-23 DIAGNOSIS — M5412 Radiculopathy, cervical region: Secondary | ICD-10-CM | POA: Diagnosis present

## 2021-03-23 IMAGING — MR MR CERVICAL SPINE W/O CM
4 of 5 series · 28 of 48 positions shown · non-contrast
Comparison: Outside cervical spine MRI [DATE]. Intraoperative
images [REDACTED] [HOSPITAL] [HOSPITAL]
[DATE].

CLINICAL DATA: 70-year-old male status post posterior cervical
spine surgery last month. Left hand numbness. Limited range of
motion.

EXAM:
MRI CERVICAL SPINE WITHOUT CONTRAST
TECHNIQUE: Multiplanar, multisequence MR imaging of the cervical spine was
performed. No intravenous contrast was administered.

[Series 2: T2 · sagittal · 3.0mm · 0.69mm/px · 6 of 16 slices shown (1 of 3)]
[im 1/16]
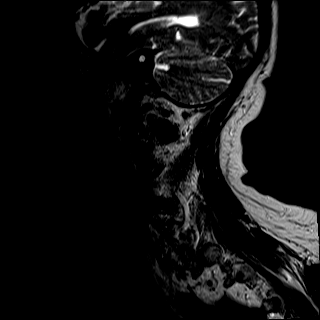
[im 4/16]
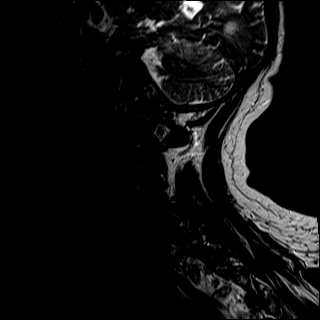
[im 7/16]
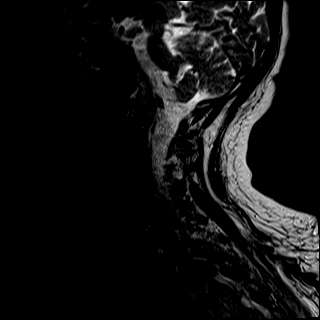
[im 10/16]
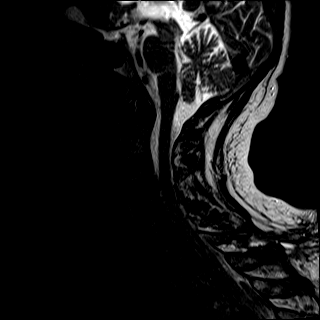
[im 13/16]
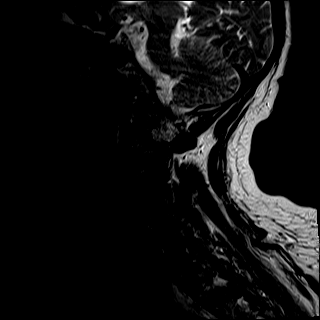
[im 16/16]
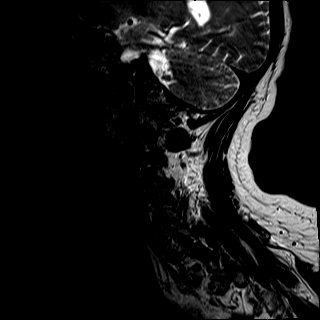

[Series 3: T1 · sagittal · 3.0mm · 0.69mm/px · 4 of 16 slices shown]
[im 1/16]
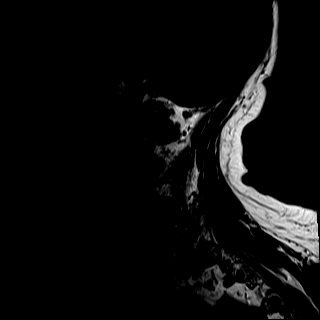
[im 4/16]
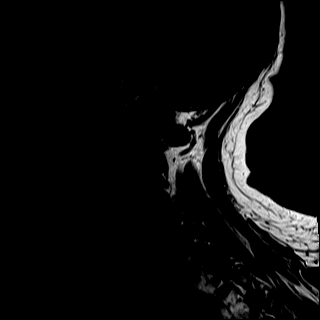
[im 10/16]
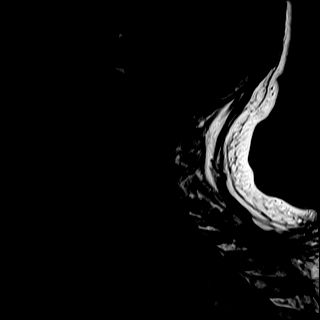
[im 16/16]
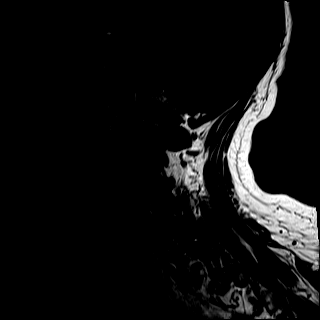

[Series 5: T2 · axial · 3.0mm · 0.62mm/px · z∈[-88,+46]mm · 9 of 40 slices shown (2 of 3)]
[im 1/40]
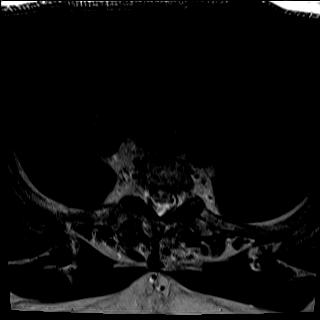
[im 6/40]
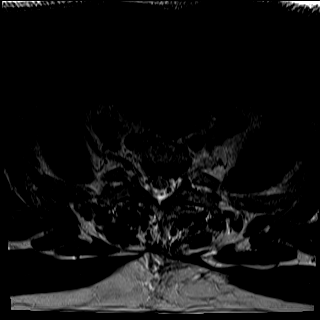
[im 12/40]
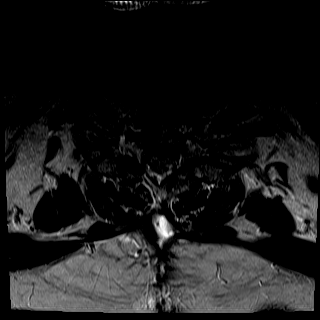
[im 17/40]
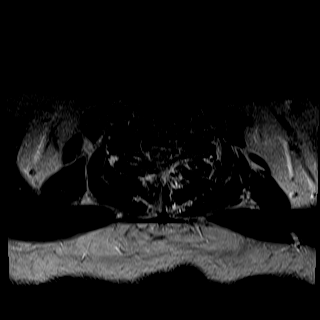
[im 20/40]
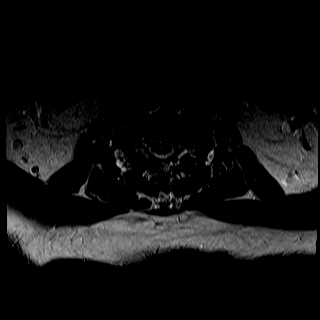
[im 23/40]
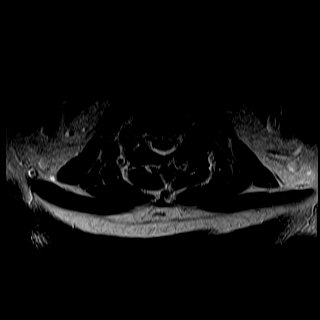
[im 28/40]
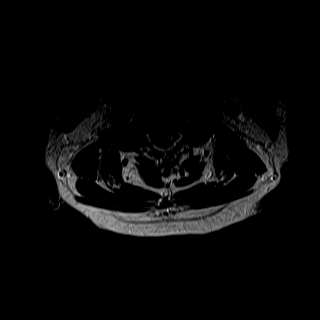
[im 34/40]
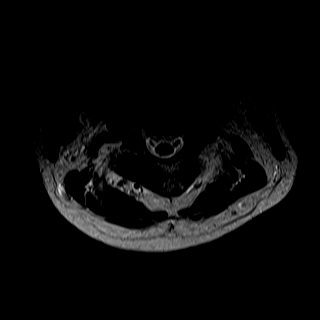
[im 40/40]
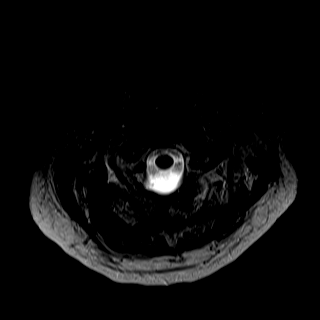

[Series 6: T2 · axial · 3.0mm · 0.39mm/px · z∈[-88,+46]mm · 9 of 40 slices shown (3 of 3)]
[im 1/40]
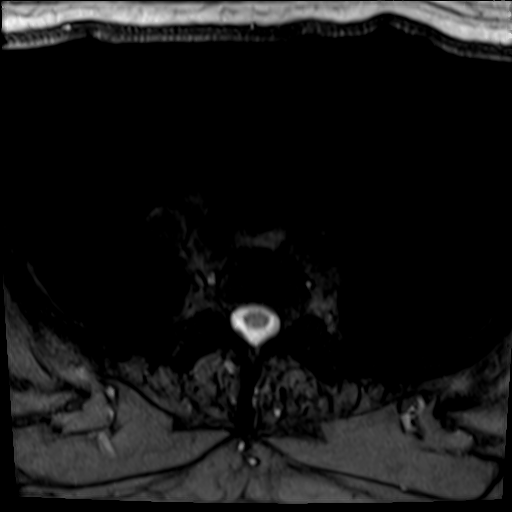
[im 6/40]
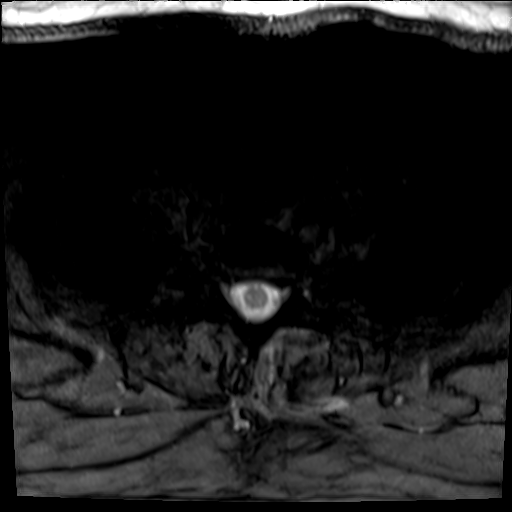
[im 12/40]
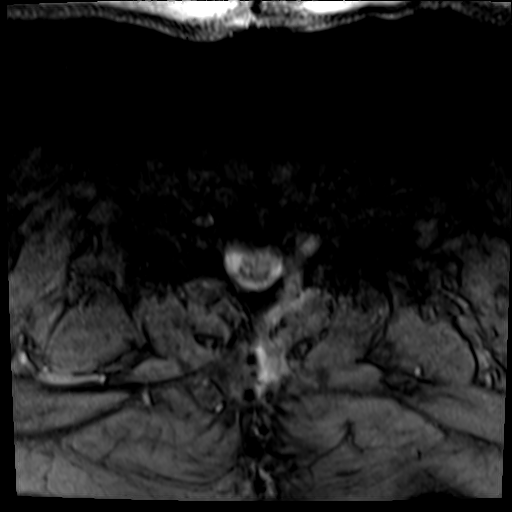
[im 17/40]
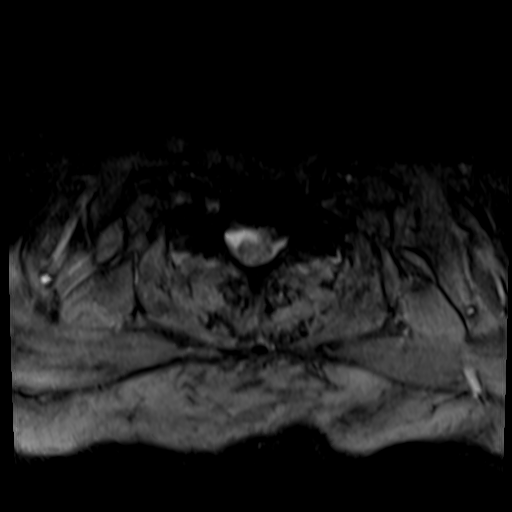
[im 20/40]
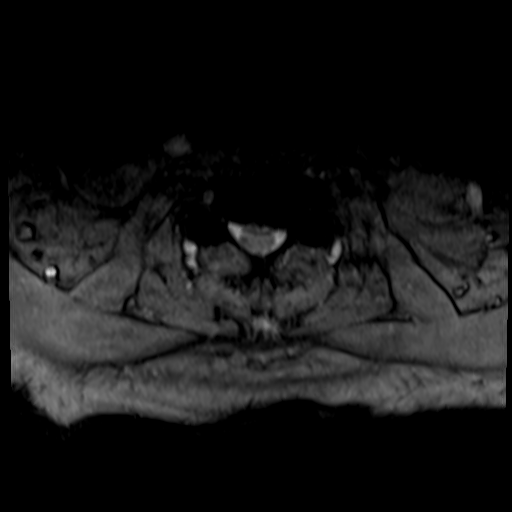
[im 23/40]
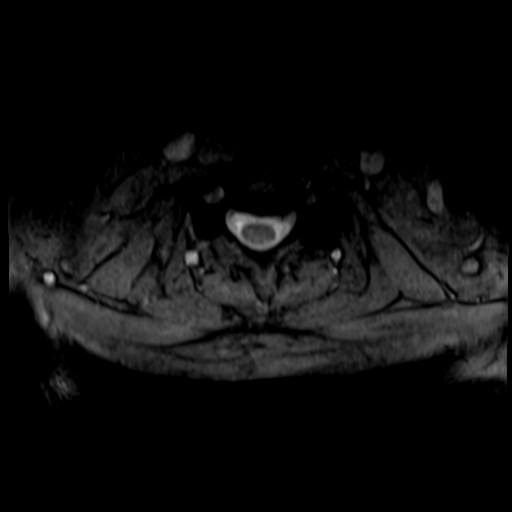
[im 28/40]
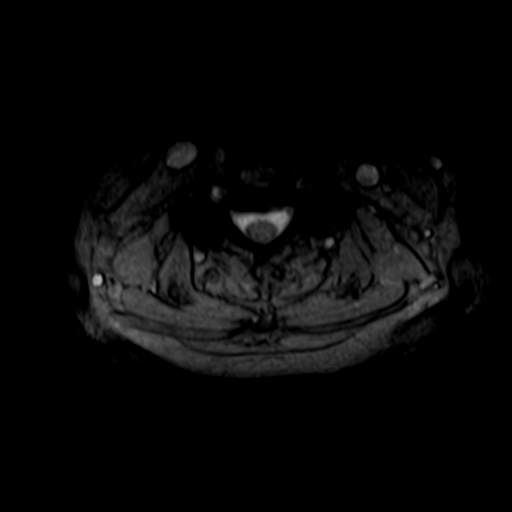
[im 34/40]
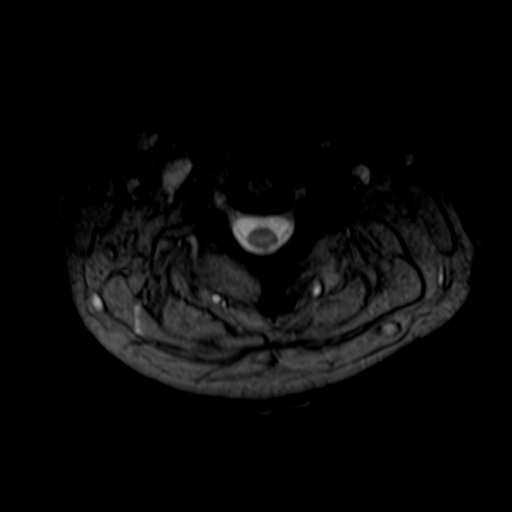
[im 40/40]
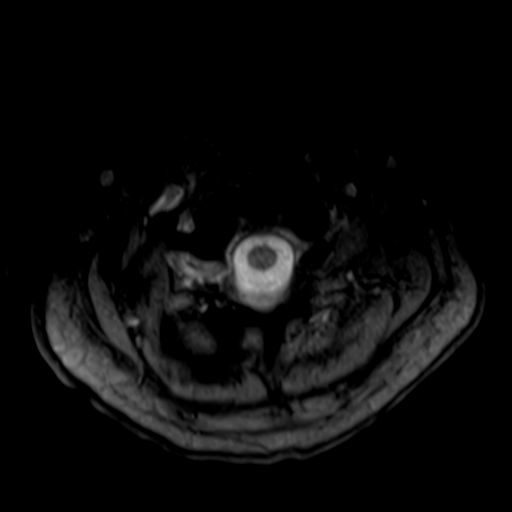

[28 of 48 positions shown; findings below may reference images not displayed]

FINDINGS: Alignment: Stable cervical lordosis since [REDACTED]. No
spondylolisthesis.

Vertebrae: No marrow edema or evidence of acute osseous abnormality.
Background bone marrow signal within normal limits.

Cord: No definite cord signal abnormality.

Posterior Fossa, vertebral arteries, paraspinal tissues: STIR
hyperintense fluid or edema tracking from the posterior skin surface
to the cervicothoracic junction posterior elements to the left of
midline (series 7, image 9 and series 6, image 28. Small discrete
relatively simple appearing fluid collection surrounding the left T1
spinous process (2.6 cm series 5, image 31).

Cervicomedullary junction is within normal limits. Negative visible
posterior fossa.

Questionable smooth 16 mm subfrontal mildly T2 hyperintense mass
along the planum sphenoidale series 2, image 7. Stable since
[REDACTED]. No definite cerebral edema or intracranial mass effect.

Preserved major vascular flow voids in the neck. Prevertebral soft
tissues are within normal limits. Negative visible lung apices.

Disc levels:

C2-C3:

Mild to moderate facet hypertrophy on the left. Stable mild to
moderate left C3 foraminal stenosis.

C3-C4: Chronic right foraminal disc osteophyte complex and moderate
facet hypertrophy greater on the right. No definite spinal stenosis,
stable severe right C4 foraminal stenosis

C4-C5: Moderate to severe facet hypertrophy greater on the left.
Left foraminal disc osteophyte complex. No significant spinal
stenosis. Stable severe left C5 foraminal stenosis.

C5-C6: Chronic disc space loss. Circumferential disc osteophyte
complex with broad-based posterior component. Mild facet
hypertrophy. Stable borderline to mild spinal stenosis, moderate
bilateral C6 foraminal stenosis.

C6-C7: Chronic disc space loss. Circumferential disc osteophyte
complex eccentric to the left. Mild facet hypertrophy. Stable
borderline to mild spinal stenosis, moderate to severe left C7
foraminal stenosis.

C7-T1: Suspected postoperative changes to the left lateral lamina
here (series 6, image 29). Disc extrusion into the left lateral
recess suggested on the preoperative exam. Architectural distortion
at the left lateral recess now (series 6, image 30). There remains
mild mass effect on the left hemicord. No increased spinal stenosis.
Postoperative signal changes appear to extend to the left C8 neural
foramen where patency may be improved.

T1-T2: Moderate facet hypertrophy greater on the left. No stenosis.
IMPRESSION: 1. Posterior approach postoperative changes tracking from the skin
surface to the left lateral recess at C7-T1. Small postoperative
seroma adjacent to the left T1 spinous process. Architectural
distortion at the C7-T1 left lateral recess with continued mild mass
effect on the left hemicord. No increased spinal stenosis. Possible
improved left C8 foraminal patency.

2. Stable combined cervical disc, endplate, and facet degeneration
elsewhere, with up to mild spinal stenosis at C5-C6 and C6-C7, and
up to severe neural foraminal stenosis at the right C4, left C5, and
left C7 nerve levels. No spinal cord signal abnormality identified.

3. Appearance suspicious for a small subfrontal meningioma along the
planum sphenoidale approximately 16 mm. Follow-up Brain MRI without
and with contrast would be confirmatory.

## 2021-05-28 ENCOUNTER — Other Ambulatory Visit (HOSPITAL_BASED_OUTPATIENT_CLINIC_OR_DEPARTMENT_OTHER): Payer: Self-pay | Admitting: Orthopedic Surgery

## 2021-05-28 DIAGNOSIS — M50122 Cervical disc disorder at C5-C6 level with radiculopathy: Secondary | ICD-10-CM

## 2021-06-01 ENCOUNTER — Ambulatory Visit (HOSPITAL_BASED_OUTPATIENT_CLINIC_OR_DEPARTMENT_OTHER)
Admission: RE | Admit: 2021-06-01 | Discharge: 2021-06-01 | Disposition: A | Discharge status: Home / Self Care | Payer: Medicare Other | Source: Ambulatory Visit | Attending: Orthopedic Surgery | Admitting: Orthopedic Surgery

## 2021-06-01 DIAGNOSIS — M50122 Cervical disc disorder at C5-C6 level with radiculopathy: Secondary | ICD-10-CM

## 2021-06-01 IMAGING — MR MR CERVICAL SPINE WO/W CM
6 of 9 series · 32 of 48 positions shown · IV contrast (gadavist)
Comparison: Postoperative cervical spine MRI [DATE], outside
cervical MRI [DATE].

CLINICAL DATA: 71-year-old male with 4 months of left hand
numbness. Status post posterior cervical spine surgery in [REDACTED].

EXAM:
MRI CERVICAL SPINE WITHOUT AND WITH CONTRAST
TECHNIQUE: Multiplanar and multiecho pulse sequences of the cervical spine, to
include the craniocervical junction and cervicothoracic junction,
were obtained without and with intravenous contrast.
CONTRAST:  10mL GADAVIST GADOBUTROL 1 MMOL/ML IV SOLN

[Series 3: T1 · sagittal · 3.0mm · 0.69mm/px · 4 of 16 slices shown (1 of 2)]
[im 1/16]
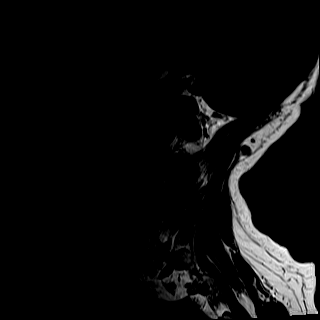
[im 6/16]
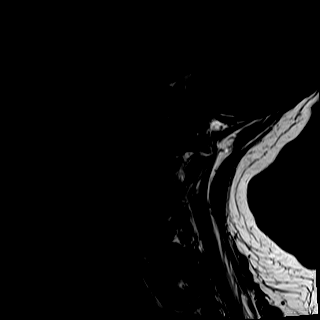
[im 11/16]
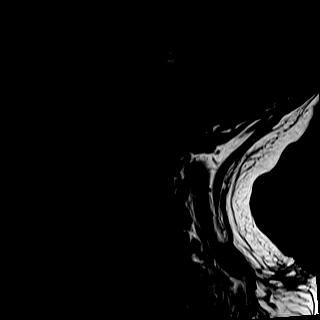
[im 16/16]
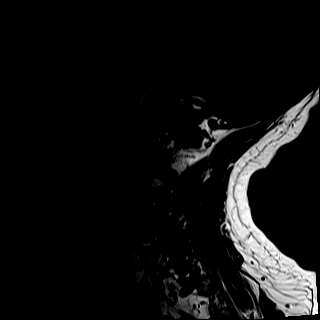

[Series 7: T2 · axial · 3.0mm · 0.62mm/px · z∈[-111,+42]mm · 8 of 48 slices shown (1 of 3)]
[im 1/48]
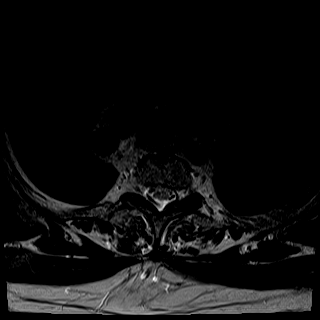
[im 7/48]
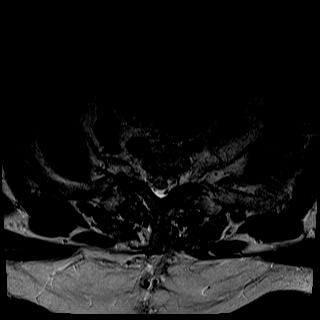
[im 14/48]
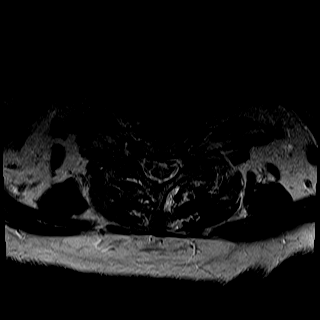
[im 21/48]
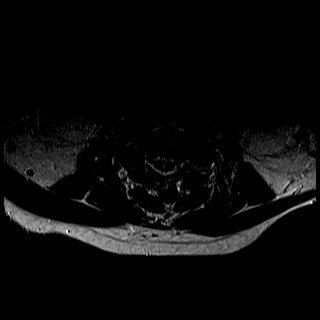
[im 27/48]
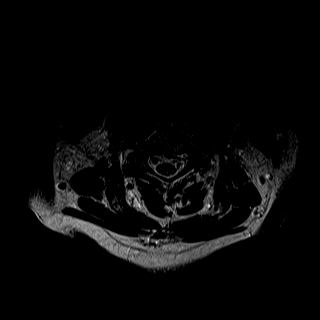
[im 34/48]
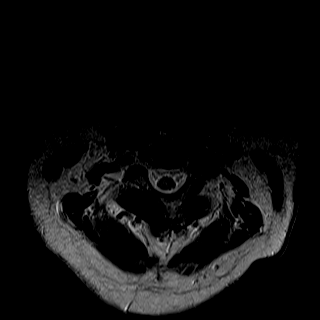
[im 41/48]
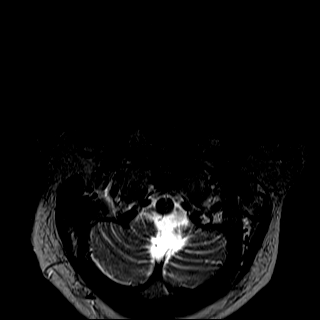
[im 48/48]
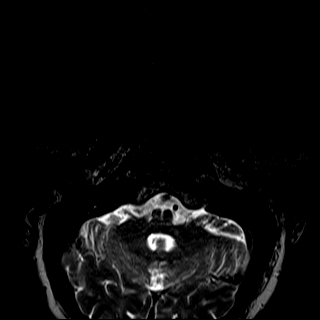

[Series 8: T2 · axial · 3.0mm · 0.39mm/px · z∈[-111,+42]mm · 8 of 48 slices shown (2 of 3)]
[im 1/48]
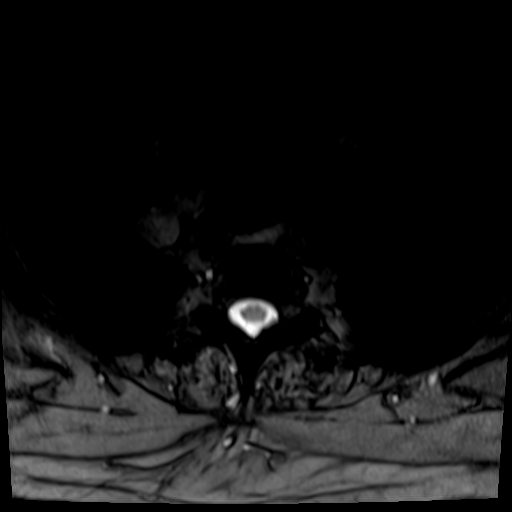
[im 7/48]
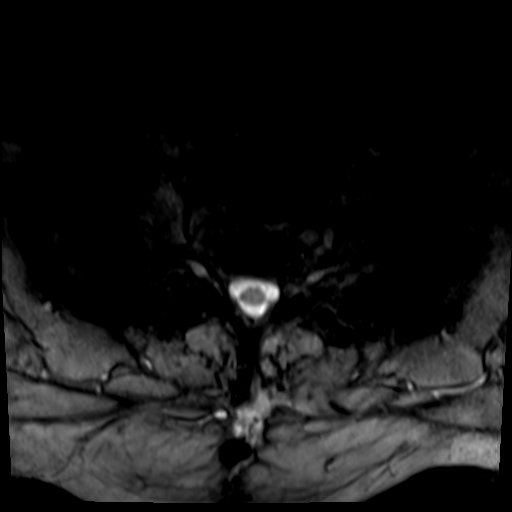
[im 14/48]
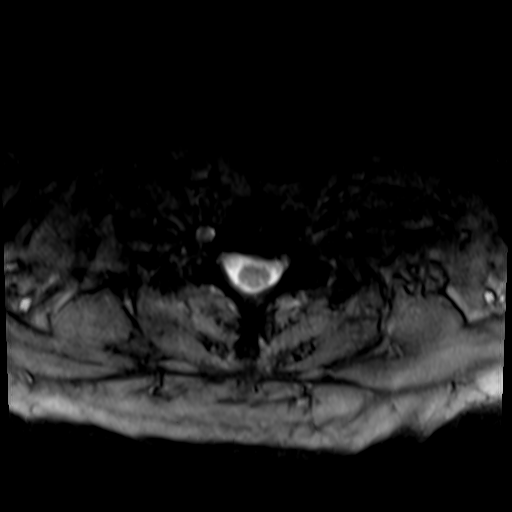
[im 21/48]
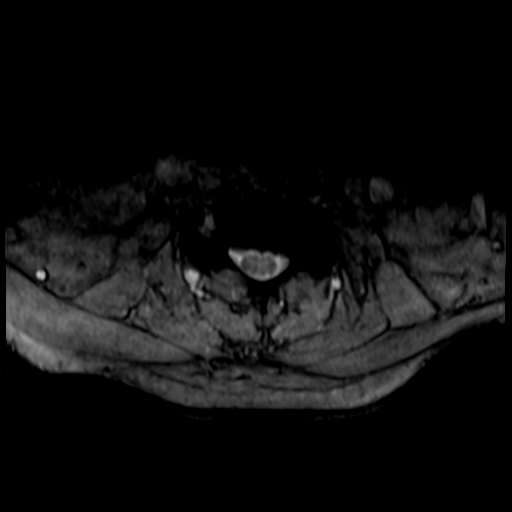
[im 27/48]
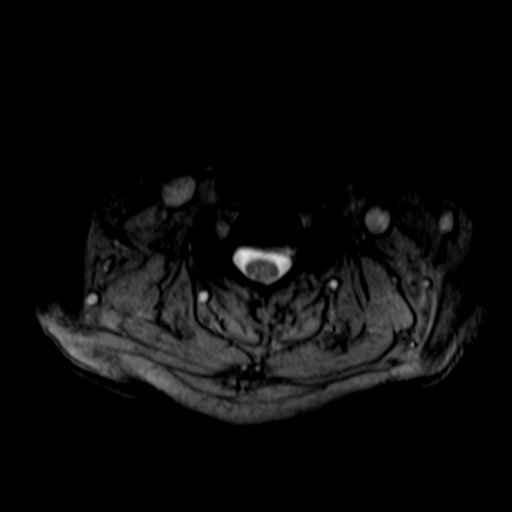
[im 34/48]
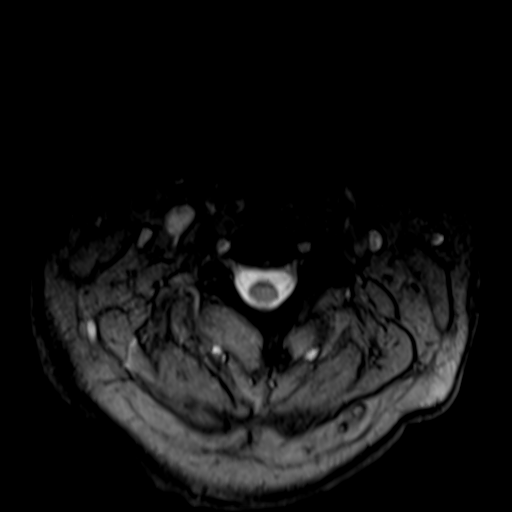
[im 41/48]
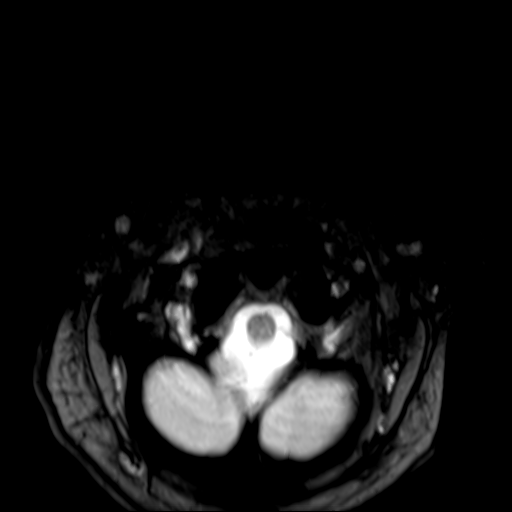
[im 48/48]
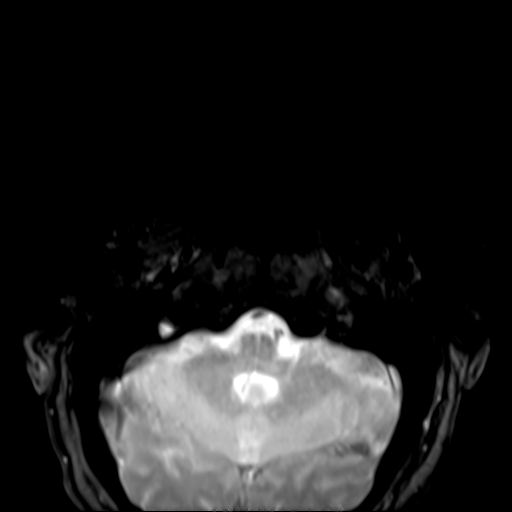

[Series 9: T1 · axial · 3.0mm · 0.78mm/px · z∈[-111,+42]mm · 8 of 48 slices shown (2 of 2)]
[im 1/48]
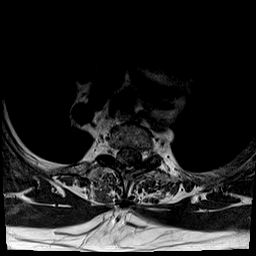
[im 7/48]
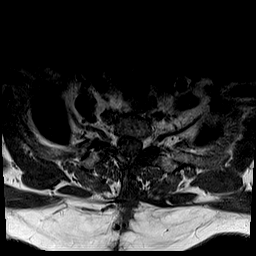
[im 14/48]
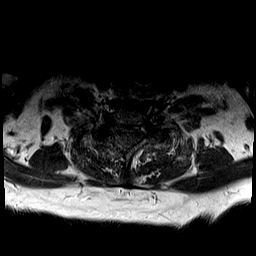
[im 21/48]
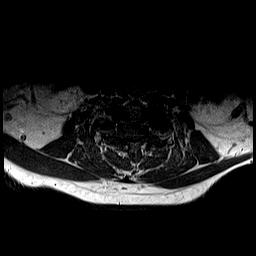
[im 27/48]
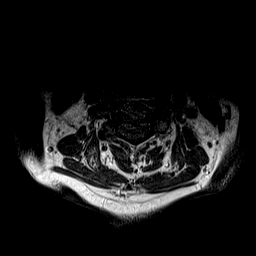
[im 34/48]
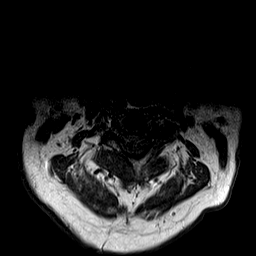
[im 41/48]
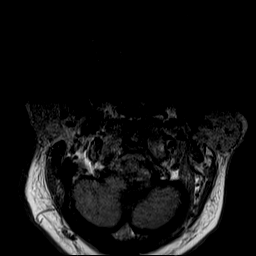
[im 48/48]
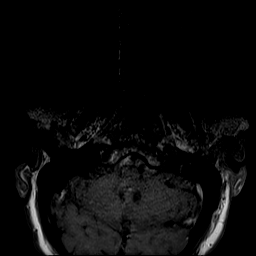

[Series 10: T2 · sagittal · 3.0mm · 0.69mm/px · 3 of 16 slices shown (3 of 3)]
[im 1/16]
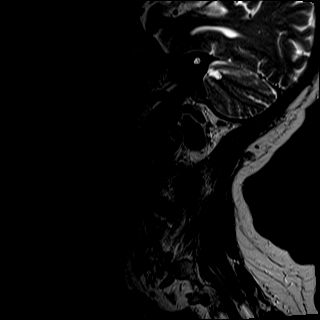
[im 8/16]
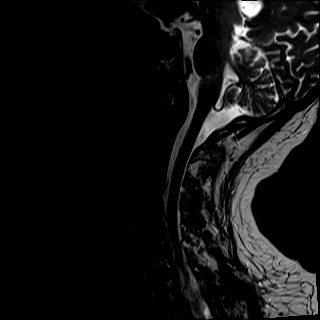
[im 16/16]
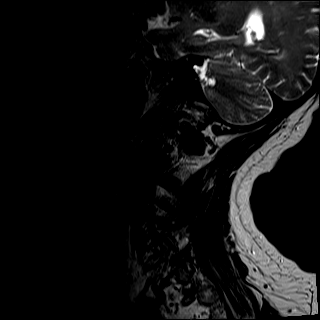

[Series 11: T1 fat-sat · sagittal · 3.0mm · 0.34mm/px · 1 of 16 slices shown]
[im 1/16]
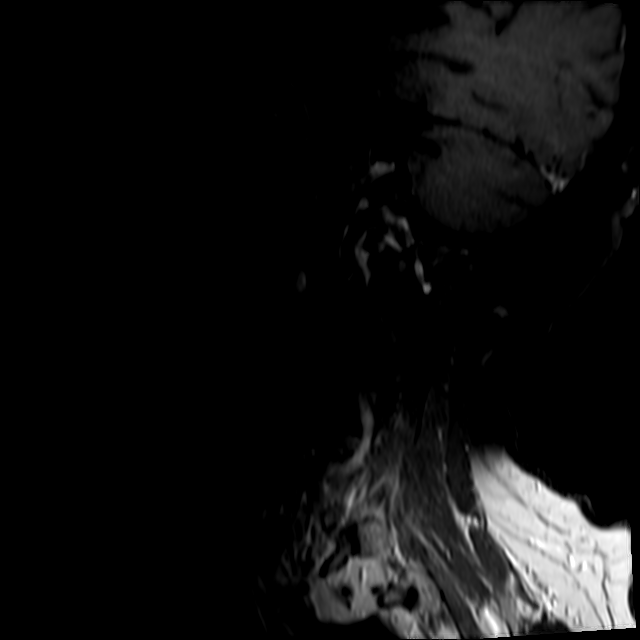

[32 of 48 positions shown; findings below may reference images not displayed]

FINDINGS: Alignment: Stable cervical lordosis. Subtle anterolisthesis of C7 on
T1.

Vertebrae: No convincing marrow edema or evidence of acute osseous
abnormality. Normal background bone marrow signal.

Cord: No cervical spinal cord signal abnormality. No abnormal
intradural enhancement. No definite dural thickening.

Posterior Fossa, vertebral arteries, paraspinal tissues: Area of
possible subfrontal meningioma described on [DATE] not included
today. Cervicomedullary junction is within normal limits. Negative
visible posterior fossa.

Preserved major vascular flow voids in the neck. Partially
retropharyngeal course of right ICA. Negative lung apices.

Resolved small postoperative fluid collection adjacent to the T1
spinous process. Regional granulation tissue with continued STIR
hyperenhancement. No new neck soft tissue finding.

Disc levels:

Unchanged C2-C3 through C6-C7 as described on [DATE] (please see
that report).

C7-T1: Subtle anterolisthesis with mild disc bulging and endplate
spurring. Moderate facet hypertrophy now appears greater on the
left. The left lateral recess stenosis appears resolved since [REDACTED].
No spinal stenosis. Some postoperative architectural distortion and
granulation tissue at the left neural foramen, but left C8 foraminal
patency appears improved since [REDACTED]. No right foraminal
stenosis.

T1-T2: Stable moderate facet hypertrophy. No stenosis.
IMPRESSION: 1. Expected evolution of postoperative changes on the Left at C7-T1.
Underlying subtle anterolisthesis now. Residual facet degeneration.
Resolved left lateral recess stenosis since [REDACTED]. And left C8
neural foraminal patency appears improved since [DATE]. Stable fairly advanced cervical disc, endplate, and facet
degeneration elsewhere as detailed on the MRI [DATE]. Up to mild
spinal stenosis at C5-C6 and C6-C7 and up to severe neural foraminal
stenosis at the right C4, left C5, and left C7 nerve levels.

## 2021-06-01 MED ORDER — GADOBUTROL 1 MMOL/ML IV SOLN
10.0000 mL | Freq: Once | INTRAVENOUS | Status: AC | PRN
  Administered 2021-06-01: 10 mL via INTRAVENOUS

## 2021-08-15 ENCOUNTER — Other Ambulatory Visit: Payer: Self-pay | Admitting: Cardiology

## 2021-08-15 NOTE — Telephone Encounter (Signed)
Pt of Dr. Stanford Breed. Please review for refill. Thank you!!

## 2021-08-21 NOTE — Progress Notes (Signed)
JOA:CZYSAY-TK CAD. Admitted September 2019 with acute infarct. Cardiac catheterization revealed an occluded RCA with left to right collaterals; 100% ostial ramus; normal LV function; PCI of ramus with drug-eluting stent. Abdominal ultrasound January 2020 showed no aneurysm.  Echocardiogram December 2021 showed normal LV function, moderate left ventricular hypertrophy, mild left atrial enlargement.  Nuclear study December 2021 showed ejection fraction 49%, prior apical infarct and no ischemia. Since last seen the patient denies any dyspnea on exertion, orthopnea, PND, pedal edema, palpitations, syncope or chest pain.   Current Outpatient Medications  Medication Sig Dispense Refill   acetaminophen (TYLENOL) 500 MG tablet Take 1,000 mg by mouth every 6 (six) hours as needed for mild pain or moderate pain.     atorvastatin (LIPITOR) 80 MG tablet Take 1 tablet (80 mg total) by mouth daily. Schedule office visit for future refills. 30 tablet 0   Cholecalciferol (VITAMIN D3) 5000 units TABS Take 5,000 Units by mouth 3 (three) times a week.      COVID-19 mRNA bivalent vaccine, Pfizer, (PFIZER COVID-19 VAC BIVALENT) injection Inject into the muscle. 0.3 mL 0   ezetimibe (ZETIA) 10 MG tablet TAKE 1 TABLET BY MOUTH  DAILY 90 tablet 3   Glucosamine HCl (GLUCOSAMINE PO) Take 500 mg by mouth daily.     influenza vaccine adjuvanted (FLUAD) 0.5 ML injection Inject into the muscle. 0.5 mL 0   lisinopril (ZESTRIL) 10 MG tablet TAKE 1 TABLET BY MOUTH  DAILY 90 tablet 3   meloxicam (MOBIC) 15 MG tablet Take 15 mg by mouth daily.     nitroGLYCERIN (NITROSTAT) 0.4 MG SL tablet Place 1 tablet (0.4 mg total) under the tongue every 5 (five) minutes x 3 doses as needed for chest pain. 25 tablet 1   fluticasone (FLONASE) 50 MCG/ACT nasal spray Place 2 sprays into both nostrils daily. 48 g 0   No current facility-administered medications for this visit.     Past Medical History:  Diagnosis Date   Colon polyp     Diverticular disease    Erectile dysfunction    GERD (gastroesophageal reflux disease)    NO Recent problems   Hyperlipidemia    Osteoarthritis    Seborrheic keratosis    STEMI (ST elevation myocardial infarction) ([place])    10/19/17 PCI/DES to ramus with CTO of the mRCA with collaterals, normal EF    Past Surgical History:  Procedure Laterality Date   athroscopic knee surgery     BIL KNEES   CORONARY/GRAFT ACUTE MI REVASCULARIZATION N/A 10/19/2017   Procedure: Coronary/Graft Acute MI Revascularization;  Surgeon: Jettie Booze, MD;  Location: [facility];  Service: Cardiovascular;  Laterality: N/A;   LEFT HEART CATH AND CORONARY ANGIOGRAPHY N/A 10/19/2017   Procedure: LEFT HEART CATH AND CORONARY ANGIOGRAPHY;  Surgeon: Jettie Booze, MD;  Location: [facility];  Service: Cardiovascular;  Laterality: N/A;   LEG SURGERY  1966   PINNING DUE TO INJURY   NOSE SURGERY     TOTAL KNEE ARTHROPLASTY Right 01/18/2014   Procedure: RIGHT TOTAL KNEE ARTHROPLASTY;  Surgeon: Tobi Bastos, MD;  Location: [facility];  Service: Orthopedics;  Laterality: Right;    Social History   Socioeconomic History   Marital status: Married    Spouse name: [name]   Number of children: 2   Years of education: 12   Highest education level: Not on file  Occupational History   Occupation: RETIRED     Employer: [employer]  Tobacco Use   Smoking status: Former    Packs/day: 2.00    Years: 45.00    Total pack years: 90.00    Types: Cigarettes    Quit date: 01/11/2012    Years since quitting: 9.6   Smokeless tobacco: Never   Tobacco comments:    He has quit smoking.    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.     Education:  [employer]    Tobacco Use/Exposure: He has quit smoking.        Alcohol Use: Occasional    Drug Use:  None   Diet:  Regular   Exercise:  He was walking daily until he hurt his hip.     Hobbies:  Fishing, Environmental education officer, Golf    Social Determinants of Radio broadcast assistant Strain: Not on [employer] Insecurity: Not on file  Transportation Needs: Not on file  Physical Activity: Not on file  Stress: Not on file  Social Connections: Not on file  Intimate Partner Violence: Not on file    Family History  Problem Relation Age of Onset   Coronary artery disease Brother        MI   Heart attack Brother    Stroke Mother    Cancer Father        Lung Cancer   Cancer Paternal Grandfather        Lung Cancer   Heart attack Paternal Grandfather     ROS: arthralgias but no fevers or chills, productive cough, hemoptysis, dysphasia, odynophagia, melena, hematochezia, dysuria, hematuria, rash, seizure activity, orthopnea, PND, pedal edema, claudication. Remaining systems are negative.  Physical Exam: Well-developed well-nourished in no acute distress.  Skin is warm and dry.  HEENT is normal.  Neck is supple.  Chest is clear to auscultation with normal expansion.  Cardiovascular exam is regular rate and rhythm.  Abdominal exam nontender or distended. No masses palpated. Extremities show no edema. neuro grossly intact   A/P  1 coronary artery disease-patient denies chest pain.  Continue medical therapy with statin and resume ASA.  2 hypertension-patient's blood pressure is controlled.  Continue present medications.  3 hyperlipidemia-continue statin.  4 history of bradycardia-plan to continue to avoid AV nodal blocking agents.  No history of syncope.  Kirk Ruths, MD

## 2021-09-04 ENCOUNTER — Encounter: Payer: Self-pay | Admitting: Cardiology

## 2021-09-04 ENCOUNTER — Ambulatory Visit: Payer: Medicare Other | Admitting: Cardiology

## 2021-09-04 VITALS — BP 128/62 | HR 50 | Ht 70.0 in | Wt 239.8 lb

## 2021-09-04 DIAGNOSIS — I10 Essential (primary) hypertension: Secondary | ICD-10-CM

## 2021-09-04 DIAGNOSIS — Z9861 Coronary angioplasty status: Secondary | ICD-10-CM

## 2021-09-04 DIAGNOSIS — E785 Hyperlipidemia, unspecified: Secondary | ICD-10-CM

## 2021-09-04 DIAGNOSIS — I2510 Atherosclerotic heart disease of native coronary artery without angina pectoris: Secondary | ICD-10-CM | POA: Diagnosis not present

## 2021-09-04 MED ORDER — ASPIRIN 81 MG PO TBEC: 81.0000 mg | DELAYED_RELEASE_TABLET | Freq: Every day | ORAL | 3 refills | Status: AC

## 2021-09-04 NOTE — Patient Instructions (Signed)
Medication Instructions:   START Asprin one (1) tablet by mouth ( 81 mg) daily.   *If you need a refill on your cardiac medications before your next appointment, please call your pharmacy*   Lab Work:  None ordered.  If you have labs (blood work) drawn today and your tests are completely normal, you will receive your results only by: [place] (if you have MyChart) OR A paper copy in the mail If you have any lab test that is abnormal or we need to change your treatment, we will call you to review the results.   Testing/Procedures:  None ordered.   Follow-Up: At [facility], you and your health needs are our priority.  As part of our continuing mission to provide you with exceptional heart care, we have created designated Provider Care Teams.  These Care Teams include your primary Cardiologist (physician) and Advanced Practice Providers (APPs -  Physician Assistants and Nurse Practitioners) who all work together to provide you with the care you need, when you need it.  We recommend signing up for the patient portal called "MyChart".  Sign up information is provided on this After Visit Summary.  MyChart is used to connect with patients for Virtual Visits (Telemedicine).  Patients are able to view lab/test results, encounter notes, upcoming appointments, etc.  Non-urgent messages can be sent to your provider as well.   To learn more about what you can do with MyChart, go to [email or web address].    Your next appointment:   1 year(s)  The format for your next appointment:   In Person  Provider:   Kirk Ruths, MD    Other Instructions  Your physician wants you to follow-up in: 1 year with Dr.Crenshaw.  You will receive a reminder letter in the mail two months in advance. If you don't receive a letter, please call our office to schedule the follow-up appointment.   Important Information About Sugar

## 2021-09-06 ENCOUNTER — Other Ambulatory Visit: Payer: Self-pay | Admitting: Cardiology

## 2021-09-24 ENCOUNTER — Other Ambulatory Visit (HOSPITAL_COMMUNITY): Payer: Self-pay

## 2021-09-27 ENCOUNTER — Other Ambulatory Visit: Payer: Self-pay | Admitting: Cardiology

## 2021-11-22 ENCOUNTER — Other Ambulatory Visit: Payer: Self-pay | Admitting: Cardiology

## 2021-11-22 DIAGNOSIS — E785 Hyperlipidemia, unspecified: Secondary | ICD-10-CM

## 2022-01-15 ENCOUNTER — Ambulatory Visit (HOSPITAL_BASED_OUTPATIENT_CLINIC_OR_DEPARTMENT_OTHER)
Admission: RE | Admit: 2022-01-15 | Discharge: 2022-01-15 | Disposition: A | Discharge status: Home / Self Care | Payer: Medicare Other | Source: Ambulatory Visit | Attending: Family Medicine | Admitting: Family Medicine

## 2022-01-15 DIAGNOSIS — Z87891 Personal history of nicotine dependence: Secondary | ICD-10-CM | POA: Insufficient documentation

## 2022-01-21 ENCOUNTER — Other Ambulatory Visit: Payer: Self-pay

## 2022-01-21 DIAGNOSIS — Z122 Encounter for screening for malignant neoplasm of respiratory organs: Secondary | ICD-10-CM

## 2022-01-21 DIAGNOSIS — Z87891 Personal history of nicotine dependence: Secondary | ICD-10-CM

## 2022-01-27 ENCOUNTER — Telehealth: Payer: Self-pay | Admitting: Cardiology

## 2022-01-27 NOTE — Telephone Encounter (Signed)
Returned call to patient who reports that he has been having issues with swelling and shortness of breath. Patient reports that his legs have been swollen for the last 10 or so days and states that his right is more swollen than the left. Patient denies any pain/warmness/redness to right leg. Patient states that the swelling goes down over night and returns during the day. Patient states the elevation does help. Patient reports no help with compression stockings. Patient reports that he has had some high sodium foods including sandwhich mean and country ham. Patient denies any chest pain, dizziness or any other symptoms besides the shortness of breath and swelling. Patient states that he also had a lung cancer screening CT and would like Dr. Stanford Breed to review as it resulted coronary calcifications, advised patient he does have know CAD. Offered patient an appointment with APP tomorrow but patient has other appointments. Scheduled patient an appointment with Diona Browner, PA for this Friday 01/31/22 at 245. Patient aware of appointment time, date and location. Advised patient to limit sodium intake, elevate legs when possible, apply compression stockings in the morning. Patient aware of all instructions.   Made patient aware of ED precautions should new or worsening symptoms develop. Patient verbalized understanding.   Will forward to MD to make aware.

## 2022-01-27 NOTE — Telephone Encounter (Signed)
Pt c/o Shortness Of Breath: STAT if SOB developed within the last 24 hours or pt is noticeably SOB on the phone  1. Are you currently SOB (can you hear that pt is SOB on the phone)?  No  2. How long have you been experiencing SOB?  About a week to 10 days  3. Are you SOB when sitting or when up moving around?   When moving around  4. Are you currently experiencing any other symptoms?  Swelling in both legs - right more than the left   Patient stated he had a lung cancer screening last week which shows he has atherosclerosis in chest and coronary arteries.  Patient stated he gets SOB when in the shower.

## 2022-01-31 ENCOUNTER — Ambulatory Visit: Payer: Medicare Other | Attending: Nurse Practitioner | Admitting: Nurse Practitioner

## 2022-01-31 ENCOUNTER — Encounter: Payer: Self-pay | Admitting: Nurse Practitioner

## 2022-01-31 VITALS — BP 130/80 | HR 43 | Ht 70.0 in | Wt 237.0 lb

## 2022-01-31 DIAGNOSIS — R0609 Other forms of dyspnea: Secondary | ICD-10-CM

## 2022-01-31 DIAGNOSIS — R001 Bradycardia, unspecified: Secondary | ICD-10-CM | POA: Diagnosis not present

## 2022-01-31 DIAGNOSIS — I2510 Atherosclerotic heart disease of native coronary artery without angina pectoris: Secondary | ICD-10-CM

## 2022-01-31 DIAGNOSIS — E785 Hyperlipidemia, unspecified: Secondary | ICD-10-CM

## 2022-01-31 DIAGNOSIS — R600 Localized edema: Secondary | ICD-10-CM

## 2022-01-31 DIAGNOSIS — I10 Essential (primary) hypertension: Secondary | ICD-10-CM

## 2022-01-31 NOTE — Patient Instructions (Signed)
Medication Instructions:  Your physician recommends that you continue on your current medications as directed. Please refer to the Current Medication list given to you today.   *If you need a refill on your cardiac medications before your next appointment, please call your pharmacy*   Lab Work: Your physician recommends that you complete lab work today BNP, BMET   If you have labs (blood work) drawn today and your tests are completely normal, you will receive your results only by: MyChart Message (if you have MyChart) OR A paper copy in the mail If you have any lab test that is abnormal or we need to change your treatment, we will call you to review the results.   Testing/Procedures: Your physician has requested that you have an echocardiogram. Echocardiography is a painless test that uses sound waves to create images of your heart. It provides your doctor with information about the size and shape of your heart and how well your heart's chambers and valves are working. This procedure takes approximately one hour. There are no restrictions for this procedure. Please do NOT wear cologne, perfume, aftershave, or lotions (deodorant is allowed). Please arrive 15 minutes prior to your appointment time.    Follow-Up: At [facility], you and your health needs are our priority.  As part of our continuing mission to provide you with exceptional heart care, we have created designated Provider Care Teams.  These Care Teams include your primary Cardiologist (physician) and Advanced Practice Providers (APPs -  Physician Assistants and Nurse Practitioners) who all work together to provide you with the care you need, when you need it.  We recommend signing up for the patient portal called "MyChart".  Sign up information is provided on this After Visit Summary.  MyChart is used to connect with patients for Virtual Visits (Telemedicine).  Patients are able to view lab/test results, encounter notes,  upcoming appointments, etc.  Non-urgent messages can be sent to your provider as well.   To learn more about what you can do with MyChart, go to [email or web address].    Your next appointment:   3-4 week(s)  Provider:   Diona Browner, NP        Other Instructions

## 2022-01-31 NOTE — Progress Notes (Signed)
Office Visit    Patient Name: Alec Johnson Date of Encounter: 01/31/2022  Primary Care Provider:  Katherina Mires, MD Primary Cardiologist:  Kirk Ruths, MD  Chief Complaint    72 year old male with a history of CAD s/p STEMI, CTO-mRCA with collaterals, DES-RI 2019, bradycardia, hypertension, hyperlipidemia, ED, and GERD who presents for follow-up related to CAD and shortness of breath.    Past Medical History    Past Medical History:  Diagnosis Date   Colon polyp    Diverticular disease    Erectile dysfunction    GERD (gastroesophageal reflux disease)    NO Recent problems   Hyperlipidemia    Osteoarthritis    Seborrheic keratosis    STEMI (ST elevation myocardial infarction) ([place])    10/28/17 PCI/DES to ramus with CTO of the mRCA with collaterals, normal EF   Past Surgical History:  Procedure Laterality Date   athroscopic knee surgery     BIL KNEES   CORONARY/GRAFT ACUTE MI REVASCULARIZATION N/A 10-28-17   Procedure: Coronary/Graft Acute MI Revascularization;  Surgeon: Jettie Booze, MD;  Location: [facility];  Service: Cardiovascular;  Laterality: N/A;   LEFT HEART CATH AND CORONARY ANGIOGRAPHY N/A October 28, 2017   Procedure: LEFT HEART CATH AND CORONARY ANGIOGRAPHY;  Surgeon: Jettie Booze, MD;  Location: [facility] [facility];  Service: Cardiovascular;  Laterality: N/A;   LEG SURGERY  1966   PINNING DUE TO INJURY   NOSE SURGERY     TOTAL KNEE ARTHROPLASTY Right 01/18/2014   Procedure: RIGHT TOTAL KNEE ARTHROPLASTY;  Surgeon: Tobi Bastos, MD;  Location: [facility];  Service: Orthopedics;  Laterality: Right;    Allergies  Allergies  Allergen Reactions   Doxycycline Hyclate Rash   Penicillins Itching and Rash   Rifampin Rash     Labs/Other Studies Reviewed    The following studies were reviewed today: LHC October 28, 2017:  Prox Cx to Mid Cx lesion is 25% stenosed. Mid RCA lesion is 100% stenosed. CTO with left to right collaterals. RCA  difficult to engage from right radial approach. The left ventricular systolic function is normal. LV end diastolic pressure is normal. The left ventricular ejection fraction is 55-65% by visual estimate. There is no aortic valve stenosis. Ost Ramus to Ramus lesion is 100% stenosed. Culprit for STEMI. A drug-eluting stent was successfully placed using a STENT SYNERGY DES 2.75X16. Post intervention, there is a 0% residual stenosis.   Recommend uninterrupted dual antiplatelet therapy with Aspirin '81mg'$  daily and Ticagrelor '90mg'$  twice daily for a minimum of 12 months (ACS - Class I recommendation).   Echo 12/28/2019: IMPRESSIONS    1. Left ventricular ejection fraction, by estimation, is 60 to 65%. The  left ventricle has normal function. The left ventricle has no regional  wall motion abnormalities. There is moderate left ventricular hypertrophy.  Left ventricular diastolic  parameters are indeterminate.   2. Right ventricular systolic function is normal. The right ventricular  size is normal. Tricuspid regurgitation signal is inadequate for assessing  PA pressure.   3. Left atrial size was mildly dilated.   4. The mitral valve is normal in structure. No evidence of mitral valve  regurgitation. No evidence of mitral stenosis.   5. The aortic valve is tricuspid. Aortic valve regurgitation is not  visualized. Mild aortic valve sclerosis is present, with no evidence of  aortic valve stenosis.   6. Aortic dilatation noted. There is mild dilatation of the ascending  aorta, measuring 36 mm.  Lexiscan myoview 01/04/2020: The left ventricular ejection fraction is mildly decreased (45-54%). Nuclear stress EF: 49%. There was no ST segment deviation noted during stress. Defect 1: There is a small defect of moderate severity present in the apical lateral and apex location. Findings consistent with prior myocardial infarction. There is no evidence of ischemia. Recent Labs: No results found for  requested labs within last 365 days.  Recent Lipid Panel    Component Value Date/Time   CHOL 84 12/28/2019 0300   CHOL 100 03/30/2019 0902   TRIG 43 12/28/2019 0300   HDL 33 (L) 12/28/2019 0300   HDL 40 03/30/2019 0902   CHOLHDL 2.5 12/28/2019 0300   VLDL 9 12/28/2019 0300   LDLCALC 42 12/28/2019 0300   LDLCALC 50 03/30/2019 0902    History of Present Illness    72 year old male with the above past medical history including CAD s/p STEMI, CTO-mRCA with collaterals, DES-RI 2019, bradycardia, hypertension, hyperlipidemia, ED, and GERD.  He was hospitalized in September 2019 in the setting of acute MI.  Catheterization revealed an occluded RCA with left-to-right collaterals, 1 high percent ostial ramus s/p DES, normal LV function.  Abdominal ultrasound in January 2020 showed no evidence of aneurysm.  Echocardiogram in December 2021 showed normal LV function, moderate LVH, mild left atrial enlargement.  Nuclear study in December 2021 showed EF 49%, prior apical infarct, no evidence of ischemia.  He was last seen in the office on 09/04/2021 and was stable from a cardiac standpoint.  He denies symptoms concerning for angina.  He contacted our office on 01/27/2022 and reported to them 1 week history of dyspnea on exertion, bilateral lower extremity edema, right greater than left.  He voiced concern that he had a CT lung cancer screening in December 2023 which revealed coronary artery calcification including calcified atherosclerotic plaque in the left anterior descending, left circumflex and right coronary arteries.  Triage nurse reassured patient that he does have history of known CAD but advised follow-up OV.  He presents today for follow-up.  Since his last visit he has been stable overall from a cardiac standpoint though he does note an almost 2-week history of anginal dyspnea on exertion, bilateral lower extremity edema, right greater than left.  He he notes increased dietary intake of sodium since  the holidays.  He denies chest pain, PND, orthopnea, weight gain.  He did note shortness of breath prior to his nuclear study in December 2021.  He was short of breath the other day while taking a shower.  However, today he removed all of his large area rugs, washed and vacuumed them, and had no symptoms whatsoever.  Other than his recent swelling and shortness of breath, he reports feeling well.  Home Medications    Current Outpatient Medications  Medication Sig Dispense Refill   acetaminophen (TYLENOL) 500 MG tablet Take 1,000 mg by mouth every 6 (six) hours as needed for mild pain or moderate pain.     aspirin EC 81 MG tablet Take 1 tablet (81 mg total) by mouth daily. Swallow whole. 90 tablet 3   atorvastatin (LIPITOR) 80 MG tablet TAKE 1 TABLET BY MOUTH DAILY 30 tablet 11   Cholecalciferol (VITAMIN D3) 5000 units TABS Take 5,000 Units by mouth 3 (three) times a week.      COVID-19 mRNA bivalent vaccine, Pfizer, (PFIZER COVID-19 VAC BIVALENT) injection Inject into the muscle. 0.3 mL 0   ezetimibe (ZETIA) 10 MG tablet TAKE 1 TABLET BY MOUTH DAILY 90 tablet 3  Glucosamine HCl (GLUCOSAMINE PO) Take 500 mg by mouth daily.     influenza vaccine adjuvanted (FLUAD) 0.5 ML injection Inject into the muscle. 0.5 mL 0   lisinopril (ZESTRIL) 10 MG tablet TAKE 1 TABLET BY MOUTH  DAILY 90 tablet 3   meloxicam (MOBIC) 15 MG tablet Take 15 mg by mouth daily.     nitroGLYCERIN (NITROSTAT) 0.4 MG SL tablet Place 1 tablet (0.4 mg total) under the tongue every 5 (five) minutes x 3 doses as needed for chest pain. 25 tablet 1   fluticasone (FLONASE) 50 MCG/ACT nasal spray Place 2 sprays into both nostrils daily. 48 g 0   No current facility-administered medications for this visit.     Review of Systems    He denies chest pain, palpitations, pnd, orthopnea, n, v, dizziness, syncope, weight gain, or early satiety. All other systems reviewed and are otherwise negative except as noted above.   Physical Exam     VS:  BP 130/80   Pulse (!) 43   Ht '5\' 10"'$  (1.778 m)   Wt 237 lb (107.5 kg)   SpO2 93%   BMI 34.01 kg/m  GEN: Well nourished, well developed, in no acute distress. HEENT: normal. Neck: Supple, no JVD, carotid bruits, or masses. Cardiac: RRR, no murmurs, rubs, or gallops. No clubbing, cyanosis, nonpitting bilateral calf edema.  Radials/DP/PT 2+ and equal bilaterally.  Respiratory:  Respirations regular and unlabored, clear to auscultation bilaterally. GI: Soft, nontender, nondistended, BS + x 4. MS: no deformity or atrophy. Skin: warm and dry, no rash. Neuro:  Strength and sensation are intact. Psych: Normal affect.  Accessory Clinical Findings    ECG personally reviewed by me today -this bradycardia, 43 bpm- no acute changes.   Lab Results  Component Value Date   WBC 7.2 12/21/2020   HGB 14.5 12/21/2020   HCT 43.0 12/21/2020   MCV 95.1 12/21/2020   PLT 159 12/21/2020   Lab Results  Component Value Date   CREATININE 0.87 12/21/2020   BUN 24 (H) 12/21/2020   NA 138 12/21/2020   K 4.4 12/21/2020   CL 104 12/21/2020   CO2 28 12/21/2020   Lab Results  Component Value Date   ALT 24 03/30/2019   AST 20 03/30/2019   ALKPHOS 91 03/30/2019   BILITOT 0.6 03/30/2019   Lab Results  Component Value Date   CHOL 84 12/28/2019   HDL 33 (L) 12/28/2019   LDLCALC 42 12/28/2019   TRIG 43 12/28/2019   CHOLHDL 2.5 12/28/2019    Lab Results  Component Value Date   HGBA1C 5.8 (H) 10/20/2017    Assessment & Plan    1. Dyspnea on exertion/bilateral lower extremity edema: He notes an almost 2-week history of intermittent dyspnea on exertion, bilateral lower extremity edema.  He denies PND, orthopnea, weight gain.  He has mild nonpitting lower extremity edema on exam, otherwise, euvolemic and well compensated.  He does note some recent dietary indiscretion.  Will check echo, BNP, BMET today.  If BNP elevated, consider addition of low-dose Lasix.  If symptoms persist, pending echo  results, consider ischemic evaluation as below.  2. CAD:  S/p STEMI, CTO-mRCA with collaterals, DES-RI 2019.  Does note recent increased dyspnea on exertion.  He denies any chest pain.  He states this is not similar to his symptoms prior to his heart attack in 2019, however, he did note shortness of breath prior to his stress test in 2021.  Fortunately, nuclear study was negative for ischemia.  Discussed possible repeat stress test, however, patient is hesitant to proceed as he states it made him feel "terrible."  I advised him that if his symptoms persist and echocardiogram is unremarkable, we would likely recommend further ischemic evaluation.  Continue aspirin, lisinopril, Lipitor, and Zetia.  3. Bradycardia: Stable on today's EKG.  He is asymptomatic.  4. Hypertension: BP well controlled. Continue current antihypertensive regimen.   5. Hyperlipidemia: LDL was 55 in 08/2021.  Monitored per PCP.  Continue aspirin, Lipitor, Zetia.  6. Disposition: Follow-up in 3 to 4 weeks.     Lenna Sciara, NP 01/31/2022, 5:42 PM

## 2022-02-03 LAB — BASIC METABOLIC PANEL
BUN/Creatinine Ratio: 25 — ABNORMAL HIGH (ref 10–24)
BUN: 21 mg/dL (ref 8–27)
CO2: 24 mmol/L (ref 20–29)
Calcium: 9.3 mg/dL (ref 8.6–10.2)
Chloride: 104 mmol/L (ref 96–106)
Creatinine, Ser: 0.85 mg/dL (ref 0.76–1.27)
Glucose: 78 mg/dL (ref 70–99)
Potassium: 4 mmol/L (ref 3.5–5.2)
Sodium: 143 mmol/L (ref 134–144)
eGFR: 92 mL/min/{1.73_m2} (ref >59)

## 2022-02-03 LAB — BRAIN NATRIURETIC PEPTIDE: BNP: 108.9 pg/mL — ABNORMAL HIGH (ref 0.0–100.0)

## 2022-02-05 ENCOUNTER — Telehealth: Payer: Self-pay

## 2022-02-05 DIAGNOSIS — I10 Essential (primary) hypertension: Secondary | ICD-10-CM

## 2022-02-05 DIAGNOSIS — Z79899 Other long term (current) drug therapy: Secondary | ICD-10-CM

## 2022-02-05 MED ORDER — FUROSEMIDE 20 MG PO TABS: 20.0000 mg | ORAL_TABLET | Freq: Every day | ORAL | 3 refills | Status: DC

## 2022-02-05 NOTE — Telephone Encounter (Signed)
Spoke with pt. Pt was notified of lab results. Pt will start Lasix 20 mg daily as directed and repeat labs in 2 weeks. Lab orders placed and medication sent to pts pharmacy.

## 2022-02-11 ENCOUNTER — Telehealth: Payer: Self-pay | Admitting: Cardiology

## 2022-02-11 MED ORDER — FUROSEMIDE 20 MG PO TABS: ORAL_TABLET | ORAL | 3 refills | Status: DC

## 2022-02-11 NOTE — Telephone Encounter (Signed)
Patient stated he started taking lasix '20mg'$  on 1/18, and since then, he has experience constipation and dizziness. He took dulcolax and had results. Orthostatic precautions discussed. He stated he stopped taking lasix today. Medication explained with regard to BNP. While on phone, BP 129/75, P 55. Patient wants to know if he should stay on lasix with a laxative or take another diuretic. Please advise.

## 2022-02-11 NOTE — Telephone Encounter (Signed)
LMTCB.

## 2022-02-11 NOTE — Telephone Encounter (Signed)
Pt c/o medication issue:  1. Name of Medication: furosemide (LASIX) 20 MG tablet   2. How are you currently taking this medication (dosage and times per day)? Took his last pill yesterday   3. Are you having a reaction (difficulty breathing--STAT)? Yes  4. What is your medication issue?  Pt states he was prescribed this medication last appt and says that it seems to make him have stomach cramps and constipation. Requesting call back to see what his options are. Please advise.

## 2022-02-11 NOTE — Telephone Encounter (Signed)
Patient advised he can take lasix '20mg'$  daily as needed for swelling or for weight gain of 3 pounds over night or 5 pounds in a week. He repeated this back in his own words. Med list updated. Patient stated he won't come for BMET in 2 weeks.

## 2022-02-11 NOTE — Addendum Note (Signed)
Addended by: Betha Loa F on: 02/11/2022 01:20 PM   Modules accepted: Orders

## 2022-02-11 NOTE — Addendum Note (Signed)
Addended by: Betha Loa F on: 02/11/2022 01:21 PM   Modules accepted: Orders

## 2022-02-12 ENCOUNTER — Ambulatory Visit (HOSPITAL_COMMUNITY): Payer: Medicare Other | Attending: Nurse Practitioner

## 2022-02-12 DIAGNOSIS — R600 Localized edema: Secondary | ICD-10-CM

## 2022-02-12 DIAGNOSIS — R0609 Other forms of dyspnea: Secondary | ICD-10-CM | POA: Insufficient documentation

## 2022-02-12 DIAGNOSIS — I2510 Atherosclerotic heart disease of native coronary artery without angina pectoris: Secondary | ICD-10-CM | POA: Insufficient documentation

## 2022-02-12 LAB — ECHOCARDIOGRAM COMPLETE
Area-P 1/2: 3.12 cm2
S' Lateral: 3.6 cm

## 2022-02-13 ENCOUNTER — Ambulatory Visit: Payer: Medicare Other | Admitting: Nurse Practitioner

## 2022-02-19 ENCOUNTER — Telehealth: Payer: Self-pay | Admitting: Nurse Practitioner

## 2022-02-19 ENCOUNTER — Other Ambulatory Visit: Payer: Self-pay | Admitting: Nurse Practitioner

## 2022-02-19 MED ORDER — DIAZEPAM 10 MG PO TABS: 10.0000 mg | ORAL_TABLET | Freq: Once | ORAL | 0 refills | Status: AC

## 2022-02-19 NOTE — Telephone Encounter (Signed)
Patient states he just missed a call from our office.  He said an MRI was suppose to be order for him.  I did not see an order for one.

## 2022-02-19 NOTE — Telephone Encounter (Signed)
   Patient Name: Alec Johnson  DOB: August 01, 1950 MRN: [id number]  Primary Cardiologist: Kirk Ruths, MD  Called patient to discuss recommendations for cardiac MRI and need for premedication due to claustrophobia.  No answer.  Left a voicemail for patient with the following details: Discussed premeds with Dr. Stanford Breed, primary cardiologist.  Patient has taken Valium 10 mg in the past prior to MRI.  Will send one-time prescription for 10 mg of Valium to be taken 60 minutes prior to cardiac MRI.  I will have our staff reach out to patient to schedule cardiac MRI.  Lenna Sciara, NP 02/19/2022, 5:23 PM

## 2022-02-20 ENCOUNTER — Other Ambulatory Visit: Payer: Self-pay

## 2022-02-20 ENCOUNTER — Telehealth: Payer: Self-pay

## 2022-02-20 DIAGNOSIS — I421 Obstructive hypertrophic cardiomyopathy: Secondary | ICD-10-CM

## 2022-02-20 DIAGNOSIS — Z79899 Other long term (current) drug therapy: Secondary | ICD-10-CM

## 2022-02-20 NOTE — Telephone Encounter (Signed)
Spoke with pt. Pt was notified of Cardiac MRI needed per Diona Browner NP. Order has been placed along with CBC. Pt is aware tha lab work is needed prior to Cardiac MRI. Please arrange cardiac MRI.

## 2022-02-24 ENCOUNTER — Other Ambulatory Visit: Payer: Self-pay

## 2022-02-24 DIAGNOSIS — I421 Obstructive hypertrophic cardiomyopathy: Secondary | ICD-10-CM

## 2022-02-24 DIAGNOSIS — Z79899 Other long term (current) drug therapy: Secondary | ICD-10-CM

## 2022-02-24 LAB — CBC
Hematocrit: 41.7 % (ref 37.5–51.0)
Hemoglobin: 13.9 g/dL (ref 13.0–17.7)
MCH: 31.3 pg (ref 26.6–33.0)
MCHC: 33.3 g/dL (ref 31.5–35.7)
MCV: 94 fL (ref 79–97)
Platelets: 176 10*3/uL (ref 150–450)
RBC: 4.44 x10E6/uL (ref 4.14–5.80)
RDW: 11.8 % (ref 11.6–15.4)
WBC: 10 10*3/uL (ref 3.4–10.8)

## 2022-02-24 NOTE — Telephone Encounter (Signed)
Noted. See documentation. Spoke with pt concerning cardiac MRI needed.

## 2022-02-25 LAB — BASIC METABOLIC PANEL
BUN/Creatinine Ratio: 22 (ref 10–24)
BUN: 20 mg/dL (ref 8–27)
CO2: 24 mmol/L (ref 20–29)
Calcium: 9.4 mg/dL (ref 8.6–10.2)
Chloride: 105 mmol/L (ref 96–106)
Creatinine, Ser: 0.91 mg/dL (ref 0.76–1.27)
Glucose: 90 mg/dL (ref 70–99)
Potassium: 4.1 mmol/L (ref 3.5–5.2)
Sodium: 144 mmol/L (ref 134–144)
eGFR: 89 mL/min/{1.73_m2} (ref >59)

## 2022-02-28 ENCOUNTER — Ambulatory Visit: Payer: Medicare Other | Admitting: Nurse Practitioner

## 2022-03-03 ENCOUNTER — Other Ambulatory Visit: Payer: Self-pay | Admitting: *Deleted

## 2022-03-12 ENCOUNTER — Telehealth (HOSPITAL_COMMUNITY): Payer: Self-pay | Admitting: Emergency Medicine

## 2022-03-12 NOTE — Telephone Encounter (Signed)
Reaching out to patient to offer assistance regarding upcoming cardiac imaging study; pt verbalizes understanding of appt date/time, parking situation and where to check in, pre-test NPO status and medications ordered, and verified current allergies; name and call back number provided for further questions should they arise Alec Bond RN Navigator Cardiac Imaging [facility] [phone] office [phone] cell   Arrival [address] Orth[facility]  Hold lasix Denies iv issues [place] - taking valium 1 hr prior

## 2022-03-13 ENCOUNTER — Other Ambulatory Visit: Payer: Self-pay | Admitting: Nurse Practitioner

## 2022-03-13 ENCOUNTER — Ambulatory Visit (HOSPITAL_COMMUNITY)
Admission: RE | Admit: 2022-03-13 | Discharge: 2022-03-13 | Disposition: A | Discharge status: Home / Self Care | Payer: Medicare Other | Source: Ambulatory Visit | Attending: Nurse Practitioner | Admitting: Nurse Practitioner

## 2022-03-13 DIAGNOSIS — I421 Obstructive hypertrophic cardiomyopathy: Secondary | ICD-10-CM

## 2022-03-13 MED ORDER — GADOBUTROL 1 MMOL/ML IV SOLN
13.0000 mL | Freq: Once | INTRAVENOUS | Status: AC | PRN
  Administered 2022-03-13: 13 mL via INTRAVENOUS

## 2022-03-19 ENCOUNTER — Telehealth: Payer: Self-pay | Admitting: Cardiology

## 2022-03-19 NOTE — Telephone Encounter (Signed)
Returned call to patient who states he was calling to get Cardiac MRI results. Patient states that he is going out of town today at 1 and would like to know results prior to ensure that this is still okay and nothing further is needed. Patient reports that he is still feeling the same as he has been - no worsening symptoms. Advised patient I would forward message over to [facility] and Dr. Stanford Breed. Patient verbalized understanding.

## 2022-03-19 NOTE — Telephone Encounter (Signed)
Spoke with pt, aware of MRI results.

## 2022-03-19 NOTE — Telephone Encounter (Signed)
Patient called to get test results.

## 2022-03-20 NOTE — Telephone Encounter (Signed)
Noted  

## 2022-05-21 ENCOUNTER — Other Ambulatory Visit: Payer: Self-pay | Admitting: Cardiology

## 2022-05-26 ENCOUNTER — Other Ambulatory Visit (HOSPITAL_COMMUNITY): Payer: Medicare Other

## 2022-06-03 ENCOUNTER — Other Ambulatory Visit: Payer: Self-pay | Admitting: Cardiology

## 2022-08-14 NOTE — Progress Notes (Signed)
HPI: Follow-up CAD. Admitted September 2019 with acute infarct. Cardiac catheterization revealed an occluded RCA with left to right collaterals; 100% ostial ramus; normal LV function; PCI of ramus with drug-eluting stent. Abdominal ultrasound January 2020 showed no aneurysm. Nuclear study December 2021 showed ejection fraction 49%, prior apical infarct and no ischemia.  Echocardiogram January 2024 showed normal LV function, severe left ventricular hypertrophy, moderate left atrial enlargement.  Cardiac MRI February 2024 showed normal LV size, moderate left ventricular hypertrophy, ejection fraction 50%, possible prior myocarditis versus sarcoid and PET scan could be considered to further assess, dilated ascending aorta at 40 mm.  Since last seen the patient denies any dyspnea on exertion, orthopnea, PND, pedal edema, palpitations, syncope or chest pain.   Current Outpatient Medications  Medication Sig Dispense Refill   acetaminophen (TYLENOL) 500 MG tablet Take 1,000 mg by mouth every 6 (six) hours as needed for mild pain or moderate pain.     aspirin EC 81 MG tablet Take 1 tablet (81 mg total) by mouth daily. Swallow whole. 90 tablet 3   atorvastatin (LIPITOR) 80 MG tablet TAKE 1 TABLET BY MOUTH ONCE  DAILY 100 tablet 2   Cholecalciferol (VITAMIN D3) 5000 units TABS Take 5,000 Units by mouth 3 (three) times a week.      COVID-19 mRNA bivalent vaccine, Pfizer, (PFIZER COVID-19 VAC BIVALENT) injection Inject into the muscle. 0.3 mL 0   ezetimibe (ZETIA) 10 MG tablet TAKE 1 TABLET BY MOUTH DAILY 90 tablet 3   Glucosamine HCl (GLUCOSAMINE PO) Take 500 mg by mouth daily.     influenza vaccine adjuvanted (FLUAD) 0.5 ML injection Inject into the muscle. 0.5 mL 0   lisinopril (ZESTRIL) 10 MG tablet TAKE 1 TABLET BY MOUTH DAILY 100 tablet 2   meloxicam (MOBIC) 15 MG tablet Take 15 mg by mouth daily.     nitroGLYCERIN (NITROSTAT) 0.4 MG SL tablet Place 1 tablet (0.4 mg total) under the tongue every 5  (five) minutes x 3 doses as needed for chest pain. 25 tablet 1   fluticasone (FLONASE) 50 MCG/ACT nasal spray Place 2 sprays into both nostrils daily. 48 g 0   furosemide (LASIX) 20 MG tablet Take 1 tablet (20mg ) daily by mouth as needed for swelling or with weight gain of 3 pounds overnight or 5 pounds in a week. (Patient not taking: Reported on 08/27/2022) 30 tablet 3   No current facility-administered medications for this visit.     Past Medical History:  Diagnosis Date   Colon polyp    Diverticular disease    Erectile dysfunction    GERD (gastroesophageal reflux disease)    NO Recent problems   Hyperlipidemia    Osteoarthritis    Seborrheic keratosis    STEMI (ST elevation myocardial infarction) (HCC)    10/19/17 PCI/DES to ramus with CTO of the mRCA with collaterals, normal EF    Past Surgical History:  Procedure Laterality Date   athroscopic knee surgery     BIL KNEES   CORONARY/GRAFT ACUTE MI REVASCULARIZATION N/A 10/19/2017   Procedure: Coronary/Graft Acute MI Revascularization;  Surgeon: Corky Crafts, MD;  Location: [facility] INVASIVE CV LAB;  Service: Cardiovascular;  Laterality: N/A;   LEFT HEART CATH AND CORONARY ANGIOGRAPHY N/A 10/19/2017   Procedure: LEFT HEART CATH AND CORONARY ANGIOGRAPHY;  Surgeon: Corky Crafts, MD;  Location: [facility] INVASIVE CV LAB;  Service: Cardiovascular;  Laterality: N/A;   LEG SURGERY  1966   PINNING DUE TO INJURY  NOSE SURGERY     TOTAL KNEE ARTHROPLASTY Right 01/18/2014   Procedure: RIGHT TOTAL KNEE ARTHROPLASTY;  Surgeon: Jacki Cones, MD;  Location: [facility] ORS;  Service: Orthopedics;  Laterality: Right;    Social History   Socioeconomic History   Marital status: Married    Spouse name: Bonita Quin   Number of children: 2   Years of education: 12   Highest education level: Not on file  Occupational History   Occupation: RETIRED     Employer: [employer]  Tobacco Use   Smoking status: Former    Current packs/day: 0.00     Average packs/day: 2.0 packs/day for 45.0 years (90.0 ttl pk-yrs)    Types: Cigarettes    Start date: 01/11/1967    Quit date: 01/11/2012    Years since quitting: 10.6   Smokeless tobacco: Never   Tobacco comments:    He has quit smoking.    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     Education:  [employer]    Tobacco Use/Exposure: He has quit smoking.        Alcohol Use: Occasional    Drug Use:  None   Diet:  Regular   Exercise:  He was walking daily until he hurt his hip.     Hobbies:  Fishing, Social research officer, government, Golf    Social Determinants of Health   Financial Resource Strain: Low Risk  (09/14/2021)   Received from [facility], [facility]   Overall Financial Resource Strain (CARDIA)    Difficulty of Paying Living Expenses: Not hard at all  Food Insecurity: No Food Insecurity (09/14/2021)   Received from [facility], [facility]   Hunger Vital Sign    Worried About Running Out of Food in the Last Year: Never true    Ran Out of Food in the Last Year: Never true  Transportation Needs: No Transportation Needs (09/14/2020)   Received from [facility]   PRAPARE - Transportation    Lack of Transportation (Medical): No    Lack of Transportation (Non-Medical): No  Physical Activity: Sufficiently Active (09/14/2021)   Received from [facility], [facility]   Exercise Vital Sign    Days of Exercise per Week: 2 days    Minutes of Exercise per Session: 90 min  Stress: No Stress Concern Present (09/14/2021)   Received from [facility], [facility] - Occupational Stress  Questionnaire    Feeling of Stress : Only a little  Social Connections: Socially Integrated (09/14/2021)   Received from [facility], [facility]   Social Network    How would you rate your social network (family, work, friends)?: Good participation with social networks  Intimate Partner Violence: Not At Risk (09/14/2021)   Received from [facility], [facility]   HITS    Over the last 12 months how often did your partner physically hurt you?: 1    Over the last 12 months how often did your partner insult you or talk down to you?: 1    Over the last 12 months how often did your  partner threaten you with physical harm?: 1    Over the last 12 months how often did your partner scream or curse at you?: 1    Family History  Problem Relation Age of Onset   Coronary artery disease Brother        MI   Heart attack Brother    Stroke Mother    Cancer Father        Lung Cancer   Cancer Paternal Grandfather        Lung Cancer   Heart attack Paternal Grandfather     ROS: no fevers or chills, productive cough, hemoptysis, dysphasia, odynophagia, melena, hematochezia, dysuria, hematuria, rash, seizure activity, orthopnea, PND, pedal edema, claudication. Remaining systems are negative.  Physical Exam: Well-developed well-nourished in no acute distress.  Skin is warm and dry.  HEENT is normal.  Neck is supple.  Chest is clear to auscultation with normal expansion.  Cardiovascular exam is regular rate and rhythm.  Abdominal exam nontender or distended. No masses palpated. Extremities show no edema. neuro grossly intact    A/P  1 coronary artery disease-patient doing well from a symptomatic standpoint with no chest pain.  Continue aspirin and statin.  2 hypertension-blood pressure controlled.  Continue present medical regimen.  3 hyperlipidemia-continue statin.  4 history of bradycardia-patient has had no symptoms including no syncope.  We will avoid AV nodal blocking  agents.  5 abnormal MRI-previous MRI with findings suggestive of prior myocarditis versus sarcoid.  However CT showed no adenopathy to suggest sarcoid.  No conduction abnormalities noted on ECG.  Will not pursue PET scan at this point.  Olga Millers, MD

## 2022-08-27 ENCOUNTER — Encounter: Payer: Self-pay | Admitting: Cardiology

## 2022-08-27 ENCOUNTER — Ambulatory Visit: Payer: Medicare Other | Attending: Cardiology | Admitting: Cardiology

## 2022-08-27 VITALS — BP 118/80 | HR 50 | Ht 70.0 in | Wt 233.0 lb

## 2022-08-27 DIAGNOSIS — E785 Hyperlipidemia, unspecified: Secondary | ICD-10-CM

## 2022-08-27 DIAGNOSIS — I2510 Atherosclerotic heart disease of native coronary artery without angina pectoris: Secondary | ICD-10-CM

## 2022-08-27 DIAGNOSIS — I10 Essential (primary) hypertension: Secondary | ICD-10-CM

## 2022-08-27 NOTE — Patient Instructions (Signed)

## 2022-09-19 LAB — LAB REPORT - SCANNED
A1c: 6
EGFR (Non-African Amer.): 84

## 2022-10-09 ENCOUNTER — Other Ambulatory Visit (HOSPITAL_BASED_OUTPATIENT_CLINIC_OR_DEPARTMENT_OTHER): Payer: Self-pay

## 2022-10-09 MED ORDER — FLUAD 0.5 ML IM SUSY
PREFILLED_SYRINGE | INTRAMUSCULAR | 0 refills | Status: DC
  Filled 2022-10-09: qty 0.5, 1d supply, fill #0

## 2022-10-09 MED ORDER — COMIRNATY 30 MCG/0.3ML IM SUSY
PREFILLED_SYRINGE | INTRAMUSCULAR | 0 refills | Status: DC
  Filled 2022-10-09: qty 0.3, 1d supply, fill #0

## 2022-10-24 ENCOUNTER — Other Ambulatory Visit: Payer: Self-pay | Admitting: Cardiology

## 2022-10-24 DIAGNOSIS — E785 Hyperlipidemia, unspecified: Secondary | ICD-10-CM

## 2022-11-12 ENCOUNTER — Ambulatory Visit: Payer: Medicare Other | Attending: Student | Admitting: Student

## 2022-11-12 VITALS — BP 148/82 | HR 48 | Ht 69.5 in | Wt 226.6 lb

## 2022-11-12 DIAGNOSIS — E785 Hyperlipidemia, unspecified: Secondary | ICD-10-CM

## 2022-11-12 DIAGNOSIS — I10 Essential (primary) hypertension: Secondary | ICD-10-CM

## 2022-11-12 DIAGNOSIS — R001 Bradycardia, unspecified: Secondary | ICD-10-CM

## 2022-11-12 DIAGNOSIS — R0789 Other chest pain: Secondary | ICD-10-CM | POA: Diagnosis not present

## 2022-11-12 DIAGNOSIS — R079 Chest pain, unspecified: Secondary | ICD-10-CM

## 2022-11-12 DIAGNOSIS — I5031 Acute diastolic (congestive) heart failure: Secondary | ICD-10-CM

## 2022-11-12 DIAGNOSIS — I2510 Atherosclerotic heart disease of native coronary artery without angina pectoris: Secondary | ICD-10-CM | POA: Diagnosis not present

## 2022-11-12 MED ORDER — POTASSIUM CHLORIDE ER 10 MEQ PO TBCR: 10.0000 meq | EXTENDED_RELEASE_TABLET | Freq: Every day | ORAL | 3 refills | Status: AC

## 2022-11-12 MED ORDER — FUROSEMIDE 20 MG PO TABS: ORAL_TABLET | ORAL | Status: AC

## 2022-11-12 NOTE — Progress Notes (Unsigned)
   Cardiology Office Note:    Date:  11/12/2022   ID:  Alec Johnson, DOB December 24, 1950, MRN [id number]  PCP:  Macy Mis, MD  Cardiologist:  Olga Millers, MD { Click to update primary MD,subspecialty MD or APP then REFRESH:1}    Referring MD: Macy Mis, MD   Chief Complaint: ***  History of Present Illness:    Alec Johnson is a 72 y.o. male with a history of CAD with inferior STEMI in 09/2017 s/p DES to ostial ramus, severe LVH, bradycardia, hypertension, hyperlipidemia, GERD, and erectile dysfunction who is followed by Dr. Jens Som and presents today for ***.  Patient was admitted in 09/2017 for acute inferior STEMI. LHC showed CTO of mid RCA with left to right collaterals, 100% stenosis of ostial ramus (culprit lesion), and 25% stenosis of proximal to mid LCX. He underwent successful PCI with DES to ramus lesion. Echo at that time showed LVEF of 65-70% with mild LVH and grade 2 diastolic dysfunction. He was readmitted for chest pain in 12/2019. Echo at that time showed LVEF of 60-65% with moderate LVH, normal RV size and function, and mild dilatation of the ascending aorta measuring 36 mm. Myoview showed findings consistent with prior MI but no ischemia.   EKGs/Labs/Other Studies Reviewed:    The following studies were reviewed today: ***  EKG:  EKG ordered today.   EKG Interpretation Date/Time:  Wednesday November 12 2022 15:49:27 EDT Ventricular Rate:  48 PR Interval:  122 QRS Duration:  96 QT Interval:  482 QTC Calculation: 430 R Axis:   -27  Text Interpretation: Sinus bradycardia Minimal voltage criteria for LVH, may be normal variant ( R in aVL ) Non-specific T wave changes No significant changes compared to prior tracings Confirmed by Marjie Skiff [phone]) on 11/12/2022 3:56:00 PM    Recent Labs: 01/31/2022: BNP 108.9 02/24/2022: BUN 20; Creatinine, Ser 0.91; Hemoglobin 13.9; Platelets 176; Potassium 4.1; Sodium 144  Recent Lipid Panel    Component Value  Date/Time   CHOL 84 12/28/2019 0300   CHOL 100 03/30/2019 0902   TRIG 43 12/28/2019 0300   HDL 33 (L) 12/28/2019 0300   HDL 40 03/30/2019 0902   CHOLHDL 2.5 12/28/2019 0300   VLDL 9 12/28/2019 0300   LDLCALC 42 12/28/2019 0300   LDLCALC 50 03/30/2019 0902    Physical Exam:    Vital Signs: There were no vitals taken for this visit.    Wt Readings from Last 3 Encounters:  08/27/22 233 lb (105.7 kg)  01/31/22 237 lb (107.5 kg)  09/04/21 239 lb 12.8 oz (108.8 kg)     General: 71 y.o. Caucasian  male in no acute distress. HEENT: Normocephalic and atraumatic. Sclera clear.  Neck: Supple. No carotid bruits. No JVD. Heart: Bradycardic with normal rhythm. D No murmurs, gallops, or rubs.  Lungs: No increased work of breathing. Clear to ausculation bilaterally. No wheezes, rhonchi, or rales.  Extremities: Trace to 1+ pitting edema of bilateral lower extremities.  Skin: Warm and dry. Neuro: No focal deficits. Psych: Normal affect. Responds appropriately.   Assessment:    No diagnosis found.  Plan:     Disposition: Follow up in ***   Signed, Alec Parker, PA-C  11/12/2022 12:11 PM    [facility]

## 2022-11-12 NOTE — Patient Instructions (Signed)
Medication Instructions:  FUROSEMIDE(LASIX) : 20MG  x5 DAYS THEN Take 1 tablet (20mg ) daily by mouth as needed for swelling or with weight gain of 3 pounds overnight or 5 pounds in a week.,  POTASSIUM WHEN YOU TAKE YOUR FUROSEMIDE(LASIX) *If you need a refill on your cardiac medications before your next appointment, please call your pharmacy*  Lab Work: NONE If you have labs (blood work) drawn today and your tests are completely normal, you will receive your results only by: MyChart Message (if you have MyChart) OR A paper copy in the mail If you have any lab test that is abnormal or we need to change your treatment, we will call you to review the results.    Follow-Up: At [facility], you and your health needs are our priority.  As part of our continuing mission to provide you with exceptional heart care, we have created designated Provider Care Teams.  These Care Teams include your primary Cardiologist (physician) and Advanced Practice Providers (APPs -  Physician Assistants and Nurse Practitioners) who all work together to provide you with the care you need, when you need it.  We recommend signing up for the patient portal called "MyChart".  Sign up information is provided on this After Visit Summary.  MyChart is used to connect with patients for Virtual Visits (Telemedicine).  Patients are able to view lab/test results, encounter notes, upcoming appointments, etc.  Non-urgent messages can be sent to your provider as well.   To learn more about what you can do with MyChart, go to [email or web address].    Your next appointment:   KEEP SCHEDULED APPOINTMENT   Provider:   Marjie Skiff, PA-C

## 2022-11-13 ENCOUNTER — Encounter: Payer: Self-pay | Admitting: Student

## 2022-11-13 NOTE — Progress Notes (Signed)
Cardiology Office Note:    Date:  11/18/2022   ID:  Alec Johnson, DOB 1950-10-23, MRN [id number]  PCP:  Alec Mis, MD  Cardiologist:  Alec Millers, MD     Referring MD: Alec Mis, MD   Chief Complaint: follow-up of CHF  History of Present Illness:    Alec Johnson is a 72 y.o. male with a history of CAD with inferior STEMI in 09/2017 s/p DES to ostial ramus, severe LVH with chronic diastolic CHF, sinus bradycardia, dilated ascending thoracic aorta, hypertension, hyperlipidemia, GERD, and erectile dysfunction who is followed by Dr. Jens Johnson and presents today for follow-up of CHF.  Patient was admitted in 09/2017 for acute inferior STEMI. LHC showed CTO of mid RCA with left to right collaterals, 100% stenosis of ostial ramus (culprit lesion), and 25% stenosis of proximal to mid LCX. He underwent successful PCI with DES to ramus lesion. Echo at that time showed LVEF of 65-70% with mild LVH and grade 2 diastolic dysfunction. He was readmitted for chest pain in 12/2019. Echo at that time showed LVEF of 60-65% with moderate LVH, normal RV size and function, and mild dilatation of the ascending aorta measuring 36 mm. Myoview showed findings consistent with prior MI but no ischemia. At visit wit Alec Person, NP, in 01/2022 , he reported some dyspnea on exertion and bilateral lower extremity edema. BNP was only slightly elevated at 108.9. Echo was ordered and showed LVEF of 60-65% with normal wall motion but severe LVH, normal RV size, and function, moderately dilated atrium, and no significant valvular disease. Cardiac MRI was recommended for further evaluation of LVH and showed normal LV size with moderate hypertrophy and low normal systolic function (50%) and normal RV size and function (52%) as well as midwall LGE in basal inferolateral and apical inferior walls, which is a nonischemic scar pattern. Differential included prior myocarditis or sarcoidosis. Location of basal inferolateral LGE  is often seen in myocarditis, though multifocal LGE raises concern for sarcoid. However, he was not felt to have sarcoid. CT showed no adenopathy to suggest sarcoid and he has had no conduction abnormalities noted on EKG. Therefore, PET scan was not felt to be necessary.   He was recently seen by me on 11/12/2022 at which time he reported shortness of breath and chest tightness for the past 2 weeks after being diagnosed with bronchitis. He also described some new orthopnea and lower extremity edema. His PCP had recently checked a BNP which came back mildly elevated st 144. Symptoms were felt to be consistent with CHF and chest tightness was felt to be secondary to volume overload. He was instructed to take Lasix for 5 days and then as needed for weight gain and lower extremity edema.   Patient presents today for follow-up. He reports feeling much better after the 5 days of Lasix. He states he shortness of breath and chest tightness have completely resolved. He has been active doing yard work and playing 18 holes of golf with no exertional symptoms. No orthopnea or PND. His lower extremity edema has mostly resolved. He has a history of varicose veins so will intermittently get some lower extremity edema. However, this is typically well controlled with compression stockings. Weight is stable. No palpitations lightheadedness, dizziness, or syncope.    EKGs/Labs/Other Studies Reviewed:    The following studies were reviewed:  Left Cardiac Catheterization 10/19/2017: Prox Cx to Mid Cx lesion is 25% stenosed. Mid RCA lesion is 100% stenosed. CTO with  left to right collaterals. RCA difficult to engage from right radial approach. The left ventricular systolic function is normal. LV end diastolic pressure is normal. The left ventricular ejection fraction is 55-65% by visual estimate. There is no aortic valve stenosis. Ost Ramus to Ramus lesion is 100% stenosed. Culprit for STEMI. A drug-eluting stent was  successfully placed using a STENT SYNERGY DES 2.75X16. Post intervention, there is a 0% residual stenosis.     Recommend uninterrupted dual antiplatelet therapy with Aspirin 81mg  daily and Ticagrelor 90mg  twice daily for a minimum of 12 months (ACS - Class I recommendation).    Diagnostic Dominance: Right  Intervention         _______________  Echocardiogram 02/12/2022: Impressions: 1. Left ventricular ejection fraction, by estimation, is 60 to 65%. The  left ventricle has normal function. The left ventricle has no regional  wall motion abnormalities. There is severe concentric left ventricular  hypertrophy. Left ventricular diastolic   parameters are indeterminate.   2. Right ventricular systolic function is normal. The right ventricular  size is normal.   3. Left atrial size was moderately dilated.   4. The mitral valve is normal in structure. No evidence of mitral valve  regurgitation. No evidence of mitral stenosis.   5. The aortic valve is normal in structure. Aortic valve regurgitation is  not visualized. Aortic valve sclerosis is present, with no evidence of  aortic valve stenosis.   6. The inferior vena cava is normal in size with greater than 50%  respiratory variability, suggesting right atrial pressure of 3 mmHg.  _______________  Cardiac MRI 03/13/2022: Impressions: 1. Normal LV size, moderate hypertrophy, and low normal systolic function (EF 50%) 2.  Normal RV size and systolic function (EF 52%) 3. Midwall LGE in basal inferolateral and apical inferior walls, which is a nonischemic scar pattern. Differential includes prior myocarditis or sarcoidosis. Location of basal inferolateral LGE is often seen in myocarditis, though multifocal LGE raises concern for sarcoid. If clinical suspicion for sarcoid, consider cardiac PET for further evaluation 4.  Dilated ascending aorta measuring 40mm   EKG:  EKG not ordered today.   Recent Labs: 01/31/2022: BNP  108.9 02/24/2022: BUN 20; Creatinine, Ser 0.91; Hemoglobin 13.9; Platelets 176; Potassium 4.1; Sodium 144  Recent Lipid Panel    Component Value Date/Time   CHOL 84 12/28/2019 0300   CHOL 100 03/30/2019 0902   TRIG 43 12/28/2019 0300   HDL 33 (L) 12/28/2019 0300   HDL 40 03/30/2019 0902   CHOLHDL 2.5 12/28/2019 0300   VLDL 9 12/28/2019 0300   LDLCALC 42 12/28/2019 0300   LDLCALC 50 03/30/2019 0902    Physical Exam:    Vital Signs: BP (!) 142/78   Pulse (!) 57   Ht 5' 9.5" (1.765 m)   Wt 225 lb (102.1 kg)   SpO2 95%   BMI 32.75 kg/m     Wt Readings from Last 3 Encounters:  11/18/22 225 lb (102.1 kg)  11/12/22 226 lb 9.6 oz (102.8 kg)  08/27/22 233 lb (105.7 kg)     General: 72 y.o. Caucasian male in no acute distress. HEENT: Normocephalic and atraumatic. Sclera clear.  Neck: Supple.  No JVD. Heart: Borderline bradycardic today with normal rhythm. Distinct S1 and S2. No murmurs, gallops, or rubs.  Lungs: No increased work of breathing. Clear to ausculation bilaterally. No wheezes, rhonchi, or rales.  Extremities: No lower extremity edema.   Skin: Warm and dry. Neuro: No focal deficits. Psych: Normal affect. Responds appropriately.  Assessment:    1. Chronic diastolic CHF (congestive heart failure) (HCC)   2. Coronary artery disease involving native coronary artery of native heart without angina pectoris   3. Sinus bradycardia   4. Ascending aorta dilatation (HCC)   5. Primary hypertension   6. Hyperlipidemia, unspecified hyperlipidemia type     Plan:    Chronic Diastolic CHF Echo in 01/2022 showed LVEF of 60-65% with normal wall motion but severe LVH, normal RV size, and function, moderately dilated atrium, and no significant valvular disease. Cardiac MRI in 02/2022 showed normal LV size with moderate hypertrophy and low normal systolic function (50%) and normal RV size and function (52%) as well as midwall LGE in basal inferolateral and apical inferior walls, which  is a nonischemic scar pattern. Differential included prior myocarditis or sarcoidosis. Location of basal inferolateral LGE is often seen in myocarditis, though multifocal LGE raises concern for sarcoid. However, he was not felt to have sarcoid. CT showed no adenopathy to suggest sarcoid and he has had no conduction abnormalities noted on EKG. Therefore, PET scan was not felt to be necessary. He was seen in the office last week with reports of shortness of breath, orthopnea, and chest tightness for 2 weeks. BNP was 144 and he was felt to be mildly volume overloaded. He was started on 5 day course of Lasix.  - Symptoms completely resolved with Lasix. - Continue Lasix 20mg  as needed for weight gain and lower extremity edema. Will also prescribe KCl 10 mEq for patient to take when he takes Lasix. - Discussed importance of daily weights and sodium/ fluid restrictions.  - Will repeat BMET today.    CAD Patient has a history of an inferior STEMI in 09/2017 s/p DES to ostial ramus. Cath at that time also showed CTO of mid RCA with collaterals which was treated medically.  - Chest tightness has completely resolved with Lasix. He denies any other chest pain. - Continue aspirin and high intensity statin.  - Suspect chest tightness was due to volume overload. No additional ischemic work-up necessary at this time.    Sinus Bradycardia Patient has a history of sinus bradycardia.  - Rates in the the high 50s today. - Completely asymptotic.  - Continue to avoid AV nodal agents.  - No indication for PPM at this time.   Dilated Ascending Thoracic Aortic Aneurysm Cardiac MRI in 02/2022 showed dilated ascending aorta measuring 40mm.  - Can plan to monitor this with repeat Echo in the future. Can order at next visit.   Hypertension BP mildly elevated. Initially 142/78 and then 142/70 on my personal recheck at the end of visit. However, it looks like BP is usually well controlled at other office visits.  - Continue  Lisinopril 10mg  daily.  - Asked patient to keep a log of his BP/HR for 2 weeks and then send this to [place]. If BP consistently above 130/80, will likely just increase Lisinopril to 20mg  daily.   Hyperlipidemia LDL 42 in 08/2022 (per scanned labs from PCP's office). - Continue Lipitor 80mg  daily and Zetia 10mg  daily.   Disposition: Follow up around 08/2023 as previously recommended by Dr. Jens Johnson.   Signed, Corrin Parker, PA-C  11/18/2022 12:46 PM    [facility]

## 2022-11-18 ENCOUNTER — Ambulatory Visit: Payer: Medicare Other | Attending: Student | Admitting: Student

## 2022-11-18 ENCOUNTER — Encounter: Payer: Self-pay | Admitting: Student

## 2022-11-18 VITALS — BP 142/78 | HR 57 | Ht 69.5 in | Wt 225.0 lb

## 2022-11-18 DIAGNOSIS — E785 Hyperlipidemia, unspecified: Secondary | ICD-10-CM

## 2022-11-18 DIAGNOSIS — I5032 Chronic diastolic (congestive) heart failure: Secondary | ICD-10-CM | POA: Diagnosis not present

## 2022-11-18 DIAGNOSIS — I77810 Thoracic aortic ectasia: Secondary | ICD-10-CM

## 2022-11-18 DIAGNOSIS — I2510 Atherosclerotic heart disease of native coronary artery without angina pectoris: Secondary | ICD-10-CM

## 2022-11-18 DIAGNOSIS — I10 Essential (primary) hypertension: Secondary | ICD-10-CM

## 2022-11-18 DIAGNOSIS — R001 Bradycardia, unspecified: Secondary | ICD-10-CM

## 2022-11-18 NOTE — Patient Instructions (Signed)
Medication Instructions:  No Changes *If you need a refill on your cardiac medications before your next appointment, please call your pharmacy*   Lab Work: BMET If you have labs (blood work) drawn today and your tests are completely normal, you will receive your results only by: MyChart Message (if you have MyChart) OR A paper copy in the mail If you have any lab test that is abnormal or we need to change your treatment, we will call you to review the results.   Testing/Procedures: No Testing   Follow-Up: At [facility], you and your health needs are our priority.  As part of our continuing mission to provide you with exceptional heart care, we have created designated Provider Care Teams.  These Care Teams include your primary Cardiologist (physician) and Advanced Practice Providers (APPs -  Physician Assistants and Nurse Practitioners) who all work together to provide you with the care you need, when you need it.  We recommend signing up for the patient portal called "MyChart".  Sign up information is provided on this After Visit Summary.  MyChart is used to connect with patients for Virtual Visits (Telemedicine).  Patients are able to view lab/test results, encounter notes, upcoming appointments, etc.  Non-urgent messages can be sent to your provider as well.   To learn more about what you can do with MyChart, go to [email or web address].    Your next appointment:   10 month(s)  Provider:   Olga Millers, MD

## 2022-11-19 LAB — BASIC METABOLIC PANEL
BUN/Creatinine Ratio: 24 (ref 10–24)
BUN: 21 mg/dL (ref 8–27)
CO2: 27 mmol/L (ref 20–29)
Calcium: 9.3 mg/dL (ref 8.6–10.2)
Chloride: 103 mmol/L (ref 96–106)
Creatinine, Ser: 0.89 mg/dL (ref 0.76–1.27)
Glucose: 106 mg/dL — ABNORMAL HIGH (ref 70–99)
Potassium: 4.8 mmol/L (ref 3.5–5.2)
Sodium: 141 mmol/L (ref 134–144)
eGFR: 90 mL/min/{1.73_m2} (ref >59)

## 2022-12-02 ENCOUNTER — Telehealth: Payer: Self-pay | Admitting: Student

## 2022-12-02 DIAGNOSIS — Z79899 Other long term (current) drug therapy: Secondary | ICD-10-CM

## 2022-12-02 DIAGNOSIS — I2510 Atherosclerotic heart disease of native coronary artery without angina pectoris: Secondary | ICD-10-CM

## 2022-12-02 DIAGNOSIS — E785 Hyperlipidemia, unspecified: Secondary | ICD-10-CM

## 2022-12-02 NOTE — Telephone Encounter (Signed)
Patient dropped of bp report In providers box

## 2022-12-12 NOTE — Telephone Encounter (Signed)
Follow Up:    Patient said he had dropped off his blood readings as Callie had instructed him, but he said he had not heard from anyone.He said he was waiting to see if his blood pressure medicine need to be changed.

## 2022-12-14 NOTE — Telephone Encounter (Signed)
Sorry for the delayed response. Yes, I did get a chance to look at his BP log. His average BP is above goal. Let's increase his Lisinopril to 20mg  daily. He would like a 90 day prescription to be sent to [facility]. He can just take two of his 10mg  tablets until the new prescription arrives. Can you please notify the Pharmacy that this will replace his Lisinopril 10mg  daily prescription?   He will need a repeat BMET in 1 week after increasing the dose?  Patient should let us know if BP is consistently staying above 130/80 after making this change.  Thank you! Lindia Garms

## 2022-12-15 MED ORDER — LISINOPRIL 20 MG PO TABS: ORAL_TABLET | ORAL | 3 refills | Status: DC

## 2022-12-15 NOTE — Telephone Encounter (Signed)
Pt informed of providers result & recommendations. Pt verbalized understanding. All questions, if any, were answered. REFILL SENT TO MAIL IN PHARMACY, FUTURE LAB ENTERED

## 2022-12-24 ENCOUNTER — Telehealth: Payer: Self-pay | Admitting: Student

## 2022-12-24 ENCOUNTER — Other Ambulatory Visit: Payer: Self-pay

## 2022-12-24 DIAGNOSIS — Z79899 Other long term (current) drug therapy: Secondary | ICD-10-CM

## 2022-12-24 DIAGNOSIS — I2510 Atherosclerotic heart disease of native coronary artery without angina pectoris: Secondary | ICD-10-CM

## 2022-12-24 DIAGNOSIS — E785 Hyperlipidemia, unspecified: Secondary | ICD-10-CM

## 2022-12-24 NOTE — Telephone Encounter (Signed)
Left message to call back. RECEIVED BP LOG, ARE THESE BP's AFTER TAKING BP MEDICATION?

## 2022-12-24 NOTE — Telephone Encounter (Signed)
Patient dropped off bp report when he came in to get bloodwork done In providers box

## 2022-12-24 NOTE — Telephone Encounter (Signed)
Pt returning a call to nurse. °

## 2022-12-25 LAB — BASIC METABOLIC PANEL
BUN/Creatinine Ratio: 18 (ref 10–24)
BUN: 18 mg/dL (ref 8–27)
CO2: 29 mmol/L (ref 20–29)
Calcium: 9.3 mg/dL (ref 8.6–10.2)
Chloride: 104 mmol/L (ref 96–106)
Creatinine, Ser: 0.98 mg/dL (ref 0.76–1.27)
Glucose: 91 mg/dL (ref 70–99)
Potassium: 4.3 mmol/L (ref 3.5–5.2)
Sodium: 142 mmol/L (ref 134–144)
eGFR: 81 mL/min/{1.73_m2} (ref >59)

## 2022-12-26 ENCOUNTER — Other Ambulatory Visit: Payer: Self-pay

## 2022-12-26 DIAGNOSIS — I10 Essential (primary) hypertension: Secondary | ICD-10-CM

## 2022-12-26 DIAGNOSIS — I2510 Atherosclerotic heart disease of native coronary artery without angina pectoris: Secondary | ICD-10-CM

## 2022-12-26 DIAGNOSIS — Z79899 Other long term (current) drug therapy: Secondary | ICD-10-CM

## 2022-12-26 MED ORDER — LISINOPRIL 40 MG PO TABS: 40.0000 mg | ORAL_TABLET | Freq: Every day | ORAL | 2 refills | Status: DC

## 2023-01-09 ENCOUNTER — Other Ambulatory Visit: Payer: Self-pay

## 2023-01-09 DIAGNOSIS — I10 Essential (primary) hypertension: Secondary | ICD-10-CM

## 2023-01-09 DIAGNOSIS — I2510 Atherosclerotic heart disease of native coronary artery without angina pectoris: Secondary | ICD-10-CM

## 2023-01-09 DIAGNOSIS — Z79899 Other long term (current) drug therapy: Secondary | ICD-10-CM

## 2023-01-10 LAB — BASIC METABOLIC PANEL
BUN/Creatinine Ratio: 23 (ref 10–24)
BUN: 23 mg/dL (ref 8–27)
CO2: 25 mmol/L (ref 20–29)
Calcium: 8.9 mg/dL (ref 8.6–10.2)
Chloride: 106 mmol/L (ref 96–106)
Creatinine, Ser: 0.98 mg/dL (ref 0.76–1.27)
Glucose: 106 mg/dL — ABNORMAL HIGH (ref 70–99)
Potassium: 4.3 mmol/L (ref 3.5–5.2)
Sodium: 144 mmol/L (ref 134–144)
eGFR: 81 mL/min/{1.73_m2} (ref >59)

## 2023-01-12 ENCOUNTER — Other Ambulatory Visit: Payer: Self-pay | Admitting: Student

## 2023-01-12 DIAGNOSIS — I10 Essential (primary) hypertension: Secondary | ICD-10-CM

## 2023-01-12 NOTE — Telephone Encounter (Signed)
   Reviewed home BP/ HR log. BP from 12/28/2022 to 01/09/2023 ranged from 117-169/62-81 but systolic BP mostly in the 140s to 150s over the last week. HR ranged from 44 to 64 bpm. Let's start Spironolactone 25mg  once daily. Let's have him take the Spironolactone in the morning and the Lisinopril at night. Will then need another BMET in 1 week to make sure kidney function, and potassium are stable on Spironolactone.   He should continue to monitor BP at home and let us know if BP remains consistently >130/80 after adding the Spironolactone.   Marcelino Duster, can you please call patient and notify him of all this?  Thank you! Lessly Stigler

## 2023-01-12 NOTE — Telephone Encounter (Signed)
Left message to call back.  Future order and med ordered

## 2023-01-12 NOTE — Addendum Note (Signed)
Addended by: Alyson Ingles on: 01/12/2023 08:40 AM   Modules accepted: Orders

## 2023-01-19 ENCOUNTER — Ambulatory Visit (HOSPITAL_BASED_OUTPATIENT_CLINIC_OR_DEPARTMENT_OTHER)
Admission: RE | Admit: 2023-01-19 | Discharge: 2023-01-19 | Disposition: A | Discharge status: Home / Self Care | Payer: Medicare Other | Source: Ambulatory Visit | Attending: Acute Care | Admitting: Acute Care

## 2023-01-19 ENCOUNTER — Ambulatory Visit (HOSPITAL_BASED_OUTPATIENT_CLINIC_OR_DEPARTMENT_OTHER): Payer: Medicare Other

## 2023-01-19 DIAGNOSIS — Z87891 Personal history of nicotine dependence: Secondary | ICD-10-CM | POA: Diagnosis present

## 2023-01-19 DIAGNOSIS — Z122 Encounter for screening for malignant neoplasm of respiratory organs: Secondary | ICD-10-CM | POA: Insufficient documentation

## 2023-01-19 MED ORDER — SPIRONOLACTONE 25 MG PO TABS: 25.0000 mg | ORAL_TABLET | Freq: Every day | ORAL | 3 refills | Status: DC

## 2023-01-19 NOTE — Addendum Note (Signed)
Addended by: Alyson Ingles on: 01/19/2023 04:25 PM   Modules accepted: Orders

## 2023-01-19 NOTE — Telephone Encounter (Signed)
Pt informed of providers result & recommendations. Pt verbalized understanding. New rx  and BMET entered   He will continue to take NP

## 2023-01-29 ENCOUNTER — Telehealth: Payer: Self-pay | Admitting: Student

## 2023-01-29 NOTE — Telephone Encounter (Signed)
 Paper Work Dropped Off: Blood Pressure Readings  Date: 01/29/2023  Location of paper:  [facility]

## 2023-01-29 NOTE — Telephone Encounter (Signed)
 GIVEN TO CALLIE GOODRICH, PA-C

## 2023-01-30 LAB — BASIC METABOLIC PANEL
BUN/Creatinine Ratio: 19 (ref 10–24)
BUN: 18 mg/dL (ref 8–27)
CO2: 25 mmol/L (ref 20–29)
Calcium: 9.5 mg/dL (ref 8.6–10.2)
Chloride: 105 mmol/L (ref 96–106)
Creatinine, Ser: 0.96 mg/dL (ref 0.76–1.27)
Glucose: 109 mg/dL — ABNORMAL HIGH (ref 70–99)
Potassium: 4.5 mmol/L (ref 3.5–5.2)
Sodium: 146 mmol/L — ABNORMAL HIGH (ref 134–144)
eGFR: 83 mL/min/{1.73_m2} (ref >59)

## 2023-02-02 ENCOUNTER — Other Ambulatory Visit: Payer: Self-pay

## 2023-02-02 DIAGNOSIS — Z87891 Personal history of nicotine dependence: Secondary | ICD-10-CM

## 2023-02-02 DIAGNOSIS — Z122 Encounter for screening for malignant neoplasm of respiratory organs: Secondary | ICD-10-CM

## 2023-02-04 ENCOUNTER — Telehealth: Payer: Self-pay | Admitting: Student

## 2023-02-04 DIAGNOSIS — I2510 Atherosclerotic heart disease of native coronary artery without angina pectoris: Secondary | ICD-10-CM

## 2023-02-04 DIAGNOSIS — G4733 Obstructive sleep apnea (adult) (pediatric): Secondary | ICD-10-CM

## 2023-02-04 DIAGNOSIS — I5031 Acute diastolic (congestive) heart failure: Secondary | ICD-10-CM

## 2023-02-04 DIAGNOSIS — I10 Essential (primary) hypertension: Secondary | ICD-10-CM

## 2023-02-04 DIAGNOSIS — I5032 Chronic diastolic (congestive) heart failure: Secondary | ICD-10-CM

## 2023-02-04 MED ORDER — OLMESARTAN MEDOXOMIL 40 MG PO TABS: 40.0000 mg | ORAL_TABLET | Freq: Every day | ORAL | 2 refills | Status: DC

## 2023-02-04 NOTE — Telephone Encounter (Signed)
   Reviewed home BP/HR log: - 1/1 (9 AM):   BP 180/80, HR 42 - 1/1 (5 PM):  BP 173/77, HR 41 - 1/2 (8:30 AM): BP 181/84, HR 44 - 1/2 (6 PM):  BP 141/79, HR 57 - 1/3 (8 AM):  BP 153/70, HR 55 - 1/3 (6 PM):  BP 148/72, HR 60 - 1/4 (8 AM):  BP 152/80, HR 48 - 1/4 (6 PM):  BP 150/76, HR 49 - 1/5 (9 AM):  BP 159/72, HR 44 - 1/5 (6:30 PM): BP 162/74, HR 52 - 1/6 (8:30 AM): BP 157/74, HR 52 - 1/6 (7 PM):  BP 146/68, HR 49 - 1/7 (8 AM):  BP 155/78, HR 46 - 1/7 (8 PM):  BP 162/73, HR 49 - 1/8 (7 AM):  BP 172/82, HR 43 - 1/8 (9 PM):  BP 166/73, HR 45 - 1/9 (8 AM):   BP 171/72, HR 50  Called and discussed with patient. He typically takes Lisinopril  around 7am and then take Spironolactone  around 5pm. We discussed starting Amlodipine  vs stop Lisinopril  and starting Olmesartan  instead. He would rather stop Lisinopril  and start Olmesartan  so this is what we will do. Will start Olmesartan  40mg  daily tomorrow. Will repeat a BMET in 1-2 weeks. He will drop of a new BP/HR log at that time.   Discussed sleep study for evaluation of sleep apnea given difficult to control hypertension. He does snore but he denies any apneic episodes or daytime somnolence. STOP Bang score = 4 suggesting he is at intermediate risk for moderate to severe OSA. He is interest in the [place] home sleep study but wanted to know how much this would cost. Will see if we can check on this.  Review medication list. He takes Meloxicam 15mg  daily for arthritis which he has done for years. Discussed that Meloxicam is a NSAID which can increase risk of MI and CVA and can make BP hard to control. Recommend talking with Rheumatologist and discussing different treatment options for this.  Harsimran Westman E Estle Sabella, PA-C 02/04/2023 3:44 PM

## 2023-02-04 NOTE — Telephone Encounter (Signed)
 I'm not sure, will forward to sleep dept-high priority

## 2023-02-04 NOTE — Telephone Encounter (Signed)
 Please enter the order for the itamar, give the patient device and we will get it authorized so he can take the test.

## 2023-02-06 NOTE — Telephone Encounter (Signed)
Notified patient that PA for Itamar Device is Complete and it is a covered test with his [employer]. Notified patient that he can come in and pick up device as well as PIN given. Patient stated he I unsure if he wants to complete test and will reach back out to me directly if he decides to come pick up device.   Ordering provider: Irene Limbo Associated diagnoses: Essential Hypertension, CAD, Dyslipidemia WatchPAT PA obtained on 02/06/2023 by Brunetta Genera, LPN. Authorization: Yes; tracking ID  [id number]. Patient notified of PIN ([id number]) on 02/06/2023 via Notification Method: phone.

## 2023-02-06 NOTE — Addendum Note (Signed)
Addended by: Alyson Ingles on: 02/06/2023 09:47 AM   Modules accepted: Orders

## 2023-02-12 ENCOUNTER — Other Ambulatory Visit: Payer: Self-pay

## 2023-02-12 DIAGNOSIS — I10 Essential (primary) hypertension: Secondary | ICD-10-CM

## 2023-02-12 LAB — BASIC METABOLIC PANEL
BUN/Creatinine Ratio: 21 (ref 10–24)
BUN: 20 mg/dL (ref 8–27)
CO2: 25 mmol/L (ref 20–29)
Calcium: 9.2 mg/dL (ref 8.6–10.2)
Chloride: 103 mmol/L (ref 96–106)
Creatinine, Ser: 0.95 mg/dL (ref 0.76–1.27)
Glucose: 126 mg/dL — ABNORMAL HIGH (ref 70–99)
Potassium: 4.8 mmol/L (ref 3.5–5.2)
Sodium: 142 mmol/L (ref 134–144)
eGFR: 84 mL/min/{1.73_m2} (ref >59)

## 2023-02-18 ENCOUNTER — Other Ambulatory Visit: Payer: Self-pay | Admitting: Cardiology

## 2023-03-23 ENCOUNTER — Telehealth: Payer: Self-pay | Admitting: Cardiology

## 2023-03-23 MED ORDER — OLMESARTAN MEDOXOMIL 40 MG PO TABS: 40.0000 mg | ORAL_TABLET | Freq: Every day | ORAL | 1 refills | Status: DC

## 2023-03-23 MED ORDER — SPIRONOLACTONE 25 MG PO TABS: 25.0000 mg | ORAL_TABLET | Freq: Every day | ORAL | 1 refills | Status: DC

## 2023-03-23 NOTE — Telephone Encounter (Signed)
 Patient identification verified by 2 forms. Marilynn Rail, RN    Called and spoke to patient  Informed patient:  -refill sent for spironolactone and olmesartan 90x1 refill   -follow up visit due 08/2023 -call In April to request follow up appointment  Patient verbalized understanding, no questions at this time

## 2023-03-23 NOTE — Telephone Encounter (Signed)
 Pt c/o medication issue:  1. Name of Medication:   spironolactone (ALDACTONE) 25 MG tablet  olmesartan (BENICAR) 40 MG tablet   2. How are you currently taking this medication (dosage and times per day)?   As prescribed  3. Are you having a reaction (difficulty breathing--STAT)?   No  4. What is your medication issue?   Patient wants these medications prescribed for a year and sent in 90 day increments to  [facility]) - [place], [place] - [address] [place].

## 2023-03-31 ENCOUNTER — Other Ambulatory Visit: Payer: Self-pay | Admitting: Cardiology

## 2023-03-31 DIAGNOSIS — E785 Hyperlipidemia, unspecified: Secondary | ICD-10-CM

## 2023-05-02 ENCOUNTER — Other Ambulatory Visit: Payer: Self-pay | Admitting: Student

## 2023-05-12 ENCOUNTER — Other Ambulatory Visit: Payer: Self-pay | Admitting: Student

## 2023-06-02 ENCOUNTER — Other Ambulatory Visit: Payer: Self-pay | Admitting: Student

## 2023-06-29 ENCOUNTER — Telehealth: Payer: Self-pay | Admitting: Cardiology

## 2023-06-29 NOTE — Telephone Encounter (Signed)
 Pt c/o medication issue:  1. Name of Medication:   spironolactone  (ALDACTONE ) 25 MG tablet (Expired)    2. How are you currently taking this medication (dosage and times per day)?   3. Are you having a reaction (difficulty breathing--STAT)? no  4. What is your medication issue? Patient states that he is having a reaction to the medication. Please advise

## 2023-06-29 NOTE — Telephone Encounter (Signed)
 Returned call to pt. Patient states when she started on the medication he started having little pains under his nipples. He has seen PCP and had testing. His PCP told him this medication is likely the cause. Recent Bps include 119/58  115/54. Advised pt will send over for review to switch medications.

## 2023-07-01 NOTE — Telephone Encounter (Signed)
 Patient returned RN Debra's call.

## 2023-07-01 NOTE — Telephone Encounter (Signed)
 Spoke with pt, aware of dr Lourdes Roy recommendations. He will track his blood pressure and let us  know how it is doing.

## 2023-07-01 NOTE — Telephone Encounter (Signed)
 Left message for pt to call.

## 2023-07-17 ENCOUNTER — Other Ambulatory Visit: Payer: Self-pay | Admitting: Student

## 2023-08-27 ENCOUNTER — Other Ambulatory Visit: Payer: Self-pay | Admitting: Cardiology

## 2023-09-03 ENCOUNTER — Ambulatory Visit: Admitting: Cardiology

## 2023-09-15 NOTE — Progress Notes (Signed)
 HPI: Follow-up CAD. Admitted September 2019 with acute infarct. Cardiac catheterization revealed an occluded RCA with left to right collaterals; 100% ostial ramus; normal LV function; PCI of ramus with drug-eluting stent. Abdominal ultrasound January 2020 showed no aneurysm. Nuclear study December 2021 showed ejection fraction 49%, prior apical infarct and no ischemia.  Echocardiogram January 2024 showed normal LV function, severe left ventricular hypertrophy, moderate left atrial enlargement.  Cardiac MRI February 2024 showed normal LV size, moderate left ventricular hypertrophy, ejection fraction 50%, possible prior myocarditis versus sarcoid and PET scan could be considered to further assess, dilated ascending aorta at 40 mm.  Since last seen the patient denies any dyspnea on exertion, orthopnea, PND, pedal edema, palpitations, syncope or chest pain.   Current Outpatient Medications  Medication Sig Dispense Refill   acetaminophen  (TYLENOL ) 500 MG tablet Take 1,000 mg by mouth every 6 (six) hours as needed for mild pain (pain score 1-3) or moderate pain (pain score 4-6).     aspirin  EC 81 MG tablet Take 1 tablet (81 mg total) by mouth daily. Swallow whole. 90 tablet 3   atorvastatin  (LIPITOR ) 80 MG tablet TAKE 1 TABLET BY MOUTH ONCE  DAILY 100 tablet 3   Cholecalciferol  (VITAMIN D3) 5000 units TABS Take 5,000 Units by mouth 3 (three) times a week.      ezetimibe  (ZETIA ) 10 MG tablet TAKE 1 TABLET BY MOUTH DAILY 100 tablet 2   fluticasone  (FLONASE ) 50 MCG/ACT nasal spray Place 2 sprays into both nostrils daily. 48 g 0   furosemide  (LASIX ) 20 MG tablet 20MG  x5 DAYS THEN Take 1 tablet (20mg ) daily by mouth as needed for swelling or with weight gain of 3 pounds overnight or 5 pounds in a week. (Patient taking differently: Take 20 mg by mouth as needed for fluid or edema. Take 1 tablet (20mg ) daily by mouth as needed for swelling or with weight gain of 3 pounds overnight or 5 pounds in a week.)      Glucosamine HCl (GLUCOSAMINE PO) Take 500 mg by mouth daily.     meloxicam (MOBIC) 15 MG tablet Take 15 mg by mouth daily.     olmesartan  (BENICAR ) 40 MG tablet TAKE 1 TABLET BY MOUTH DAILY  PLEASE KEEP SCHEDULED  APPOINTMENT FOR FUTURE REFILLS.  THANK YOU 90 tablet 0   potassium chloride  (KLOR-CON ) 10 MEQ tablet Take 1 tablet (10 mEq total) by mouth daily. WHEN YOU TAKE YOUR FUROSEMIDE  (LASIX ) 30 tablet 3   nitroGLYCERIN  (NITROSTAT ) 0.4 MG SL tablet Place 1 tablet (0.4 mg total) under the tongue every 5 (five) minutes x 3 doses as needed for chest pain. (Patient not taking: Reported on 09/28/2023) 25 tablet 1   No current facility-administered medications for this visit.     Past Medical History:  Diagnosis Date   Colon polyp    Diverticular disease    Erectile dysfunction    GERD (gastroesophageal reflux disease)    NO Recent problems   Hyperlipidemia    Osteoarthritis    Seborrheic keratosis    STEMI (ST elevation myocardial infarction) (HCC)    10/19/17 PCI/DES to ramus with CTO of the mRCA with collaterals, normal EF    Past Surgical History:  Procedure Laterality Date   athroscopic knee surgery     BIL KNEES   CORONARY/GRAFT ACUTE MI REVASCULARIZATION N/A 10/19/2017   Procedure: Coronary/Graft Acute MI Revascularization;  Surgeon: Dann Candyce RAMAN, MD;  Location: [facility] INVASIVE CV LAB;  Service: Cardiovascular;  Laterality: N/A;  LEFT HEART CATH AND CORONARY ANGIOGRAPHY N/A 10/19/2017   Procedure: LEFT HEART CATH AND CORONARY ANGIOGRAPHY;  Surgeon: Dann Candyce RAMAN, MD;  Location: [facility] INVASIVE CV LAB;  Service: Cardiovascular;  Laterality: N/A;   LEG SURGERY  1966   PINNING DUE TO INJURY   NOSE SURGERY     TOTAL KNEE ARTHROPLASTY Right 01/18/2014   Procedure: RIGHT TOTAL KNEE ARTHROPLASTY;  Surgeon: Tanda DELENA Heading, MD;  Location: [facility] ORS;  Service: Orthopedics;  Laterality: Right;    Social History   Socioeconomic History   Marital status: Married    Spouse name:  Rock   Number of children: 2   Years of education: 12   Highest education level: Not on file  Occupational History   Occupation: RETIRED     Employer: [employer]  Tobacco Use   Smoking status: Former    Current packs/day: 0.00    Average packs/day: 2.0 packs/day for 45.0 years (90.0 ttl pk-yrs)    Types: Cigarettes    Start date: 01/11/1967    Quit date: 01/11/2012    Years since quitting: 11.7   Smokeless tobacco: Never   Tobacco comments:    He has quit smoking.    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     Education:  [employer]    Tobacco Use/Exposure: He has quit smoking.        Alcohol Use: Occasional    Drug Use:  None   Diet:  Regular   Exercise:  He was walking daily until he hurt his hip.     Hobbies:  Fishing, Gardening, Golf    Social Drivers of Corporate investment banker Strain: Low Risk  (09/20/2023)   Received from [facility]   Overall Financial Resource Strain (CARDIA)    How hard is it for you to pay for the very basics like food, housing, medical care, and heating?: Not hard at all  Food Insecurity: No Food Insecurity (09/20/2023)   Received from [facility]   Hunger Vital Sign    Within the past 12 months, you worried that your food would run out before you got the money to buy more.: Never true    Within the past 12 months, the food you bought just didn't last and you didn't have money to get more.: Never true  Transportation Needs: No Transportation Needs (09/20/2023)   Received from [facility] - Transportation    In the past 12 months, has lack of transportation kept you from  medical appointments or from getting medications?: No    In the past 12 months, has lack of transportation kept you from meetings, work, or from getting things needed for daily living?: No  Physical Activity: Sufficiently Active (09/20/2023)   Received from [facility]   Exercise Vital Sign    On average, how many days per week do you engage in moderate to strenuous exercise (like a brisk walk)?: 3 days    On average, how many minutes do you engage in exercise at this level?: 120 min  Stress: No Stress Concern Present (09/20/2023)  Received from [facility] - Occupational Stress Questionnaire    Do you feel stress - tense, restless, nervous, or anxious, or unable to sleep at night because your mind is troubled all the time - these days?: Only a little  Social Connections: Socially Integrated (09/20/2023)   Received from [facility]   Social Network    How would you rate your social network (family, work, friends)?: Good participation with social networks  Intimate Partner Violence: Not At Risk (09/20/2023)   Received from [facility]   HITS    Over the last 12 months how often did your partner physically hurt you?: Never    Over the last 12 months how often did your partner insult you or talk down to you?: Never    Over the last 12 months how often did your partner threaten you with physical harm?: Never    Over the last 12 months how often did your partner scream or curse at you?: Never    Family History  Problem Relation Age of Onset   Coronary artery disease Brother        MI   Heart attack Brother    Stroke Mother    Cancer Father        Lung Cancer   Cancer Paternal Grandfather        Lung Cancer   Heart attack Paternal Grandfather     ROS: no fevers or chills, productive cough, hemoptysis, dysphasia, odynophagia, melena, hematochezia, dysuria, hematuria, rash, seizure activity, orthopnea, PND, pedal edema, claudication.  Remaining systems are negative.  Physical Exam: Well-developed well-nourished in no acute distress.  Skin is warm and dry.  HEENT is normal.  Neck is supple.  Chest is clear to auscultation with normal expansion.  Cardiovascular exam is regular rate and rhythm.  Abdominal exam nontender or distended. No masses palpated. Extremities show no edema. neuro grossly intact  EKG Interpretation Date/Time:  Monday September 28 2023 08:07:51 EDT Ventricular Rate:  48 PR Interval:  148 QRS Duration:  94 QT Interval:  454 QTC Calculation: 405 R Axis:   -15  Text Interpretation: Sinus bradycardia with marked sinus arrhythmia Minimal voltage criteria for LVH, may be normal variant ( R in aVL ) Confirmed by Pietro Rogue ([id number]) on 09/28/2023 8:10:39 AM    A/P  1 coronary artery disease-patient denies chest pain.  Continue medical therapy with aspirin  and statin.  2 hyperlipidemia-continue statin.  3 hypertension-patient's blood pressure is controlled.  Continue present medical regimen.  4 history of bradycardia-Will continue to avoid AV nodal blocking agents.  He has no history of syncope.  5 history of abnormal MRI-previous MRI suggestive of myocarditis versus sarcoid.  Previous CT showed no adenopathy to suggest sarcoid.  No conduction abnormalities noted on prior ECG.  I have not pursued a PET scan at this point.  6 chronic diastolic congestive heart failure-patient will continue Lasix  as needed.  He is euvolemic on examination.  7 history of mildly dilated ascending aorta-we will plan repeat echocardiogram.  Rogue Pietro, MD

## 2023-09-28 ENCOUNTER — Ambulatory Visit: Attending: Cardiology | Admitting: Cardiology

## 2023-09-28 ENCOUNTER — Encounter: Payer: Self-pay | Admitting: Cardiology

## 2023-09-28 VITALS — BP 127/77 | HR 48 | Ht 70.0 in | Wt 234.0 lb

## 2023-09-28 DIAGNOSIS — I2510 Atherosclerotic heart disease of native coronary artery without angina pectoris: Secondary | ICD-10-CM | POA: Diagnosis not present

## 2023-09-28 DIAGNOSIS — I77810 Thoracic aortic ectasia: Secondary | ICD-10-CM | POA: Diagnosis not present

## 2023-09-28 DIAGNOSIS — I5032 Chronic diastolic (congestive) heart failure: Secondary | ICD-10-CM

## 2023-09-28 DIAGNOSIS — I10 Essential (primary) hypertension: Secondary | ICD-10-CM

## 2023-09-28 DIAGNOSIS — E785 Hyperlipidemia, unspecified: Secondary | ICD-10-CM

## 2023-09-28 NOTE — Patient Instructions (Signed)
   Testing/Procedures:  Your physician has requested that you have an echocardiogram. Echocardiography is a painless test that uses sound waves to create images of your heart. It provides your doctor with information about the size and shape of your heart and how well your heart's chambers and valves are working. This procedure takes approximately one hour. There are no restrictions for this procedure. Please do NOT wear cologne, perfume, aftershave, or lotions (deodorant is allowed). Please arrive 15 minutes prior to your appointment time.  Please note: We ask at that you not bring children with you during ultrasound (echo/ vascular) testing. Due to room size and safety concerns, children are not allowed in the ultrasound rooms during exams. Our front office staff cannot provide observation of children in our lobby area while testing is being conducted. An adult accompanying a patient to their appointment will only be allowed in the ultrasound room at the discretion of the ultrasound technician under special circumstances. We apologize for any inconvenience. [facility]  Follow-Up: At [facility], you and your health needs are our priority.  As part of our continuing mission to provide you with exceptional heart care, our providers are all part of one team.  This team includes your primary Cardiologist (physician) and Advanced Practice Providers or APPs (Physician Assistants and Nurse Practitioners) who all work together to provide you with the care you need, when you need it.  Your next appointment:   12 month(s)  Provider:   Redell Shallow, MD

## 2023-10-29 ENCOUNTER — Ambulatory Visit (HOSPITAL_BASED_OUTPATIENT_CLINIC_OR_DEPARTMENT_OTHER)
Admission: RE | Admit: 2023-10-29 | Discharge: 2023-10-29 | Disposition: A | Discharge status: Home / Self Care | Source: Ambulatory Visit | Attending: Cardiology | Admitting: Cardiology

## 2023-10-29 ENCOUNTER — Ambulatory Visit: Payer: Self-pay | Admitting: Cardiology

## 2023-10-29 DIAGNOSIS — I252 Old myocardial infarction: Secondary | ICD-10-CM | POA: Insufficient documentation

## 2023-10-29 DIAGNOSIS — I77810 Thoracic aortic ectasia: Secondary | ICD-10-CM | POA: Insufficient documentation

## 2023-10-29 DIAGNOSIS — E785 Hyperlipidemia, unspecified: Secondary | ICD-10-CM | POA: Diagnosis not present

## 2023-10-29 DIAGNOSIS — R001 Bradycardia, unspecified: Secondary | ICD-10-CM | POA: Diagnosis not present

## 2023-10-29 DIAGNOSIS — Z87891 Personal history of nicotine dependence: Secondary | ICD-10-CM | POA: Diagnosis not present

## 2023-10-29 DIAGNOSIS — I340 Nonrheumatic mitral (valve) insufficiency: Secondary | ICD-10-CM | POA: Insufficient documentation

## 2023-10-29 DIAGNOSIS — I119 Hypertensive heart disease without heart failure: Secondary | ICD-10-CM | POA: Diagnosis not present

## 2023-10-29 DIAGNOSIS — I2510 Atherosclerotic heart disease of native coronary artery without angina pectoris: Secondary | ICD-10-CM | POA: Diagnosis not present

## 2023-10-29 LAB — ECHOCARDIOGRAM COMPLETE
AR max vel: 2.66 cm2
AV Area VTI: 2.95 cm2
AV Area mean vel: 2.98 cm2
AV Mean grad: 6 mmHg
AV Peak grad: 14.4 mmHg
AV Vena cont: 0.3 cm
Ao pk vel: 1.9 m/s
Area-P 1/2: 4.71 cm2
Calc EF: 63.3 %
MV M vel: 4.82 m/s
MV Peak grad: 92.9 mmHg
S' Lateral: 3.6 cm
Single Plane A2C EF: 60.9 %
Single Plane A4C EF: 67.6 %

## 2023-11-14 ENCOUNTER — Other Ambulatory Visit: Payer: Self-pay | Admitting: Cardiology

## 2024-01-20 ENCOUNTER — Ambulatory Visit (HOSPITAL_BASED_OUTPATIENT_CLINIC_OR_DEPARTMENT_OTHER)
Admission: RE | Admit: 2024-01-20 | Discharge: 2024-01-20 | Disposition: A | Discharge status: Home / Self Care | Source: Ambulatory Visit | Attending: Acute Care | Admitting: Acute Care

## 2024-01-20 DIAGNOSIS — Z87891 Personal history of nicotine dependence: Secondary | ICD-10-CM | POA: Diagnosis present

## 2024-01-20 DIAGNOSIS — Z122 Encounter for screening for malignant neoplasm of respiratory organs: Secondary | ICD-10-CM | POA: Insufficient documentation

## 2024-01-25 ENCOUNTER — Other Ambulatory Visit: Payer: Self-pay

## 2024-01-25 DIAGNOSIS — E785 Hyperlipidemia, unspecified: Secondary | ICD-10-CM

## 2024-01-27 MED ORDER — EZETIMIBE 10 MG PO TABS: 10.0000 mg | ORAL_TABLET | Freq: Every day | ORAL | 2 refills | Status: AC

## 2024-01-27 MED ORDER — OLMESARTAN MEDOXOMIL 40 MG PO TABS: 40.0000 mg | ORAL_TABLET | Freq: Every day | ORAL | 3 refills | Status: AC

## 2024-01-27 MED ORDER — ATORVASTATIN CALCIUM 80 MG PO TABS: 80.0000 mg | ORAL_TABLET | Freq: Every day | ORAL | 3 refills | Status: AC

## 2024-02-01 ENCOUNTER — Other Ambulatory Visit: Payer: Self-pay

## 2024-02-01 DIAGNOSIS — Z87891 Personal history of nicotine dependence: Secondary | ICD-10-CM

## 2024-02-01 DIAGNOSIS — Z122 Encounter for screening for malignant neoplasm of respiratory organs: Secondary | ICD-10-CM
